# Patient Record
Sex: Female | Born: 1975
Health system: Southern US, Community
[De-identification: ages and names within clinical notes are randomized; demographics above are authoritative.]

## PROBLEM LIST (undated history)

## (undated) DIAGNOSIS — G43909 Migraine, unspecified, not intractable, without status migrainosus: Secondary | ICD-10-CM

## (undated) DIAGNOSIS — D219 Benign neoplasm of connective and other soft tissue, unspecified: Secondary | ICD-10-CM

## (undated) DIAGNOSIS — D649 Anemia, unspecified: Secondary | ICD-10-CM

## (undated) DIAGNOSIS — I10 Essential (primary) hypertension: Secondary | ICD-10-CM

## (undated) DIAGNOSIS — K219 Gastro-esophageal reflux disease without esophagitis: Secondary | ICD-10-CM

## (undated) HISTORY — DX: Gastro-esophageal reflux disease without esophagitis: K21.9

## (undated) HISTORY — PX: KNEE SURGERY: SHX244

## (undated) HISTORY — DX: Essential (primary) hypertension: I10

## (undated) HISTORY — DX: Anemia, unspecified: D64.9

## (undated) HISTORY — DX: Benign neoplasm of connective and other soft tissue, unspecified: D21.9

---

## 1997-12-02 ENCOUNTER — Encounter: Admission: RE | Admit: 1997-12-02 | Discharge: 1997-12-02 | Payer: Self-pay | Admitting: Family Medicine

## 1997-12-17 ENCOUNTER — Encounter: Admission: RE | Admit: 1997-12-17 | Discharge: 1997-12-17 | Payer: Self-pay | Admitting: Family Medicine

## 1998-01-02 ENCOUNTER — Encounter: Admission: RE | Admit: 1998-01-02 | Discharge: 1998-01-02 | Payer: Self-pay | Admitting: Family Medicine

## 1998-02-04 ENCOUNTER — Encounter: Admission: RE | Admit: 1998-02-04 | Discharge: 1998-02-04 | Payer: Self-pay | Admitting: Family Medicine

## 1998-02-06 ENCOUNTER — Encounter: Admission: RE | Admit: 1998-02-06 | Discharge: 1998-02-06 | Payer: Self-pay | Admitting: Family Medicine

## 1998-06-29 ENCOUNTER — Other Ambulatory Visit: Admission: RE | Admit: 1998-06-29 | Discharge: 1998-06-29 | Payer: Self-pay

## 1998-06-29 ENCOUNTER — Encounter: Admission: RE | Admit: 1998-06-29 | Discharge: 1998-06-29 | Payer: Self-pay | Admitting: Family Medicine

## 1998-07-09 ENCOUNTER — Encounter: Admission: RE | Admit: 1998-07-09 | Discharge: 1998-07-09 | Payer: Self-pay | Admitting: Sports Medicine

## 1998-10-30 ENCOUNTER — Emergency Department (HOSPITAL_COMMUNITY): Admission: EM | Admit: 1998-10-30 | Discharge: 1998-10-30 | Payer: Self-pay

## 1998-12-03 ENCOUNTER — Encounter: Admission: RE | Admit: 1998-12-03 | Discharge: 1998-12-03 | Payer: Self-pay | Admitting: Family Medicine

## 1998-12-24 ENCOUNTER — Encounter: Admission: RE | Admit: 1998-12-24 | Discharge: 1998-12-24 | Payer: Self-pay | Admitting: Family Medicine

## 1999-02-02 ENCOUNTER — Encounter: Admission: RE | Admit: 1999-02-02 | Discharge: 1999-02-02 | Payer: Self-pay | Admitting: Sports Medicine

## 1999-03-08 ENCOUNTER — Encounter: Admission: RE | Admit: 1999-03-08 | Discharge: 1999-03-08 | Payer: Self-pay | Admitting: Family Medicine

## 1999-03-10 ENCOUNTER — Encounter: Admission: RE | Admit: 1999-03-10 | Discharge: 1999-03-10 | Payer: Self-pay | Admitting: Family Medicine

## 1999-04-07 ENCOUNTER — Encounter: Admission: RE | Admit: 1999-04-07 | Discharge: 1999-04-07 | Payer: Self-pay | Admitting: Family Medicine

## 1999-04-14 ENCOUNTER — Encounter: Admission: RE | Admit: 1999-04-14 | Discharge: 1999-04-14 | Payer: Self-pay | Admitting: Family Medicine

## 1999-04-15 ENCOUNTER — Emergency Department (HOSPITAL_COMMUNITY): Admission: EM | Admit: 1999-04-15 | Discharge: 1999-04-15 | Payer: Self-pay | Admitting: Emergency Medicine

## 1999-04-28 ENCOUNTER — Encounter: Admission: RE | Admit: 1999-04-28 | Discharge: 1999-04-28 | Payer: Self-pay | Admitting: Family Medicine

## 2000-07-25 ENCOUNTER — Emergency Department (HOSPITAL_COMMUNITY): Admission: EM | Admit: 2000-07-25 | Discharge: 2000-07-25 | Payer: Self-pay | Admitting: Emergency Medicine

## 2000-11-24 ENCOUNTER — Emergency Department (HOSPITAL_COMMUNITY): Admission: EM | Admit: 2000-11-24 | Discharge: 2000-11-25 | Payer: Self-pay | Admitting: Emergency Medicine

## 2000-11-25 ENCOUNTER — Encounter: Payer: Self-pay | Admitting: Emergency Medicine

## 2000-12-13 ENCOUNTER — Emergency Department (HOSPITAL_COMMUNITY): Admission: EM | Admit: 2000-12-13 | Discharge: 2000-12-13 | Payer: Self-pay | Admitting: Emergency Medicine

## 2001-02-14 ENCOUNTER — Emergency Department (HOSPITAL_COMMUNITY): Admission: EM | Admit: 2001-02-14 | Discharge: 2001-02-14 | Payer: Self-pay | Admitting: Emergency Medicine

## 2001-02-23 ENCOUNTER — Encounter: Admission: RE | Admit: 2001-02-23 | Discharge: 2001-02-23 | Payer: Self-pay | Admitting: Family Medicine

## 2001-04-10 ENCOUNTER — Encounter: Admission: RE | Admit: 2001-04-10 | Discharge: 2001-04-10 | Payer: Self-pay | Admitting: Family Medicine

## 2001-04-26 ENCOUNTER — Encounter: Admission: RE | Admit: 2001-04-26 | Discharge: 2001-04-26 | Payer: Self-pay | Admitting: Family Medicine

## 2001-12-06 ENCOUNTER — Encounter: Admission: RE | Admit: 2001-12-06 | Discharge: 2001-12-06 | Payer: Self-pay | Admitting: Family Medicine

## 2003-01-06 ENCOUNTER — Emergency Department (HOSPITAL_COMMUNITY): Admission: EM | Admit: 2003-01-06 | Discharge: 2003-01-06 | Payer: Self-pay | Admitting: Emergency Medicine

## 2003-05-29 ENCOUNTER — Emergency Department (HOSPITAL_COMMUNITY): Admission: EM | Admit: 2003-05-29 | Discharge: 2003-05-29 | Payer: Self-pay | Admitting: Emergency Medicine

## 2003-10-19 ENCOUNTER — Emergency Department (HOSPITAL_COMMUNITY): Admission: EM | Admit: 2003-10-19 | Discharge: 2003-10-19 | Payer: Self-pay | Admitting: Emergency Medicine

## 2003-10-20 ENCOUNTER — Emergency Department (HOSPITAL_COMMUNITY): Admission: EM | Admit: 2003-10-20 | Discharge: 2003-10-20 | Payer: Self-pay | Admitting: Emergency Medicine

## 2004-01-10 ENCOUNTER — Emergency Department (HOSPITAL_COMMUNITY): Admission: EM | Admit: 2004-01-10 | Discharge: 2004-01-10 | Payer: Self-pay | Admitting: Emergency Medicine

## 2004-01-18 ENCOUNTER — Emergency Department (HOSPITAL_COMMUNITY): Admission: EM | Admit: 2004-01-18 | Discharge: 2004-01-18 | Payer: Self-pay | Admitting: Family Medicine

## 2004-01-21 ENCOUNTER — Encounter: Admission: RE | Admit: 2004-01-21 | Discharge: 2004-01-21 | Payer: Self-pay | Admitting: Family Medicine

## 2004-01-23 ENCOUNTER — Encounter: Admission: RE | Admit: 2004-01-23 | Discharge: 2004-01-23 | Payer: Self-pay | Admitting: Family Medicine

## 2004-02-13 ENCOUNTER — Emergency Department (HOSPITAL_COMMUNITY): Admission: EM | Admit: 2004-02-13 | Discharge: 2004-02-13 | Payer: Self-pay | Admitting: Family Medicine

## 2004-05-26 ENCOUNTER — Emergency Department (HOSPITAL_COMMUNITY): Admission: EM | Admit: 2004-05-26 | Discharge: 2004-05-26 | Payer: Self-pay | Admitting: Family Medicine

## 2004-06-07 ENCOUNTER — Emergency Department (HOSPITAL_COMMUNITY): Admission: EM | Admit: 2004-06-07 | Discharge: 2004-06-07 | Payer: Self-pay | Admitting: Family Medicine

## 2004-09-14 ENCOUNTER — Ambulatory Visit: Payer: Self-pay | Admitting: Family Medicine

## 2004-10-05 ENCOUNTER — Ambulatory Visit: Payer: Self-pay | Admitting: Sports Medicine

## 2004-10-09 ENCOUNTER — Emergency Department (HOSPITAL_COMMUNITY): Admission: EM | Admit: 2004-10-09 | Discharge: 2004-10-09 | Payer: Self-pay | Admitting: Family Medicine

## 2004-10-19 ENCOUNTER — Emergency Department (HOSPITAL_COMMUNITY): Admission: EM | Admit: 2004-10-19 | Discharge: 2004-10-19 | Payer: Self-pay | Admitting: Family Medicine

## 2004-11-25 ENCOUNTER — Ambulatory Visit: Payer: Self-pay | Admitting: Family Medicine

## 2004-12-29 ENCOUNTER — Ambulatory Visit: Payer: Self-pay | Admitting: Family Medicine

## 2005-01-20 ENCOUNTER — Ambulatory Visit: Payer: Self-pay | Admitting: Family Medicine

## 2005-02-12 ENCOUNTER — Emergency Department (HOSPITAL_COMMUNITY): Admission: EM | Admit: 2005-02-12 | Discharge: 2005-02-13 | Payer: Self-pay | Admitting: Emergency Medicine

## 2005-04-06 ENCOUNTER — Emergency Department (HOSPITAL_COMMUNITY): Admission: EM | Admit: 2005-04-06 | Discharge: 2005-04-06 | Payer: Self-pay | Admitting: Family Medicine

## 2005-04-12 ENCOUNTER — Ambulatory Visit: Payer: Self-pay | Admitting: Sports Medicine

## 2005-05-04 ENCOUNTER — Ambulatory Visit: Payer: Self-pay | Admitting: Family Medicine

## 2005-07-18 ENCOUNTER — Encounter (INDEPENDENT_AMBULATORY_CARE_PROVIDER_SITE_OTHER): Payer: Self-pay | Admitting: *Deleted

## 2005-07-26 ENCOUNTER — Emergency Department (HOSPITAL_COMMUNITY): Admission: EM | Admit: 2005-07-26 | Discharge: 2005-07-26 | Payer: Self-pay | Admitting: Family Medicine

## 2005-07-27 ENCOUNTER — Ambulatory Visit: Payer: Self-pay | Admitting: Family Medicine

## 2005-08-01 ENCOUNTER — Emergency Department (HOSPITAL_COMMUNITY): Admission: EM | Admit: 2005-08-01 | Discharge: 2005-08-01 | Payer: Self-pay | Admitting: Emergency Medicine

## 2005-08-09 ENCOUNTER — Ambulatory Visit: Payer: Self-pay | Admitting: Sports Medicine

## 2005-10-14 ENCOUNTER — Ambulatory Visit: Payer: Self-pay | Admitting: Sports Medicine

## 2005-10-18 ENCOUNTER — Ambulatory Visit: Payer: Self-pay | Admitting: Family Medicine

## 2005-10-18 ENCOUNTER — Ambulatory Visit (HOSPITAL_COMMUNITY): Admission: RE | Admit: 2005-10-18 | Discharge: 2005-10-18 | Payer: Self-pay | Admitting: Family Medicine

## 2005-11-18 ENCOUNTER — Emergency Department (HOSPITAL_COMMUNITY): Admission: EM | Admit: 2005-11-18 | Discharge: 2005-11-18 | Payer: Self-pay | Admitting: Family Medicine

## 2005-11-30 ENCOUNTER — Ambulatory Visit: Payer: Self-pay | Admitting: Family Medicine

## 2005-12-09 ENCOUNTER — Emergency Department (HOSPITAL_COMMUNITY): Admission: EM | Admit: 2005-12-09 | Discharge: 2005-12-09 | Payer: Self-pay | Admitting: Emergency Medicine

## 2006-05-11 ENCOUNTER — Ambulatory Visit: Payer: Self-pay | Admitting: Family Medicine

## 2006-05-25 ENCOUNTER — Ambulatory Visit: Payer: Self-pay | Admitting: Sports Medicine

## 2006-09-07 ENCOUNTER — Emergency Department (HOSPITAL_COMMUNITY): Admission: EM | Admit: 2006-09-07 | Discharge: 2006-09-07 | Payer: Self-pay | Admitting: Family Medicine

## 2006-09-08 ENCOUNTER — Emergency Department (HOSPITAL_COMMUNITY): Admission: EM | Admit: 2006-09-08 | Discharge: 2006-09-08 | Payer: Self-pay | Admitting: Emergency Medicine

## 2006-09-11 ENCOUNTER — Ambulatory Visit: Payer: Self-pay | Admitting: Family Medicine

## 2006-09-15 ENCOUNTER — Encounter (INDEPENDENT_AMBULATORY_CARE_PROVIDER_SITE_OTHER): Payer: Self-pay | Admitting: *Deleted

## 2006-10-09 ENCOUNTER — Encounter (INDEPENDENT_AMBULATORY_CARE_PROVIDER_SITE_OTHER): Payer: Self-pay | Admitting: Family Medicine

## 2006-10-09 ENCOUNTER — Telehealth: Payer: Self-pay | Admitting: *Deleted

## 2006-10-09 ENCOUNTER — Ambulatory Visit: Payer: Self-pay | Admitting: Family Medicine

## 2006-10-12 ENCOUNTER — Telehealth: Payer: Self-pay | Admitting: *Deleted

## 2006-10-16 ENCOUNTER — Telehealth: Payer: Self-pay | Admitting: *Deleted

## 2006-10-30 ENCOUNTER — Ambulatory Visit: Payer: Self-pay | Admitting: Family Medicine

## 2006-10-30 ENCOUNTER — Telehealth (INDEPENDENT_AMBULATORY_CARE_PROVIDER_SITE_OTHER): Payer: Self-pay | Admitting: *Deleted

## 2006-10-31 ENCOUNTER — Encounter (INDEPENDENT_AMBULATORY_CARE_PROVIDER_SITE_OTHER): Payer: Self-pay | Admitting: Family Medicine

## 2006-12-04 ENCOUNTER — Ambulatory Visit: Payer: Self-pay | Admitting: Family Medicine

## 2006-12-04 ENCOUNTER — Telehealth: Payer: Self-pay | Admitting: *Deleted

## 2006-12-04 ENCOUNTER — Encounter (INDEPENDENT_AMBULATORY_CARE_PROVIDER_SITE_OTHER): Payer: Self-pay | Admitting: Family Medicine

## 2007-01-25 ENCOUNTER — Emergency Department (HOSPITAL_COMMUNITY): Admission: EM | Admit: 2007-01-25 | Discharge: 2007-01-25 | Payer: Self-pay | Admitting: Emergency Medicine

## 2007-02-07 ENCOUNTER — Ambulatory Visit: Payer: Self-pay | Admitting: Family Medicine

## 2007-02-07 DIAGNOSIS — I10 Essential (primary) hypertension: Secondary | ICD-10-CM | POA: Insufficient documentation

## 2007-02-07 DIAGNOSIS — J4520 Mild intermittent asthma, uncomplicated: Secondary | ICD-10-CM | POA: Insufficient documentation

## 2007-03-29 ENCOUNTER — Telehealth (INDEPENDENT_AMBULATORY_CARE_PROVIDER_SITE_OTHER): Payer: Self-pay | Admitting: Family Medicine

## 2007-03-30 ENCOUNTER — Ambulatory Visit: Payer: Self-pay | Admitting: Family Medicine

## 2007-03-30 ENCOUNTER — Telehealth (INDEPENDENT_AMBULATORY_CARE_PROVIDER_SITE_OTHER): Payer: Self-pay | Admitting: *Deleted

## 2007-05-21 ENCOUNTER — Ambulatory Visit: Payer: Self-pay | Admitting: Family Medicine

## 2007-05-21 ENCOUNTER — Encounter (INDEPENDENT_AMBULATORY_CARE_PROVIDER_SITE_OTHER): Payer: Self-pay | Admitting: Family Medicine

## 2007-05-21 LAB — CONVERTED CEMR LAB
HCT: 41.4 % (ref 36.0–46.0)
MCV: 93 fL (ref 78.0–100.0)
RBC: 4.45 M/uL (ref 3.87–5.11)
RDW: 15.1 % — ABNORMAL HIGH (ref 11.5–14.0)

## 2007-05-22 ENCOUNTER — Encounter (INDEPENDENT_AMBULATORY_CARE_PROVIDER_SITE_OTHER): Payer: Self-pay | Admitting: Family Medicine

## 2007-05-31 ENCOUNTER — Emergency Department (HOSPITAL_COMMUNITY): Admission: EM | Admit: 2007-05-31 | Discharge: 2007-05-31 | Payer: Self-pay | Admitting: Emergency Medicine

## 2007-05-31 ENCOUNTER — Encounter: Payer: Self-pay | Admitting: Family Medicine

## 2007-05-31 ENCOUNTER — Ambulatory Visit: Payer: Self-pay | Admitting: Family Medicine

## 2007-06-05 ENCOUNTER — Telehealth: Payer: Self-pay | Admitting: *Deleted

## 2007-06-06 ENCOUNTER — Telehealth (INDEPENDENT_AMBULATORY_CARE_PROVIDER_SITE_OTHER): Payer: Self-pay | Admitting: *Deleted

## 2007-08-03 ENCOUNTER — Telehealth: Payer: Self-pay | Admitting: *Deleted

## 2007-08-10 ENCOUNTER — Telehealth: Payer: Self-pay | Admitting: *Deleted

## 2007-09-04 ENCOUNTER — Telehealth: Payer: Self-pay | Admitting: *Deleted

## 2007-09-05 ENCOUNTER — Telehealth (INDEPENDENT_AMBULATORY_CARE_PROVIDER_SITE_OTHER): Payer: Self-pay | Admitting: Family Medicine

## 2007-10-16 ENCOUNTER — Ambulatory Visit: Payer: Self-pay | Admitting: Family Medicine

## 2007-10-16 ENCOUNTER — Encounter (INDEPENDENT_AMBULATORY_CARE_PROVIDER_SITE_OTHER): Payer: Self-pay | Admitting: Family Medicine

## 2007-10-16 DIAGNOSIS — K219 Gastro-esophageal reflux disease without esophagitis: Secondary | ICD-10-CM | POA: Insufficient documentation

## 2007-10-16 DIAGNOSIS — G4700 Insomnia, unspecified: Secondary | ICD-10-CM | POA: Insufficient documentation

## 2007-10-16 LAB — CONVERTED CEMR LAB
Beta hcg, urine, semiquantitative: NEGATIVE
Chlamydia, DNA Probe: NEGATIVE
GC Probe Amp, Genital: NEGATIVE

## 2007-10-19 ENCOUNTER — Encounter (INDEPENDENT_AMBULATORY_CARE_PROVIDER_SITE_OTHER): Payer: Self-pay | Admitting: Family Medicine

## 2007-10-19 ENCOUNTER — Emergency Department (HOSPITAL_COMMUNITY): Admission: EM | Admit: 2007-10-19 | Discharge: 2007-10-19 | Payer: Self-pay | Admitting: Emergency Medicine

## 2007-10-23 ENCOUNTER — Telehealth: Payer: Self-pay | Admitting: *Deleted

## 2007-10-25 ENCOUNTER — Telehealth: Payer: Self-pay | Admitting: *Deleted

## 2007-11-06 ENCOUNTER — Encounter (INDEPENDENT_AMBULATORY_CARE_PROVIDER_SITE_OTHER): Payer: Self-pay | Admitting: Family Medicine

## 2007-11-06 ENCOUNTER — Telehealth (INDEPENDENT_AMBULATORY_CARE_PROVIDER_SITE_OTHER): Payer: Self-pay | Admitting: Family Medicine

## 2007-11-08 ENCOUNTER — Encounter: Payer: Self-pay | Admitting: *Deleted

## 2007-11-10 ENCOUNTER — Emergency Department (HOSPITAL_COMMUNITY): Admission: EM | Admit: 2007-11-10 | Discharge: 2007-11-11 | Payer: Self-pay | Admitting: Emergency Medicine

## 2007-12-04 ENCOUNTER — Ambulatory Visit: Payer: Self-pay | Admitting: Family Medicine

## 2007-12-06 ENCOUNTER — Encounter: Admission: RE | Admit: 2007-12-06 | Discharge: 2007-12-06 | Payer: Self-pay | Admitting: Sports Medicine

## 2007-12-06 ENCOUNTER — Ambulatory Visit: Payer: Self-pay | Admitting: Sports Medicine

## 2007-12-06 DIAGNOSIS — R87810 Cervical high risk human papillomavirus (HPV) DNA test positive: Secondary | ICD-10-CM | POA: Insufficient documentation

## 2007-12-07 ENCOUNTER — Ambulatory Visit: Payer: Self-pay | Admitting: Family Medicine

## 2007-12-11 ENCOUNTER — Encounter (INDEPENDENT_AMBULATORY_CARE_PROVIDER_SITE_OTHER): Payer: Self-pay | Admitting: *Deleted

## 2007-12-11 ENCOUNTER — Telehealth (INDEPENDENT_AMBULATORY_CARE_PROVIDER_SITE_OTHER): Payer: Self-pay | Admitting: *Deleted

## 2007-12-12 ENCOUNTER — Encounter (INDEPENDENT_AMBULATORY_CARE_PROVIDER_SITE_OTHER): Payer: Self-pay | Admitting: Family Medicine

## 2007-12-19 ENCOUNTER — Encounter (INDEPENDENT_AMBULATORY_CARE_PROVIDER_SITE_OTHER): Payer: Self-pay | Admitting: *Deleted

## 2007-12-19 ENCOUNTER — Telehealth (INDEPENDENT_AMBULATORY_CARE_PROVIDER_SITE_OTHER): Payer: Self-pay | Admitting: Family Medicine

## 2008-01-01 ENCOUNTER — Telehealth (INDEPENDENT_AMBULATORY_CARE_PROVIDER_SITE_OTHER): Payer: Self-pay | Admitting: Family Medicine

## 2008-03-13 ENCOUNTER — Ambulatory Visit: Payer: Self-pay | Admitting: Family Medicine

## 2008-04-02 ENCOUNTER — Telehealth (INDEPENDENT_AMBULATORY_CARE_PROVIDER_SITE_OTHER): Payer: Self-pay | Admitting: Family Medicine

## 2008-04-21 ENCOUNTER — Telehealth (INDEPENDENT_AMBULATORY_CARE_PROVIDER_SITE_OTHER): Payer: Self-pay | Admitting: *Deleted

## 2008-04-28 ENCOUNTER — Telehealth (INDEPENDENT_AMBULATORY_CARE_PROVIDER_SITE_OTHER): Payer: Self-pay | Admitting: Family Medicine

## 2008-05-05 ENCOUNTER — Telehealth: Payer: Self-pay | Admitting: *Deleted

## 2008-05-09 ENCOUNTER — Telehealth: Payer: Self-pay | Admitting: *Deleted

## 2008-05-20 ENCOUNTER — Telehealth: Payer: Self-pay | Admitting: *Deleted

## 2008-05-22 ENCOUNTER — Encounter: Payer: Self-pay | Admitting: Family Medicine

## 2008-05-22 ENCOUNTER — Ambulatory Visit: Payer: Self-pay | Admitting: Family Medicine

## 2008-05-22 ENCOUNTER — Telehealth: Payer: Self-pay | Admitting: *Deleted

## 2008-05-23 ENCOUNTER — Telehealth: Payer: Self-pay | Admitting: *Deleted

## 2008-07-14 ENCOUNTER — Telehealth: Payer: Self-pay | Admitting: *Deleted

## 2008-07-15 ENCOUNTER — Telehealth: Payer: Self-pay | Admitting: *Deleted

## 2008-08-14 ENCOUNTER — Encounter: Payer: Self-pay | Admitting: Family Medicine

## 2008-08-14 ENCOUNTER — Ambulatory Visit: Payer: Self-pay | Admitting: Family Medicine

## 2008-08-14 ENCOUNTER — Telehealth (INDEPENDENT_AMBULATORY_CARE_PROVIDER_SITE_OTHER): Payer: Self-pay | Admitting: Family Medicine

## 2008-08-14 LAB — CONVERTED CEMR LAB
Beta hcg, urine, semiquantitative: NEGATIVE
Chlamydia, DNA Probe: NEGATIVE
GC Probe Amp, Genital: NEGATIVE
HCT: 40.9 % (ref 36.0–46.0)
Hemoglobin: 13.9 g/dL (ref 12.0–15.0)
KOH Prep: NEGATIVE
MCHC: 34 g/dL (ref 30.0–36.0)
MCV: 91.5 fL (ref 78.0–100.0)
Platelets: 397 10*3/uL (ref 150–400)
RBC: 4.47 M/uL (ref 3.87–5.11)
RDW: 14 % (ref 11.5–15.5)
WBC: 8.2 10*3/uL (ref 4.0–10.5)

## 2008-10-02 ENCOUNTER — Ambulatory Visit: Payer: Self-pay | Admitting: Family Medicine

## 2008-11-04 ENCOUNTER — Telehealth: Payer: Self-pay | Admitting: Family Medicine

## 2008-11-04 ENCOUNTER — Ambulatory Visit: Payer: Self-pay | Admitting: Family Medicine

## 2008-11-24 ENCOUNTER — Ambulatory Visit: Payer: Self-pay | Admitting: Family Medicine

## 2008-11-24 LAB — CONVERTED CEMR LAB: Beta hcg, urine, semiquantitative: NEGATIVE

## 2009-01-27 ENCOUNTER — Ambulatory Visit: Payer: Self-pay | Admitting: Family Medicine

## 2009-01-27 ENCOUNTER — Encounter: Payer: Self-pay | Admitting: Family Medicine

## 2009-01-27 LAB — CONVERTED CEMR LAB
ALT: 14 units/L (ref 0–35)
AST: 15 units/L (ref 0–37)
Albumin: 4.5 g/dL (ref 3.5–5.2)
Alkaline Phosphatase: 38 units/L — ABNORMAL LOW (ref 39–117)
BUN: 12 mg/dL (ref 6–23)
Beta hcg, urine, semiquantitative: NEGATIVE
CO2: 21 meq/L (ref 19–32)
Calcium: 9.4 mg/dL (ref 8.4–10.5)
Chloride: 109 meq/L (ref 96–112)
Creatinine, Ser: 0.82 mg/dL (ref 0.40–1.20)
Glucose, Bld: 97 mg/dL (ref 70–99)
Hgb A1c MFr Bld: 5.1 %
Potassium: 4.2 meq/L (ref 3.5–5.3)
Sodium: 140 meq/L (ref 135–145)
Total Bilirubin: 0.3 mg/dL (ref 0.3–1.2)
Total Protein: 6.9 g/dL (ref 6.0–8.3)

## 2009-01-30 ENCOUNTER — Ambulatory Visit: Payer: Self-pay | Admitting: Family Medicine

## 2009-02-03 ENCOUNTER — Telehealth: Payer: Self-pay | Admitting: Family Medicine

## 2009-02-25 ENCOUNTER — Ambulatory Visit: Payer: Self-pay | Admitting: Family Medicine

## 2009-02-26 ENCOUNTER — Encounter: Payer: Self-pay | Admitting: Family Medicine

## 2009-02-27 ENCOUNTER — Ambulatory Visit (HOSPITAL_COMMUNITY): Admission: RE | Admit: 2009-02-27 | Discharge: 2009-02-27 | Payer: Self-pay | Admitting: Family Medicine

## 2009-02-27 ENCOUNTER — Ambulatory Visit: Payer: Self-pay | Admitting: Family Medicine

## 2009-02-27 ENCOUNTER — Telehealth (INDEPENDENT_AMBULATORY_CARE_PROVIDER_SITE_OTHER): Payer: Self-pay | Admitting: Family Medicine

## 2009-03-02 ENCOUNTER — Telehealth: Payer: Self-pay | Admitting: *Deleted

## 2009-03-13 ENCOUNTER — Ambulatory Visit: Payer: Self-pay | Admitting: Family Medicine

## 2009-04-25 ENCOUNTER — Emergency Department (HOSPITAL_COMMUNITY): Admission: EM | Admit: 2009-04-25 | Discharge: 2009-04-25 | Payer: Self-pay | Admitting: Emergency Medicine

## 2009-05-01 ENCOUNTER — Telehealth: Payer: Self-pay | Admitting: Family Medicine

## 2009-05-01 ENCOUNTER — Ambulatory Visit: Payer: Self-pay | Admitting: Family Medicine

## 2009-06-01 ENCOUNTER — Telehealth: Payer: Self-pay | Admitting: *Deleted

## 2009-06-03 ENCOUNTER — Telehealth: Payer: Self-pay | Admitting: Family Medicine

## 2009-08-05 ENCOUNTER — Ambulatory Visit: Payer: Self-pay | Admitting: Family Medicine

## 2009-08-05 LAB — CONVERTED CEMR LAB
Albumin: 4.8 g/dL (ref 3.5–5.2)
Alkaline Phosphatase: 50 units/L (ref 39–117)
BUN: 7 mg/dL (ref 6–23)
Creatinine, Ser: 0.8 mg/dL (ref 0.40–1.20)
Glucose, Bld: 86 mg/dL (ref 70–99)
LDL Cholesterol: 117 mg/dL — ABNORMAL HIGH (ref 0–99)
Magnesium: 1.9 mg/dL (ref 1.5–2.5)
Potassium: 4.3 meq/L (ref 3.5–5.3)
Triglycerides: 67 mg/dL (ref <150)
VLDL: 13 mg/dL (ref 0–40)

## 2009-08-06 ENCOUNTER — Encounter: Payer: Self-pay | Admitting: Family Medicine

## 2009-08-06 LAB — CONVERTED CEMR LAB: Total CK: 104 units/L (ref 7–177)

## 2009-09-09 ENCOUNTER — Telehealth: Payer: Self-pay | Admitting: Family Medicine

## 2009-10-28 ENCOUNTER — Telehealth: Payer: Self-pay | Admitting: Family Medicine

## 2009-10-28 ENCOUNTER — Ambulatory Visit: Payer: Self-pay | Admitting: Family Medicine

## 2010-02-03 ENCOUNTER — Ambulatory Visit: Payer: Self-pay | Admitting: Family Medicine

## 2010-02-03 ENCOUNTER — Encounter (INDEPENDENT_AMBULATORY_CARE_PROVIDER_SITE_OTHER): Payer: Self-pay | Admitting: Family Medicine

## 2010-02-03 ENCOUNTER — Telehealth: Payer: Self-pay | Admitting: Family Medicine

## 2010-02-04 ENCOUNTER — Encounter: Payer: Self-pay | Admitting: Family Medicine

## 2010-02-05 ENCOUNTER — Telehealth: Payer: Self-pay | Admitting: Family Medicine

## 2010-03-09 ENCOUNTER — Ambulatory Visit: Payer: Self-pay | Admitting: Family Medicine

## 2010-03-09 DIAGNOSIS — F17200 Nicotine dependence, unspecified, uncomplicated: Secondary | ICD-10-CM | POA: Insufficient documentation

## 2010-03-17 ENCOUNTER — Ambulatory Visit: Payer: Self-pay | Admitting: Family Medicine

## 2010-05-02 ENCOUNTER — Encounter: Payer: Self-pay | Admitting: Family Medicine

## 2010-05-20 ENCOUNTER — Ambulatory Visit: Payer: Self-pay | Admitting: Family Medicine

## 2010-05-21 ENCOUNTER — Telehealth: Payer: Self-pay | Admitting: Family Medicine

## 2010-06-18 ENCOUNTER — Telehealth: Payer: Self-pay | Admitting: Family Medicine

## 2010-06-29 ENCOUNTER — Telehealth: Payer: Self-pay | Admitting: Family Medicine

## 2010-07-19 ENCOUNTER — Emergency Department (HOSPITAL_COMMUNITY)
Admission: EM | Admit: 2010-07-19 | Discharge: 2010-07-19 | Payer: Self-pay | Source: Home / Self Care | Admitting: Emergency Medicine

## 2010-07-22 ENCOUNTER — Ambulatory Visit: Admit: 2010-07-22 | Payer: Self-pay

## 2010-07-22 ENCOUNTER — Emergency Department (HOSPITAL_COMMUNITY)
Admission: EM | Admit: 2010-07-22 | Discharge: 2010-07-22 | Payer: Self-pay | Source: Home / Self Care | Admitting: Emergency Medicine

## 2010-08-17 NOTE — Assessment & Plan Note (Signed)
Summary: recent SOB and tired/dlh   Vital Signs:  Patient profile:   35 year old female Height:      62 inches Weight:      157 pounds BMI:     28.82 O2 Sat:      99 % Temp:     98.5 degrees F oral Pulse rate:   84 / minute Pulse rhythm:   regular BP sitting:   138 / 80  (left arm)  Vitals Entered By: Modesta Messing LPN (January 27, 2009 2:06 PM) CC: c/o SOB and dizziness for one week and loss of appetite for one month.  c/o numbness in her legs intermittently. Is Patient Diabetic? No Pain Assessment Patient in pain? no        Primary Care Provider:  Helane Rima DO  CC:  c/o SOB and dizziness for one week and loss of appetite for one month.  c/o numbness in her legs intermittently.Marland Kitchen  History of Present Illness: Jessica Hale is a 35 year old female presenting with fatigue, SOB, dizziness x 1 week and intermittent numbness and tingling in her legs (chronic and previously worked up per the patient with no findings).  1. Fatigue: worse in the past few weeks, has been getting over a cold, states that she feels better now in terms of her cold, but still feels weak. She denies fever/chills, vomiting, abdominal pain, cough, wheeze, HA, CP, lower extremity swelling, dysuria. She does endorse some intermittent NB diarrhea and nausea which has kept her from eating much, she feels that she may be dehydrated. Her wife has been giving her Ensure and fluids and she has tolerated these well. The patient  admits that she has recently cut down on her marijuana use from 15 blunts daily to about 3 daily. She denies ETOH, tobacco, and other drugs.  2. Numbness/Tingling in Legs: chronic issue for patient, has been told that she has low potassium in the past and has tried eating bananas daily when symptoms flare, only works sometimes, no history of diabetes, no back pain, no jerking of legs, not worse when she is going to sleep.  3. Family planning: she is still trying to get pregnant. LMP ended 3 days  ago. She and her wife are interested in a fertility workup.   Habits & Providers  Exercise-Depression-Behavior     Drug Use: marijuanna     Drug Use Counseling: to stop  Current Medications (verified): 1)  Albuterol 90 Mcg/act Aers (Albuterol) .... Inhale 2 Puff Using Inhaler Every Four Hours 2)  Singulair 10 Mg Tabs (Montelukast Sodium) .... Take 1 Tablet By Mouth Once A Day 3)  Ambien 5 Mg Tabs (Zolpidem Tartrate) .... Take One By Mouth Each Night For Sleep. 4)  Metoprolol Tartrate 50 Mg Tabs (Metoprolol Tartrate) .... Take 1/2 Tab By Mouth Daily 5)  Hydromet 5-1.5 Mg/71ml Syrp (Hydrocodone-Homatropine) .... 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 Admits to MJ use daily.Drug Use:  marijuanna  Review of Systems       per HPI, otherwise negative  Physical Exam  General:  alert, well-developed, well-nourished, and well-hydrated.  vital signs reviewed. smells strongly of marijuana. Neck:  No deformities, masses, or tenderness noted. Lungs:  CTAB no wheeze. Heart:  RRR  Abdomen:  soft, non-tender, and normal bowel sounds.   Msk:  MAE x4 Extremities:  No pedal edema Skin:  Intact without suspicious lesions or rashes. Psych:  Oriented X3, memory intact for recent and remote, normally interactive, good eye contact, and not anxious appearing.     Impression & Recommendations:  Problem # 1:  FATIGUE (ICD-780.79) Assessment New Likely secondary to resolving cold in conjunction with MJ withdrawal. No RED FLAGS today. Will check UPreg. Previous TSH 2.361 in 2008, will recheck today. Some s/s c/w diabetes so will check for this as well. Orders: U Preg-[facility] (81025) Comp Met-[facility] ([id number]) [facility]- Est Level  3 (99213) [facility]- Est  Level 4 (14782)  Problem # 2:  NAUSEA (ICD-787.02) Assessment:  New See above. Orders: U Preg-[facility] (81025) A1C-[facility] (95621) [facility]- Est Level  3 (99213) [facility]- Est  Level 4 (30865)  Problem # 3:  MARIJUANA ABUSE (ICD-305.20) Assessment: New Advised patient to continue with tapering and stopping MJ use. Her fatigue, nausea, and diarrhea are likely withdrawl. Educated patient about this. Orders: [facility]- Est  Level 4 (99214)  Problem # 4:  ASTHMA, PERSISTENT, MODERATE (ICD-493.90) Assessment: Unchanged No wheezing today. Stop MJ use. Continue albuterol. Her updated medication list for this problem includes:    Albuterol 90 Mcg/act Aers (Albuterol) ..... Inhale 2 puff using inhaler every four hours    Singulair 10 Mg Tabs (Montelukast sodium) .Marland Kitchen... Take 1 tablet by mouth once a day  Orders: [facility]- Est  Level 4 (78469)  Problem # 5:  FAMILY PLANNING (ICD-V25.09) Assessment: Unchanged Patient has been pregnanct before. Advised patient to keep log book of menses and to use ovulation kits for the next few months to help with timing for conception. Follow up for work-up in 3-6 months if no success. Orders: [facility]- Est  Level 4 (99214)  Problem # 6:  DISTURBANCE OF SKIN SENSATION (ICD-782.0) Assessment: Unchanged Will check potassium level and evaluate patient for diabetes.  Complete Medication List: 1)  Albuterol 90 Mcg/act Aers (Albuterol) .... Inhale 2 puff using inhaler every four hours 2)  Singulair 10 Mg Tabs (Montelukast sodium) .... Take 1 tablet by mouth once a day 3)  Ambien 5 Mg Tabs (Zolpidem tartrate) .... Take one by mouth each night for sleep. 4)  Metoprolol Tartrate 50 Mg Tabs (Metoprolol tartrate) .... Take 1/2 tab by mouth daily 5)  Hydromet 5-1.5 Mg/87ml Syrp (Hydrocodone-homatropine) .... One teaspoonful three times a day as needed, 150 cc  Other Orders: Pulse Oximetry- [facility] (62952)  Patient Instructions: 1)  I am going to check some labs today. 2)  Stop smoking.  Laboratory Results   Urine Tests  Date/Time Received: January 27, 2009  2:41 PM  Date/Time Reported: January 27, 2009 3:09 PM     Urine HCG: negative Comments: ...........test performed by...........Marland KitchenTerese Door, CMA   Blood Tests   Date/Time Received: January 27, 2009 2:41 PM  Date/Time Reported: January 27, 2009 3:09 PM   HGBA1C: 5.1%   (Normal Range: Non-Diabetic - 3-6%   Control Diabetic - 6-8%)  Comments: ...........test performed by...........Marland KitchenTerese Door, CMA

## 2010-08-17 NOTE — Miscellaneous (Signed)
Summary: Work Form  Patient dropped off forms to be filled out for her work.  Please call her when they are ready to be picked up. Bradly Bienenstock  February 26, 2009 4:38 PM  form to pcp for completion. pt should be back in tomorrow to get TB test read.Golden Circle RN  February 26, 2009 4:43 PM  Form completed, but I would like to follow up on patient's marijuana use before I give it to her. Left message for her to call the clinic today so that I may speak with her about this.  Appended Document: Work Form Called patient re: marijuana use. Patient states that she quit.

## 2010-08-17 NOTE — Assessment & Plan Note (Signed)
Summary: fever/cold,df   Vital Signs:  Patient profile:   35 year old female Weight:      153 pounds Temp:     98.9 degrees F oral Pulse rate:   75 / minute BP sitting:   122 / 77  (left arm)  Vitals Entered By: Alphia Kava (March 13, 2009 10:46 AM) CC: sore throat,congestion Is Patient Diabetic? No   Primary Care Provider:  Niel Hummer  CC:  sore throat and congestion.  History of Present Illness: 35 y/o female with  1. Cold: x 1 week, started with runny nose which led to sore throat and mild cough, + malaise, + subjective fever, + nassal congestion, no ear pain, no HA, no dizziness, no N/V/D/abdominal pain, no documented fever, no chest congestion, dyspnea, or wheeze, cough is productive and clear. 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   Habits & Providers  Alcohol-Tobacco-Diet     Other Tobacco cigar  Current Medications (verified): 1)  Albuterol 90 Mcg/act Aers (Albuterol) .... Inhale 2 Puff Using Inhaler Every Four Hours 2)  Singulair 10 Mg Tabs (Montelukast Sodium) .... Take 1 Tablet By Mouth Once A Day 3)  Ambien 5 Mg Tabs (Zolpidem Tartrate) .... Take One By Mouth Each Night For Sleep. 4)  Metoprolol Tartrate 50 Mg Tabs (Metoprolol Tartrate) .... Take 1/2 Tab By Mouth Daily  Allergies (verified): No Known Drug Allergies  Review of Systems       per HPI, otherwise normal  Physical Exam  General:  Well-developed, well-nourished, in no acute distress; alert, appropriate and cooperative throughout examination. Vital signs reviewed. Head:  normocephalic and atraumatic.   Eyes:  vision grossly intact, pupils equal, pupils round, and pupils reactive to light.   Ears:  R ear normal and L ear normal.   Nose:  clear nasal discharge, frequent sniffing, no ttp along sinuses. Mouth:  PND, mucus tracks along OP with slight erythema. no exudates or petichiae. Lungs:  CTAB no wheeze. Heart:  RRR  Abdomen:  soft, non-tender, and normal bowel sounds.      Impression & Recommendations:  Problem # 1:  URI (ICD-465.9) Assessment New  Reassured that she has a normal URI. Sore throat and slight cough secondary to rhinitis. Rx symptomatic treatment. Encourage hand washing while working. Gave red flags for f/u.  Orders: [facility]- Est Level  3 (60454)  Complete Medication List: 1)  Albuterol 90 Mcg/act Aers (Albuterol) .... Inhale 2 puff using inhaler every four hours 2)  Singulair 10 Mg Tabs (Montelukast sodium) .... Take 1 tablet by mouth once a day 3)  Ambien 5 Mg Tabs (Zolpidem tartrate) .... Take one by mouth each night for sleep. 4)  Metoprolol Tartrate 50 Mg Tabs (Metoprolol tartrate) .... Take 1/2 tab by mouth daily  Patient Instructions: 1)  There is no specific treatment for the viruses that cause the common cold. Most treatments are aimed at relieving some of the symptoms of the cold, but do not shorten or cure the cold. Antibiotics are not useful for treating the common cold; antibiotics are only used to treat illnesses caused by bacteria, not viruses. 2)  The symptoms of a cold will resolve over time, even without any treatment. However, the symptoms can last for several weeks; a person with a cold can have morning coughing and nasal congestion or runny nose for up to two weeks after symptoms first develop. 3)  Sore throat and headache are best treated with a mild pain reliever such as acetaminophen (  Tylenol) or a non-steroidal anti-inflammatory agent such as ibuprofen or naproxen (Motrin or Aleve). You may also soothe your sore throat with hot tea. 4)  Hand washing is an essential and highly effective way to prevent the spread of infection.

## 2010-08-17 NOTE — Assessment & Plan Note (Signed)
Summary: dizziness/nausea/vomitting/bmc   Vital Signs:  Patient profile:   35 year old female Height:      62 inches Weight:      149 pounds BMI:     27.35 Temp:     98.4 degrees F oral Pulse rate:   76 / minute BP sitting:   141 / 96  (right arm) Cuff size:   regular  Vitals Entered By: Tessie Fass CMA (March 17, 2010 9:47 AM) CC: dizziness, nausea, vomiting, and diarrhea Is Patient Diabetic? No Pain Assessment Patient in pain? yes     Location: head Intensity: 6   Primary Care Provider:  Niel Hummer  CC:  dizziness, nausea, vomiting, and and diarrhea.  History of Present Illness: 35 yo F:  1. Dizziness: x 2 weeks, feels "off balance," was evaluated for this last week and Dx with OM, Tx with Amox. She endorses HA (intermittent, at temples and eyes, with some photo/phonophobia), nausea (with vomiting x 3 after eating), diarrhea (since taking the Abx), and "ears popping." She denies fever, chills, loss of hearing, weakness, numbness, confusion, abdominal pain.  She endorses smoking cigars "ocassionally," with the last time being yesterday. She is a lesbian and has not been sexually active with men. She has tried Motrin and Tylenol, which has helped her HA. Lying down still makes dizziness better.  Current Medications (verified): 1)  Albuterol 90 Mcg/act Aers (Albuterol) .... Inhale 2 Puff Using Inhaler Every Four Hours 2)  Ambien 5 Mg Tabs (Zolpidem Tartrate) .... Take One By Mouth Each Night For Sleep. 3)  Metoprolol Tartrate 50 Mg Tabs (Metoprolol Tartrate) .... Take 1 Tab By Mouth Two Times A Day  Allergies (verified): No Known Drug Allergies PMH-FH-SH reviewed for relevance  Review of Systems      See HPI  Physical Exam  General:  Well-developed, well-nourished, in no acute distress; alert, appropriate and cooperative throughout examination. Vitals reviewed. Head:  Normocephalic and atraumatic without obvious abnormalities.  Eyes:  PERRLA, EOMI. Ears:  R  ear normal and L ear normal.   Nose:  Nasal discharge, mucosal pallor.   Mouth:  PND. Neck:  No deformities, masses, or tenderness noted. Lungs:  Normal respiratory effort, chest expands symmetrically. Lungs are clear to auscultation, no crackles or wheezes. Heart:  Normal rate and regular rhythm. S1 and S2 normal without gallop, murmur, click, rub or other extra sounds. Neurologic:  Alert & oriented X3, cranial nerves II-XII intact, strength normal in all extremities, sensation intact to light touch, and gait normal.   Psych:  Oriented X3, memory intact for recent and remote, normally interactive, good eye contact, and not anxious appearing.     Impression & Recommendations:  Problem # 1:  DIZZINESS (ICD-780.4) Assessment Unchanged  c/w vertigo. NO RED FLAGS. Suspect that this is mulitfactorial. Will address vertigo with Antivert, will address headache with Ibuprofen, and address allergic rhinitis with Zrytec. Patient to follow up next week for re-eval. Her updated medication list for this problem includes:    Meclizine Hcl 25 Mg Tabs (Meclizine hcl) .Marland Kitchen... 1-2 tabs by mouth q 8 hours as needed for vertigo    Zyrtec Allergy 10 Mg Tabs (Cetirizine hcl) .Marland Kitchen... 1 once daily prn  Orders: [facility]- Est  Level 4 (16109)  Problem # 2:  ALLERGIC RHINITIS (ICD-477.9) Assessment: Unchanged  Her updated medication list for this problem includes:    Zyrtec Allergy 10 Mg Tabs (Cetirizine hcl) .Marland Kitchen... 1 once daily prn  Orders: [facility]- Est  Level 4 (60454)  Problem # 3:  HEADACHE (ICD-784.0) Assessment: New  Her updated medication list for this problem includes:    Metoprolol Tartrate 50 Mg Tabs (Metoprolol tartrate) .Marland Kitchen... Take 1 tab by mouth two times a day    Ibuprofen 800 Mg Tabs (Ibuprofen) ..... One by mouth up to three times a day as needed for headaches  Orders: [facility]- Est  Level 4 (16109)  Problem # 4:  TOBACCO ABUSE (ICD-305.1) Assessment: Unchanged  Encouraged to quit.  Orders: [facility]- Est   Level 4 (60454)  Problem # 5:  HYPERTENSION, BENIGN (ICD-401.1) Assessment: Unchanged  Will recheck BP next week after starting the Motrin. Her updated medication list for this problem includes:    Metoprolol Tartrate 50 Mg Tabs (Metoprolol tartrate) .Marland Kitchen... Take 1 tab by mouth two times a day  Orders: [facility]- Est  Level 4 (09811)  Complete Medication List: 1)  Albuterol 90 Mcg/act Aers (Albuterol) .... Inhale 2 puff using inhaler every four hours 2)  Ambien 5 Mg Tabs (Zolpidem tartrate) .... Take one by mouth each night for sleep. 3)  Metoprolol Tartrate 50 Mg Tabs (Metoprolol tartrate) .... Take 1 tab by mouth two times a day 4)  Ibuprofen 800 Mg Tabs (Ibuprofen) .... One by mouth up to three times a day as needed for headaches 5)  Meclizine Hcl 25 Mg Tabs (Meclizine hcl) .Marland Kitchen.. 1-2 tabs by mouth q 8 hours as needed for vertigo 6)  Zyrtec Allergy 10 Mg Tabs (Cetirizine hcl) .Marland Kitchen.. 1 once daily prn  Patient Instructions: 1)  It was nice to see you today. 2)  Your vertigo may take several weeks to go away. I have sent in a prescription called Antivert to help with the symptoms. 3)  We will treat your allergies with Zyrtec. 4)  Follow up in 1 week for recheck of symptoms and blood pressure. Prescriptions: ZYRTEC ALLERGY 10 MG  TABS (CETIRIZINE HCL) 1 once daily prn  #30 x 1   Entered and Authorized by:   Helane Rima DO   Signed by:   Helane Rima DO on 03/17/2010   Method used:   Electronically to        [facility] [phone]* (retail)       [address]       [place], [place]  [address]       Ph: [phone]       Fax: [phone]   RxID:   [id number] MECLIZINE HCL 25 MG TABS (MECLIZINE HCL) 1-2 tabs by mouth q 8 hours as needed for vertigo  #30 x 0   Entered and Authorized by:   Helane Rima DO   Signed by:   Helane Rima DO on 03/17/2010   Method used:   Electronically to        [facility] [phone]* (retail)       [address]       [place], [place]   [address]       Ph: [phone]       Fax: [phone]   RxID:   [id number] IBUPROFEN 800 MG TABS (IBUPROFEN) one by mouth up to three times a day as needed for headaches  #90 x 0   Entered and Authorized by:   Helane Rima DO   Signed by:   Helane Rima DO on 03/17/2010   Method used:   Electronically to        [facility] [phone]* (retail)       [phone]  [address]       [place], [place]  [address]       Ph: [phone]       Fax: [phone]   RxID:   [id number]   Prevention & Chronic Care Immunizations   Influenza vaccine: Fluvax Non-MCR  (08/05/2009)    Tetanus booster: 08/05/2009: Tdap   Tetanus booster due: 03/19/2007    Pneumococcal vaccine: Done.  (04/17/2001)   Pneumococcal vaccine due: None  Other Screening   Pap smear: Done.  (07/18/2005)   Pap smear due: 07/18/2006   Smoking status: current  (03/09/2010)   Smoking cessation counseling: yes  (08/14/2008)  Hypertension   Last Blood Pressure: 141 / 96  (03/17/2010)   Serum creatinine: 0.80  (08/05/2009)   Serum potassium 4.3  (08/05/2009)    Hypertension flowsheet reviewed?: Yes   Progress toward BP goal: Unchanged  Self-Management Support :   Personal Goals (by the next clinic visit) :      Personal blood pressure goal: 140/90  (03/17/2010)   Patient will work on the following items until the next clinic visit to reach self-care goals:     Medications and monitoring: take my medicines every day, bring all of my medications to every visit  (03/17/2010)     Eating: drink diet soda or water instead of juice or soda, eat more vegetables, use fresh or frozen vegetables, eat foods that are low in salt, eat baked foods instead of fried foods, eat fruit for snacks and desserts, limit or avoid alcohol  (03/17/2010)     Activity: take a 30 minute walk every day, take the stairs instead of the elevator, park at the far end of the parking lot  (03/17/2010)    Hypertension self-management support: Written  self-care plan  (03/17/2010)   Hypertension self-care plan printed.

## 2010-08-17 NOTE — Assessment & Plan Note (Signed)
Summary: heavy, painful period   Vital Signs:  Patient Profile:   35 Years Old Female Height:     62.5 inches Weight:      153 pounds Pulse rate:   80 / minute BP sitting:   120 / 70  (left arm)  Pt. in pain?   yes    Location:   pelvic    Intensity:   5  Vitals Entered By: Arlyss Repress CMA, (August 14, 2008 1:51 PM)              Is Patient Diabetic? No      PCP:  JESSICA TRICHE MD  Chief Complaint:  heavy period with cramping x 7 days. uses 6 pads daily.  History of Present Illness: Jessica Hale is 35 year old african [place] female presenting with vaginal bleeding x 7 days.  1. Vaginal Bleeding: Started 08/07/08, which was expected start day for menses, heavy, bright red and sometimes brown blood with many clots, using 6 pads daily. (Usual menses are at end of each month, last menses was during the third week in December, lasts 4-5 days, light). It is associated with pelvic, low back, and bilateral thigh pain described as moderate to severe cramping not relieved with OTC NSAIDS or heating pads. She denies fever/ chills, nausea, vomiting, diarrhea, constipation, headaches. Endorses mild dizziness. She is a lesbian, has a partner, wants a child, using a female friend to try to become pregnant. She has had a micarriage in the past (age 24) and feels that this feels similar.   Medications: Lisinopril 20/ HCTZ 12.5 mg daily, Ambien 5 mg at hs, Singulair 10 mg daily. Denies current usage of other medications.     Current Allergies: No known allergies   Past Medical History:    Chronic serous otitis    INSOMNIA, PERSISTENT (ICD-307.42)    GERD (ICD-530.81)    DEPRESSIVE DISORDER NOT ELSEWHERE CLASSIFIED (ICD-311)    MIGRAINE NOS W/O INTRACTABLE MIGRAINE (ICD-346.90)    HYPERTENSION, BENIGN (ICD-401.1)    ASTHMA, PERSISTENT, MODERATE (ICD-493.90)    Latent TB  referred to health department 11/2007    Spontaneous Abortion (Age 3)        Social History:    No tob, etoh,  or drugs, Works for [employer] and boxes; G1P0010; Lesbian with partner.   Risk Factors:     Counseled to quit/cut down tobacco use:  yes   Review of Systems  The patient denies fever, weight loss, chest pain, syncope, dyspnea on exertion, melena, hematochezia, severe indigestion/heartburn, hematuria, genital sores, and suspicious skin lesions.     Physical Exam  General:     alert, well-developed, well-nourished, and well-hydrated.   Eyes:     pupils equal, pupils round, and pupils reactive to light.   Mouth:     MMM. Neck:     No deformities, masses, or tenderness noted. Lungs:     Normal respiratory effort, chest expands symmetrically. Lungs are clear to auscultation, no crackles or wheezes. Heart:     Normal rate and regular rhythm. S1 and S2 normal without gallop, murmur, click, rub or other extra sounds. Abdomen:     soft, non-tender, normal bowel sounds, no distention, no masses, no guarding, and no rebound tenderness.    Pelvic Exam  Vulva:      normal appearance.   Vagina:      normal, no lesions, no masses.   Cervix:      normal, no lesions.  dark red blood  from cervix with clots. Uterus:      smooth, non-tender.   Adnexa:      normal.       Impression & Recommendations:  Problem # 1:  DYSMENORRHEA (ICD-625.3) History and PE consistent with possible spontaneous abortion. U preg negative (<20). Will check CBC for excessive blood loss. Patient is hemodynamically stable at this time with no fever or severe abdominal pain. Red flags given. No medical or surgical management at this time. Advised Ibuprofen 800 mg by mouth three times a day with food for pain. Will have patient follow up in one week to re-evaluate.   Orders: U Preg-[facility] (81025) CBC-[facility] (03474) Wet Prep- [facility] (25956) [facility]- Est  Level 4 (38756)   Problem # 2:  MENORRHAGIA (ICD-626.2) See #1.  Orders: Wet Prep- [facility] [phone]) [facility]- Est  Level 4 (51884)   Problem # 3:   SEXUALLY TRANSMITTED DISEASE, EXPOSURE TO (ICD-V01.6)  Orders: GC/Chlamydia-[facility] (87591/87491) HIV-[facility] ([id number]) RPR-[facility] ([id number]) [facility]- Est  Level 4 (22025)   Problem # 4:  FAMILY PLANNING (ICD-V25.09) Patient is actively trying become pregnant. Reviewed current medications, advised prenatal vitamins, stop smoking, no ETOH. Last pap 07/2005. Advised patient to wait at least 6 weeks after resolution of vaginal bleeding before trying to conceive. Orders: [facility]- Est  Level 4 (42706)   Problem # 5:  HYPERTENSION, BENIGN (ICD-401.1) ACE contraindicated in pregnancy. Will change to beta blocker as patient still wishing to become pregnant. Will monitor BP.  The following medications were removed from the medication list:    Lisinopril-hydrochlorothiazide 20-12.5 Mg Tabs (Lisinopril-hydrochlorothiazide) ..... One tab po daily  Her updated medication list for this problem includes:    Metoprolol Tartrate 50 Mg Tabs (Metoprolol tartrate) .Marland Kitchen... Take 1 tab by mouth daily  Orders: [facility]- Est  Level 4 (99214)   Complete Medication List: 1)  Advair Diskus 500-50 Mcg/dose Misc (Fluticasone-salmeterol) .... Inhale 1 puff as directed twice a day 2)  Albuterol 90 Mcg/act Aers (Albuterol) .... Inhale 2 puff using inhaler every four hours 3)  Singulair 10 Mg Tabs (Montelukast sodium) .... Take 1 tablet by mouth once a day 4)  Ambien 10 Mg Tabs (Zolpidem tartrate) .... Take 1/2 tablet by mouth daily.  may substitute 5)  Metoprolol Tartrate 50 Mg Tabs (Metoprolol tartrate) .... Take 1 tab by mouth daily   Patient Instructions: 1)  Please take Ibuprofen 800 mg three times daily WITH FOOD for pain. Continue to use heating pad. If pain or bleeding worsens or if you develop a fever, dizziness, fatigue please call the clinic or go to Emergency Department.  2)  Stop the Lisinopril if you are trying to get pregnant. Will will prescribe Metoprolol for your blood pressure. 3)  Start taking Prenatal  Vitamins. 4)  Please wait at least 6 weeks before trying to get pregnant again. 5)  Please follow up in 1 week.   Prescriptions: METOPROLOL TARTRATE 50 MG TABS (METOPROLOL TARTRATE) Take 1 tab by mouth daily  #30 x 1   Entered and Authorized by:   Helane Rima MD   Signed by:   Helane Rima MD on 08/14/2008   Method used:   Print then Give to Patient   RxID:   [id number]   Laboratory Results   Urine Tests  Date/Time Received: August 14, 2008 2:08 PM  Date/Time Reported: August 14, 2008 2:12 PM     Urine HCG: negative Comments: ..........test performed by.....Marland KitchenMarland KitchenDeseree Blount (SMA)  confirmed by...Marland KitchenMarland KitchenMarland Kitchen Bonnie A. [place], [place] (ASCP)  Date/Time Received: August 14, 2008 3:14 PM  Date/Time Reported: August 14, 2008 3:14 PM   [employer] Source: vag WBC/hpf: 1-5 Bacteria/hpf: 1+  Rods Clue cells/hpf: none Yeast/hpf: none Wet [place]: Negative Trichomonas/hpf: none Comments: many red blood cells.  ..........test performed by.....Marland KitchenMarland KitchenDeseree Blount (SMA)                 confirmed by...Marland KitchenMarland KitchenMarland Kitchen Bonnie A. [place], [place] (ASCP)

## 2010-08-17 NOTE — Progress Notes (Signed)
Summary: phn msg  Phone Note Call from Patient   Caller: Patient Summary of Call: Pt cancelled today for CPE due to being on menestration. Initial call taken by: Clydell Hakim,  September 09, 2009 9:17 AM

## 2010-08-17 NOTE — Letter (Signed)
Summary: Out of Work  [facility]  [address]   [place], [place] [address]   Phone: [phone]  Fax: [phone]    February 03, 2010   Employee:  Jessica Hale    To Whom It May Concern:  [name] was seen in our office on 7/20 and will have an appointment with another physician on 7/21.  Please excuse her from work for those dates.  If you need additional information, please feel free to contact our office.         Sincerely,    Jettie Pagan MD

## 2010-08-17 NOTE — Letter (Signed)
Summary: Out of Work  [facility]  [address]   [place], [place] [address]   Phone: [phone]  Fax: [phone]    August 05, 2009   Employee:  Jessica Hale    To Whom It May Concern:   For Medical reasons, please excuse the above named employee from work for the following dates:  Start:   August 05, 2009    End:   August 06, 2009  If you need additional information, please feel free to contact our office.         Sincerely,    Loralee Pacas CMA (AAMA)

## 2010-08-17 NOTE — Progress Notes (Signed)
Summary: triage  Phone Note Call from Patient Call back at Home Phone [phone]   Caller: Patient Summary of Call: Pt wondering if she can get an antibotic for her eye.  Seen this week for it, but still having a lot of trouble with it. Initial call taken by: Clydell Hakim,  February 05, 2010 10:39 AM  Follow-up for Phone Call        pt stated that the dr was going to give her a Rx but she did not for her eye, looked at document and notified Rx was sent to Pharm , notified to check with [facility] on [address], voiced understanding.  Follow-up by: Gladstone Pih,  February 05, 2010 10:40 AM

## 2010-08-17 NOTE — Miscellaneous (Signed)
Summary: Asthma Update - Intermittent     New Problems: ASTHMA, INTERMITTENT (ICD-493.90)   New Problems: ASTHMA, INTERMITTENT (ICD-493.90) 

## 2010-08-17 NOTE — Assessment & Plan Note (Signed)
Summary: eye swollen shut/[place]/wallace   Vital Signs:  Patient profile:   35 year old female Height:      62 inches Weight:      146 pounds BMI:     26.80 BSA:     1.67 Temp:     98.9 degrees F Pulse rate:   71 / minute BP sitting:   144 / 101  Vitals Entered By: Jone Baseman CMA (February 03, 2010 3:41 PM) CC: left eye swollen x 1 day Is Patient Diabetic? No Pain Assessment Patient in pain? yes     Location: left eye Intensity: 7   Primary Care Provider:  Niel Hummer  CC:  left eye swollen x 1 day.  History of Present Illness: Patient here for c/o L eyelid swelling, associated with pain and new onset blurry vision.  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  Throughout the day the swelling got worse, when she woke up this am, she states her eye was swollen shut with crust.  She also describes a sharp pain in her medial canthus and eyelid is very tender to touch.  She also states that today she started having blurry vision in the L eye.  No h/o trauma. No fevers, no h/o allergic rhinitis or sinusitis. She does have a h/o swimmer's ear, but ear exam today is normal. She also c/o mild L sided headache.  Vision screen: L      20/25 R      20/16 Both 20/16  BP elevated today 144/101.  Patient's Metoprolol was increased to two times a day in 07/2009.  She will need to see her PCP about adding additional medication.  Habits & Providers  Alcohol-Tobacco-Diet     Tobacco Status: current     Other Tobacco cigar     Other per week 14  Allergies: No Known Drug Allergies  Physical Exam  General:  Well-developed,well-nourished,in no acute distress; alert,appropriate and cooperative throughout examination Head:  Normocephalic and atraumatic without obvious abnormalities. No apparent alopecia or balding. Eyes:  EOMI. Perrla. L eyelid swollen and tender to touch, mildly erythematous, L conjunctivitis, no well defined mass.  No active  drainage Ears:  External ear exam shows no significant lesions or deformities.  Otoscopic examination reveals clear canals, tympanic membranes are intact bilaterally without bulging, retraction, inflammation or discharge. Hearing is grossly normal bilaterally. Nose:  External nasal examination shows no deformity or inflammation. Nasal mucosa are pink and moist without lesions or exudates. L nare with mildly edematous turbinates. Mouth:  Oral mucosa and oropharynx without lesions or exudates.  Teeth in good repair. Neck:  No deformities, masses, or tenderness noted.   Impression & Recommendations:  Problem # 1:  OTHER AND UNSPECIFIED CONJUNCTIVITIS (ICD-372.39)  Her updated medication list for this problem includes:    Polymyxin B-trimethoprim 10000-0.1 Unit/ml-% Soln (Polymyxin b-trimethoprim) ..... One gtt in each eye q3hrs  Orders: Ophthalmology Referral (Ophthalmology) [facility]- Est Level  3 (47829)  Problem # 2:  VISUAL IMPAIRMENT (ICD-368.10)  Orders: Ophthalmology Referral (Ophthalmology) [facility]- Est Level  3 (56213)  Complete Medication List: 1)  Albuterol 90 Mcg/act Aers (Albuterol) .... Inhale 2 puff using inhaler every four hours 2)  Ambien 5 Mg Tabs (Zolpidem tartrate) .... Take one by mouth each night for sleep. 3)  Metoprolol Tartrate 50 Mg Tabs (Metoprolol tartrate) .... Take 1/2 tab by mouth two times a day 4)  Promethazine Hcl 25 Mg Tabs (Promethazine hcl) .Marland KitchenMarland KitchenMarland Kitchen 1  tab by mouth q6h as needed for n/v 5)  Polymyxin B-trimethoprim 10000-0.1 Unit/ml-% Soln (Polymyxin b-trimethoprim) .... One gtt in each eye q3hrs Prescriptions: POLYMYXIN B-TRIMETHOPRIM 10000-0.1 UNIT/ML-% SOLN (POLYMYXIN B-TRIMETHOPRIM) one gtt in each eye q3hrs  #1 bottle x 0   Entered and Authorized by:   Jettie Pagan MD   Signed by:   Jettie Pagan MD on 02/03/2010   Method used:   Electronically to        [facility] [address] [phone]* (retail)       [address]       [place], [place]  [address]        Ph: [phone]       Fax: [phone]   RxID:   [id number]   Appended Document: eye swollen shut//wallace Patient seen at [facility]. Dx preseptal cellulitis on 02/04/10. Rx Augmentin 500 mg by mouth three times a day.

## 2010-08-17 NOTE — Progress Notes (Signed)
Summary: wi request  Phone Note Call from Patient Call back at Home Phone [phone]   Reason for Call: Talk to Nurse Summary of Call: wi request, eye swollen shut Initial call taken by: Knox Royalty,  February 03, 2010 10:12 AM  Follow-up for Phone Call        started feeling "like a little knot in eye" yesterday. now it is swollen shut. 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 cannot come until this pm. will see Dr. Claudius Sis at 3:15 Follow-up by: Golden Circle RN,  February 03, 2010 10:15 AM

## 2010-08-17 NOTE — Assessment & Plan Note (Signed)
Summary: bp/  dpg   Vital Signs:  Patient profile:   35 year old female Weight:      163.6 pounds Pulse (ortho):   86 / minute BP sitting:   137 / 90  (right arm)  Vitals Entered By: Arlyss Repress CMA, (October 02, 2008 3:08 PM) CC: diarrhea x 3 days. f/up HTN. verbal consent for video precepting given Is Patient Diabetic? No Pain Assessment Patient in pain? no        History of Present Illness: Jessica Hale is a pleasant 35 year old AA female presenting for follow up of BP s/p recent medication change, medication refills, and diarrhea x 3 days.  1. BP: Patient with PMHx HTN, was on Lisinopril 20/HCTZ 12.5, but is now trying to concieve. At this last visit on 08/14/08, switched to Metoprolol  50 mg daily. Today, BP 137/90. Patient admits that she stopped taking the Metoprolol weeks ago because she did not like the way that it made her feel (made her dizzy). Her "wife" has been taking Jessica Hale's BP daily, says that the systolic is normally in the 130s, unkown diastolic. Jessica Hale has also been eating healthfully, with lots of vegetables, decreased salt. She has been exercising regularly (walking), states low stress lifestyle right now.   2. Refills: Needs refill of Ambien, Singulair, and BP medication.  3. Diarrhea: Patient has had URI s/s x 1 week with chest congestion, nasal congestion, productive cough, and sore throat. She denies fever, wheeze, ear pain, headache, vision changes, dizziness. About 3 days ago, she developed N/V/D. Vomiting x 3. NBNB. Drinking Gatorade to replace. Diarrhea x 3 days, watery, green, no blood or mucus. + sick contacts with similar symptoms.  LMP 09/11/08.  Habits & Providers     Alcohol drinks/day: 0     Alcohol Counseling: not indicated; patient does not drink     Tobacco Status: quit < 6 months     Tobacco Counseling: to remain off tobacco products     Does Patient Exercise: yes     Exercise Counseling: not indicated; exercise is adequate     Type of  exercise: walking     Exercise (avg: min/session): 30-60     Times/week: 5     Have you felt down or hopeless? no     Have you felt little pleasure in things? no     Depression Counseling: not indicated; screening negative for depression  Allergies: No Known Drug Allergies  Social History:    Smoking Status:  quit < 6 months    Does Patient Exercise:  yes  Review of Systems General:  Denies chills, fever, and weight loss. ENT:  Complains of nasal congestion, postnasal drainage, and sore throat; denies difficulty swallowing, earache, and sinus pressure. Resp:  Complains of cough and sputum productive; denies chest pain with inspiration, coughing up blood, shortness of breath, and wheezing. GI:  Complains of diarrhea, nausea, and vomiting; denies bloody stools, dark tarry stools, loss of appetite, and vomiting blood.  Physical Exam  General:  alert, well-developed, well-nourished, and well-hydrated.   Head:  normocephalic and atraumatic.   Ears:  R ear normal and L ear normal.   Nose:  no external deformity, no nasal discharge, and no sinus percussion tenderness.   Mouth:  pharynx pink and moist.   Lungs:  Normal respiratory effort, chest expands symmetrically. Lungs are clear to auscultation, no crackles or wheezes. Heart:  Normal rate and regular rhythm. S1 and S2 normal without gallop, murmur, click, rub  or other extra sounds. Abdomen:  Bowel sounds positive,abdomen soft and non-tender without masses, organomegaly or hernias noted. Pulses:  R dorsalis pedis normal and L dorsalis pedis normal.   Skin:  Intact without suspicious lesions or rashes.   Impression & Recommendations:  Problem # 1:  HYPERTENSION, BENIGN (ICD-401.1) Assessment Deteriorated  Discussed importance of good BP control and limitation of medications that are safe during pregnancy. Advised 1/2 Metoprolol daily. Patient's partner to take patient's BP and pulse daily. If pulse < 60, patient to stop medication  and call office. If she cannot tolerate medication, patient instructed to call.  Her updated medication list for this problem includes:    Metoprolol Tartrate 50 Mg Tabs (Metoprolol tartrate) .Marland Kitchen... Take 1/2 tab by mouth daily  Orders: [facility]- Est  Level 4 (60454)  Problem # 2:  VIRAL GASTROENTERITIS (ICD-008.8) Assessment: New  Discussed viral nature. Symptomatic treatment. Given RED flags for return.  Orders: [facility]- Est  Level 4 (09811)  Problem # 3:  FAMILY PLANNING (ICD-V25.09) Assessment: Unchanged  Patient is in lesbian relationship, attempting to concieve with "female friend." Patient taking PNV. Denies ETOH, tobacco, drug use. Will follow.  Orders: [facility]- Est  Level 4 (99214)  Complete Medication List: 1)  Advair Diskus 500-50 Mcg/dose Misc (Fluticasone-salmeterol) .... Inhale 1 puff as directed twice a day 2)  Albuterol 90 Mcg/act Aers (Albuterol) .... Inhale 2 puff using inhaler every four hours 3)  Singulair 10 Mg Tabs (Montelukast sodium) .... Take 1 tablet by mouth once a day 4)  Ambien 5 Mg Tabs (Zolpidem tartrate) .... Take one by mouth each night for sleep. 5)  Metoprolol Tartrate 50 Mg Tabs (Metoprolol tartrate) .... Take 1/2 tab by mouth daily  Patient Instructions: 1)  Please schedule a follow-up appointment in 2 months.  2)  Please take 1/2 dose of Metoprolol. Have your wife scheck your blood pressure daily. Also, have her check your pulse. If it is less than 60, please call the clinic to let me know. 3)  The main problem with gastroentereritis is dehydration. Drink plenty of fluids and take solids as you feel better. If you are unable to keep anything down and/or you show signs of dehydration (dry cracked lips, lack of tears, not urinating, very sleepy) , call our office.  4)  The medication list was reviewed and reconciled.  All changed / newly prescribed medications were explained.  A complete medication list was provided to the patient / caregiver. 5)  The medication  list was reviewed and reconciled.  All changed / newly prescribed medications were explained.  A complete medication list was provided to the patient / caregiver. Prescriptions: AMBIEN 5 MG TABS (ZOLPIDEM TARTRATE) Take one by mouth each night for sleep.  #30 x 3   Entered and Authorized by:   Helane Rima MD   Signed by:   Helane Rima MD on 10/05/2008   Method used:   Printed then faxed to ...       [facility]  [address] [phone]* (retail)       [address]       [place], [place]  [address]       Ph: [phone]       Fax: [phone]   RxID:   [id number] METOPROLOL TARTRATE 50 MG TABS (METOPROLOL TARTRATE) Take 1/2 tab by mouth daily  #30 x 3   Entered and Authorized by:   Helane Rima MD   Signed by:   Helane Rima MD on 10/05/2008  Method used:   Electronically to        [facility] [address] [phone]* (retail)       [address]       [place], [place]  [address]       Ph: [phone]       Fax: [phone]   RxID:   [id number] SINGULAIR 10 MG TABS (MONTELUKAST SODIUM) Take 1 tablet by mouth once a day  #30 x 3   Entered and Authorized by:   Helane Rima MD   Signed by:   Helane Rima MD on 10/05/2008   Method used:   Electronically to        [facility] [address] [phone]* (retail)       [address]       [place], [place]  [address]       Ph: [phone]       Fax: [phone]   RxID:   [id number] ALBUTEROL 90 MCG/ACT AERS (ALBUTEROL) Inhale 2 puff using inhaler every four hours  #1 x 2   Entered and Authorized by:   Helane Rima MD   Signed by:   Helane Rima MD on 10/05/2008   Method used:   Electronically to        [facility] [address] [phone]* (retail)       [address]       [place], [place]  [address]       Ph: [phone]       Fax: [phone]   RxID:   [id number] ADVAIR DISKUS 500-50 MCG/DOSE MISC (FLUTICASONE-SALMETEROL) Inhale 1 puff as directed twice a day  #1 x 2   Entered and Authorized by:   Helane Rima MD    Signed by:   Helane Rima MD on 10/05/2008   Method used:   Electronically to        [facility] [address] [phone]* (retail)       [address]       [place], [place]  [address]       Ph: [phone]       Fax: [phone]   RxID:   [id number] SINGULAIR 10 MG TABS (MONTELUKAST SODIUM) Take 1 tablet by mouth once a day  #30 x 3   Entered and Authorized by:   Helane Rima MD   Signed by:   Helane Rima MD on 10/02/2008   Method used:   Print then Give to Patient   RxID:   [id number] ALBUTEROL 90 MCG/ACT AERS (ALBUTEROL) Inhale 2 puff using inhaler every four hours  #1 x 2   Entered and Authorized by:   Helane Rima MD   Signed by:   Helane Rima MD on 10/02/2008   Method used:   Print then Give to Patient   RxID:   [id number] ADVAIR DISKUS 500-50 MCG/DOSE MISC (FLUTICASONE-SALMETEROL) Inhale 1 puff as directed twice a day  #1 x 2   Entered and Authorized by:   Helane Rima MD   Signed by:   Helane Rima MD on 10/02/2008   Method used:   Print then Give to Patient   RxID:   [id number] METOPROLOL TARTRATE 50 MG TABS (METOPROLOL TARTRATE) Take 1/2 tab by mouth daily  #30 x 3   Entered and Authorized by:   Helane Rima MD   Signed by:   Helane Rima MD on 10/02/2008   Method used:   Print then Give to Patient   RxID:   [id number] AMBIEN 5 MG TABS (  ZOLPIDEM TARTRATE) Take one by mouth each night for sleep.  #30 x 3   Entered and Authorized by:   Helane Rima MD   Signed by:   Helane Rima MD on 10/02/2008   Method used:   Print then Give to Patient   RxID:   [id number]

## 2010-08-17 NOTE — Progress Notes (Signed)
Summary: test results  Phone Note Call from Patient Call back at [phone]   Caller: Patient Summary of Call: pt would like results of tests from last week Initial call taken by: De Nurse,  February 03, 2009 3:05 PM  Follow-up for Phone Call        will forward message to MD. Follow-up by: Theresia Lo RN,  February 03, 2009 3:39 PM  Additional Follow-up for Phone Call Additional follow up Details #1::        please let the patient know that her labs were normal including potassium and diabetes test. Additional Follow-up by: Helane Rima MD,  February 03, 2009 7:59 PM     Appended Document: test results pt informed

## 2010-08-17 NOTE — Assessment & Plan Note (Signed)
Summary: viral gastroenteritis   Vital Signs:  Patient profile:   35 year old female Weight:      142.4 pounds BMI:     26.14 Temp:     98.1 degrees F Pulse rate:   61 / minute BP sitting:   139 / 96  (right arm)  Vitals Entered By: Starleen Blue RN (October 28, 2009 11:05 AM) CC: diarrhea Is Patient Diabetic? No Pain Assessment Patient in pain? yes     Location: stomach   Primary Care Provider:  Niel Hummer  CC:  diarrhea.  History of Present Illness: 35yo F w/ GI symptoms  GI symptoms: x 3 days.  Course is improving.  Initially started with N/V (nonbloody, nonbilious) w/ subsequent diarrhea (nonbloody) and headache.  Number of episodes are improving.  States that she was started on Amoxicillin 1 week ago for tooth pain and works in a daycare.  States that she was able to eat for the first time last night and has been able to keep down fluids.  Has mild diffuse abd pain and denies any fevers.  Habits & Providers  Alcohol-Tobacco-Diet     Tobacco Status: current     Cigarette Packs/Day: <0.25  Current Medications (verified): 1)  Albuterol 90 Mcg/act Aers (Albuterol) .... Inhale 2 Puff Using Inhaler Every Four Hours 2)  Ambien 5 Mg Tabs (Zolpidem Tartrate) .... Take One By Mouth Each Night For Sleep. 3)  Metoprolol Tartrate 50 Mg Tabs (Metoprolol Tartrate) .... Take 1/2 Tab By Mouth Two Times A Day 4)  Zofran 4 Mg Tabs (Ondansetron Hcl) .Marland Kitchen.. 1 Tab By Mouth Every 6 Hours As Needed For Nausea and Vomiting  Allergies (verified): No Known Drug Allergies  Social History: Smoking Status:  current Packs/Day:  <0.25  Review of Systems      See HPI  Physical Exam  General:  VS Reviewed. Well appearing, NAD.  Mouth:  mildy dry mucus membranes Neck:  supple, full ROM, no goiter or mass  Lungs:  Normal respiratory effort, chest expands symmetrically. Lungs are clear to auscultation, no crackles or wheezes. Heart:  Normal rate and regular rhythm. S1 and S2 normal  without gallop, murmur, click, rub or other extra sounds. Abdomen:  Soft, mildy ttp diffusely, ND, no HSM, active BS no rebound or guarding  Neurologic:  A&O x3 Skin:  nl color and turgor   Impression & Recommendations:  Problem # 1:  VIRAL GASTROENTERITIS (ICD-008.8) Assessment Improved  Hx and exam c/w viral gastroenteritis without dehydration not meeting criteria for hospital admission. Supportive care with f/u as needed if symptoms worsen. Low suspicion for adverse rxn to amoxicillin.  Orders: [facility]- Est Level  3 ([address])  Complete Medication List: 1)  Albuterol 90 Mcg/act Aers (Albuterol) .... Inhale 2 puff using inhaler every four hours 2)  Ambien 5 Mg Tabs (Zolpidem tartrate) .... Take one by mouth each night for sleep. 3)  Metoprolol Tartrate 50 Mg Tabs (Metoprolol tartrate) .... Take 1/2 tab by mouth two times a day 4)  Zofran 4 Mg Tabs (Ondansetron hcl) .Marland Kitchen.. 1 tab by mouth every 6 hours as needed for nausea and vomiting  Patient Instructions: 1)  Please schedule a follow-up appointment as needed if symptoms worsen. 2)  I'm giving you a medication called Zofran for nausea and vomiting if you need it. 3)  Double the amount of fluids you are drinking. 4)  Don't go back to work until your symptoms are completely resolved. Prescriptions: ZOFRAN 4 MG TABS (ONDANSETRON HCL) 1  tab by mouth every 6 hours as needed for nausea and vomiting  #16 x 0   Entered and Authorized by:   Marisue Ivan  MD   Signed by:   Marisue Ivan  MD on 10/28/2009   Method used:   Print then Give to Patient   RxID:   [id number]

## 2010-08-17 NOTE — Progress Notes (Signed)
Summary: Refills  Phone Note Refill Request Call back at [phone]   Refills Requested: Medication #1:  METOPROLOL TARTRATE 50 MG TABS Take 1 tab by mouth two times a day  Medication #2:  AMBIEN 5 MG TABS Take one by mouth each night for sleep.  Medication #3:  LISINOPRIL 20 MG TABS 1 tablet by mouth daily.  Medication #4:  ALBUTEROL 90 MCG/ACT AERS Inhale 2 puff using inhaler every four hours [BMN] pt wants to switch pharmacies so she can get them cheaper. pt would like them sent to [facility]   Initial call taken by: Knox Royalty,  June 18, 2010 11:24 AM    Prescriptions: AMBIEN 5 MG TABS (ZOLPIDEM TARTRATE) Take one by mouth each night for sleep.  #30 x 3   Entered and Authorized by:   Helane Rima DO   Signed by:   Helane Rima DO on 06/18/2010   Method used:   Printed then faxed to ...       [facility]       [address]       [place], [place]  [address]       Ph: [phone]       Fax: [phone]   RxID:   [id number] ALBUTEROL 90 MCG/ACT AERS (ALBUTEROL) Inhale 2 puff using inhaler every four hours Brand medically necessary #1 x 6   Entered and Authorized by:   Helane Rima DO   Signed by:   Helane Rima DO on 06/18/2010   Method used:   Electronically to        [facility] (retail)       [address]       [place], [place]  [address]       Ph: [phone]       Fax: [phone]   RxID:   [id number] LISINOPRIL 20 MG TABS (LISINOPRIL) 1 tablet by mouth daily  #90 x 3   Entered and Authorized by:   Helane Rima DO   Signed by:   Helane Rima DO on 06/18/2010   Method used:   Electronically to        [facility] (retail)       [address]       [place], [place]  [address]       Ph: [phone]       Fax: [phone]   RxID:   [id number] METOPROLOL TARTRATE 50 MG TABS (METOPROLOL TARTRATE) Take 1 tab by mouth two times a  day  #60 x 1   Entered and Authorized by:   Helane Rima DO   Signed by:   Helane Rima DO on 06/18/2010   Method used:   Electronically to        [facility] (retail)       [address]       [place], [place]  [address]       Ph: [phone]       Fax: [phone]   RxID:   [id number]

## 2010-08-17 NOTE — Assessment & Plan Note (Signed)
Summary: pain in legs,tcb   Vital Signs:  Patient profile:   35 year old female Weight:      146.9 pounds Temp:     98.9 degrees F oral Pulse rate:   96 / minute Pulse rhythm:   regular BP sitting:   155 / 94  (left arm) Cuff size:   regular  Vitals Entered By: Loralee Pacas CMA (August 05, 2009 3:14 PM)  Primary Care Provider:  Niel Hummer   History of Present Illness: Leg pain  Chronic intermitant.  Seems to be in muscles, not joints.  Bilateral calves.  Not so much in thighs. Hx of being related to low Potassium.  Not on meds.  Not worse with exercise.  No back pain  Also has strange eating hx.  She feels full and won't eat for 1-2 days, then binges.  No purging.  Slow wt loss over the past two years is noted.  Current Medications (verified): 1)  Albuterol 90 Mcg/act Aers (Albuterol) .... Inhale 2 Puff Using Inhaler Every Four Hours 2)  Ambien 5 Mg Tabs (Zolpidem Tartrate) .... Take One By Mouth Each Night For Sleep. 3)  Metoprolol Tartrate 50 Mg Tabs (Metoprolol Tartrate) .... Take 1/2 Tab By Mouth Two Times A Day  Allergies (verified): No Known Drug Allergies  Past History:  Past medical, surgical, family and social histories (including risk factors) reviewed, and no changes noted (except as noted below).  Past Medical History: Reviewed history from 08/14/2008 and no changes required. Chronic serous otitis INSOMNIA, PERSISTENT (ICD-307.42) GERD (ICD-530.81) DEPRESSIVE DISORDER NOT ELSEWHERE CLASSIFIED (ICD-311) MIGRAINE NOS W/O INTRACTABLE MIGRAINE (ICD-346.90) HYPERTENSION, BENIGN (ICD-401.1) ASTHMA, PERSISTENT, MODERATE (ICD-493.90) Latent TB  referred to health department 11/2007 Spontaneous Abortion (Age 37)    Past Surgical History: Reviewed history from 12/04/2006 and no changes required. knee surgery at age 15  Family History: Reviewed history from 05/31/2007 and no changes required. DM - GMA, Uncle, Mom and Aunt, Gma with MI and CABG and stroke,  No knowledge of dad side of family  Mom with CAD prior to age 58  Social History: Reviewed history from 01/27/2009 and no changes required. No tob, ETOH, Works for [employer] and boxes;  Lesbian with partner - trying to get pregnant. Admits to MJ use daily.  Physical Exam  General:  Well-developed,well-nourished,in no acute distress; alert,appropriate and cooperative throughout examination Abdomen:  Bowel sounds positive,abdomen soft and non-tender without masses, organomegaly or hernias noted. Msk:  No deformity or scoliosis noted of thoracic or lumbar spine.   Pulses:  R and L dorsalis pedis and posterior tibial pulses are full and equal bilaterally Extremities:  No clubbing, cyanosis, edema, or deformity noted with normal full range of motion of all joints.     Impression & Recommendations:  Problem # 1:  LEG PAIN, BILATERAL (ICD-729.5) Unclear cause.  Given hx, will check K+ Orders: TSH-[facility] ([id number]) Magnesium-[facility] ([address]) [facility]- Est  Level 4 ([address])  Problem # 2:  HYPERTENSION, BENIGN (ICD-401.1) Assessment: Deteriorated She was only taking metoprolol once a day, increased to two times a day  Her updated medication list for this problem includes:    Metoprolol Tartrate 50 Mg Tabs (Metoprolol tartrate) .Marland Kitchen... Take 1/2 tab by mouth two times a day  Orders: Comp Met-[facility] ([id number]) Lipid-[facility] ([address]-57[address]) [facility]- Est  Level 4 ([address])  Problem # 3:  ABDOMINAL PAIN (ICD-789.00)  Not so much pain as intermitant fullness.  Will check labs.  Orders: [facility]- Est  Level 4 ([address])  Problem #  4:  Preventive Health Care (ICD-V70.0) update immunizations and check lipid panel  Complete Medication List: 1)  Albuterol 90 Mcg/act Aers (Albuterol) .... Inhale 2 puff using inhaler every four hours 2)  Ambien 5 Mg Tabs (Zolpidem tartrate) .... Take one by mouth each night for sleep. 3)  Metoprolol Tartrate 50 Mg Tabs (Metoprolol tartrate) .... Take 1/2 tab by  mouth two times a day  Other Orders: Influenza Vaccine NON MCR ([address]) Tdap => 67yrs IM ([address]) Admin 1st Vaccine ([address]) Prescriptions: METOPROLOL TARTRATE 50 MG TABS (METOPROLOL TARTRATE) Take 1/2 tab by mouth two times a day  #30 x 12   Entered and Authorized by:   Doralee Albino MD   Signed by:   Doralee Albino MD on 08/05/2009   Method used:   Electronically to        [facility] [phone]* (retail)       [address]       [place], [place]  [address]       Ph: [phone]       Fax: [phone]   RxID:   [id number]    Immunizations Administered:  Influenza Vaccine # 1:    Vaccine Type: Fluvax Non-MCR    Site: right deltoid    Mfr: GlaxoSmithKline    Dose: 0.5 ml    Route: IM    Given by: Loralee Pacas CMA    Exp. Date: 01/14/2010    Lot #: [id number]    VIS given: 02/24/2009  Tetanus Vaccine:    Vaccine Type: Tdap    Site: right deltoid    Mfr: GlaxoSmithKline    Dose: 0.5 ml    Route: IM    Given by: Loralee Pacas CMA    Exp. Date: 09/12/2011    Lot #: [id number]    VIS given: 06/05/07 version given August 05, 2009.  Flu Vaccine Consent Questions:    Do you have a history of severe allergic reactions to this vaccine? no    Any prior history of allergic reactions to egg and/or gelatin? no    Do you have a sensitivity to the preservative Thimersol? no    Do you have a past history of Guillan-Barre Syndrome? no    Do you currently have an acute febrile illness? no    Have you ever had a severe reaction to latex? no    Vaccine information given and explained to patient? yes    Are you currently pregnant? no   Prevention & Chronic Care Immunizations   Influenza vaccine: Fluvax Non-MCR  (08/05/2009)    Tetanus booster: 08/05/2009: Tdap   Tetanus booster due: 03/19/2007    Pneumococcal vaccine: Done.  (04/17/2001)   Pneumococcal vaccine due: None  Other Screening   Pap smear: Done.  (07/18/2005)   Pap smear due: 07/18/2006   Smoking  status: quit < 6 months  (10/02/2008)  Hypertension   Last Blood Pressure: 155 / 94  (08/05/2009)   Serum creatinine: 0.82  (01/27/2009)   Serum potassium 4.2  (01/27/2009) CMP ordered   Self-Management Support :    Hypertension self-management support: Not documented   Nursing Instructions: Give tetanus booster today    Appended Document: Orders Update - CK    Clinical Lists Changes  Orders: Added new Test order of CK (Creatine Kinase)-[facility] [phone]) - Signed

## 2010-08-17 NOTE — Progress Notes (Signed)
Summary: Phenergan 25mg  q6 prn #30 x0  Phone Note Call from Patient Call back at [facility] Phone [phone]   Caller: Patient Summary of Call: The rx that was sent in for her today is to expensive.  Can something else be called in for her.  [facility] [address] Initial call taken by: Clydell Hakim,  October 28, 2009 3:45 PM  Follow-up for Phone Call        will forward message to MD. Follow-up by: Theresia Lo RN,  October 28, 2009 3:46 PM    New/Updated Medications: PROMETHAZINE HCL 25 MG TABS (PROMETHAZINE HCL) 1 tab by mouth q6h as needed for N/V Prescriptions: PROMETHAZINE HCL 25 MG TABS (PROMETHAZINE HCL) 1 tab by mouth q6h as needed for N/V  #30 x 0   Entered and Authorized by:   Marisue Ivan  MD   Signed by:   Marisue Ivan  MD on 10/28/2009   Method used:   Telephoned to ...       [facility]  [address] [phone]* (retail)       [address]       [place], [place]  [address]       Ph: [phone]       Fax: [phone]   RxID:   [id number]

## 2010-08-17 NOTE — Assessment & Plan Note (Signed)
Summary: dizzy spells,df   Vital Signs:  Patient profile:   35 year old female Weight:      151.2 pounds Temp:     98.7 degrees F oral Pulse rate:   72 / minute Pulse (ortho):   72 / minute BP sitting:   148 / 97  (left arm) BP standing:   142 / 90  Vitals Entered By: Arlyss Repress CMA, (March 09, 2010 10:24 AM)  Serial Vital Signs/Assessments:  Time      Position  BP       Pulse  Resp  Temp     By 10:27 AM  Lying LA  140/90   72                    Thekla Slade CMA, 10:27 AM  Sitting   140/90                         Arlyss Repress CMA, 10:27 AM  Standing  142/90   72                    Thekla Slade CMA,  CC: feels off balance x 5 days. bilateral ear pain x 5 days. left ear worse today Is Patient Diabetic? No Pain Assessment Patient in pain? yes     Location: left ear Intensity: 4 Onset of pain  feels like a heart beat, when pain starts   Primary Care Antonette Hendricks:  Niel Hummer  CC:  feels off balance x 5 days. bilateral ear pain x 5 days. left ear worse today.  History of Present Illness: 35 yo here for 5 day history of bilateral ear pain L>R and "feel off balance"  Has been putting sweet oil with cotton balls in her ears.  No fever, chills, rhionorrhea, n/v.  No loss of hearing, weakness, numbness, confusion.    Habits & Providers  Alcohol-Tobacco-Diet     Tobacco Status: current     Tobacco Counseling: to quit use of tobacco products  Comments: smokes cigars/ 7 per 1 1/2 weeks.  Current Medications (verified): 1)  Albuterol 90 Mcg/act Aers (Albuterol) .... Inhale 2 Puff Using Inhaler Every Four Hours 2)  Ambien 5 Mg Tabs (Zolpidem Tartrate) .... Take One By Mouth Each Night For Sleep. 3)  Metoprolol Tartrate 50 Mg Tabs (Metoprolol Tartrate) .... Take 1 Tab By Mouth Two Times A Day 4)  Amoxicillin 500 Mg Caps (Amoxicillin) .... Take One Tablet Twice A Day For 5 Days  Allergies: No Known Drug Allergies PMH-FH-SH reviewed-no changes except otherwise  noted  Review of Systems      See HPI  Physical Exam  General:  Well-developed,well-nourished,in no acute distress; alert,appropriate and cooperative throughout examination Head:  Normocephalic and atraumatic without obvious abnormalities. No apparent alopecia or balding. Eyes:  PERRLA, EOMI Ears:  bilateral ears with opaque TM's and mild erythema.  No bulge, retraction, perforation.  canal normal.   Lungs:  Normal respiratory effort, chest expands symmetrically. Lungs are clear to auscultation, no crackles or wheezes. Heart:  Normal rate and regular rhythm. S1 and S2 normal without gallop, murmur, click, rub or other extra sounds. Neurologic:  alert & oriented X3, cranial nerves II-XII intact, strength normal in all extremities, sensation intact to light touch, and gait normal.     Impression & Recommendations:  Problem # 1:  DIZZINESS (ICD-780.4)  patient has had this before with unclear etiology- see melissa love's note.  Most  likely acute otitis media today.  WIll treat and if no resolution  of dizziness, would also consider migraine as other possible etiology.  No nystagmus.  Does not seem to be BPPV or Meneires but will need to re-evaluate once AOM resolved  Orders: [facility]- Est  Level 4 ([address])  Problem # 2:  OTITIS MEDIA, ACUTE (ICD-382.9)  Will treat with 5 day course of amoxicillin  Her updated medication list for this problem includes:    Amoxicillin 500 Mg Caps (Amoxicillin) .Marland Kitchen... Take one tablet twice a day for 5 days  Orders: [facility]- Est  Level 4 ([address])  Problem # 3:  HYPERTENSION, BENIGN (ICD-401.1)  Above goal.  Has only been taking 1 tab once a day but says was above goal even when taking 1/2 tab twice a day.  Will increase to 50 mg by mouth two times a day.  Follow-up with PCP in 2-3 weeks.  Her updated medication list for this problem includes:    Metoprolol Tartrate 50 Mg Tabs (Metoprolol tartrate) .Marland Kitchen... Take 1 tab by mouth two times a day  Orders: [facility]- Est   Level 4 ([address])  Problem # 4:  TOBACCO ABUSE (ICD-305.1)  Discusse connection between smoking and ear infections/colds.  Given info on quitline and classes offered here.  Orders: [facility]- Est  Level 4 ([address])  Complete Medication List: 1)  Albuterol 90 Mcg/act Aers (Albuterol) .... Inhale 2 puff using inhaler every four hours 2)  Ambien 5 Mg Tabs (Zolpidem tartrate) .... Take one by mouth each night for sleep. 3)  Metoprolol Tartrate 50 Mg Tabs (Metoprolol tartrate) .... Take 1 tab by mouth two times a day 4)  Amoxicillin 500 Mg Caps (Amoxicillin) .... Take one tablet twice a day for 5 days  Patient Instructions: 1)  Finish all antibiotics even if you feel better 2)  I would avoid putting anything in your ears- they can cause infection 3)  take metoprolol- 1 tablet twice a day.  I have sent in a refill to your pharmacy. 4)  Make follow-up with Dr. Earlene Plater in 2-3 weeks. 5)  See info on smoking resoruces- we would love to talk to you more about quitting when you're ready Prescriptions: METOPROLOL TARTRATE 50 MG TABS (METOPROLOL TARTRATE) Take 1 tab by mouth two times a day  #60 x 1   Entered and Authorized by:   Delbert Harness MD   Signed by:   Delbert Harness MD on 03/09/2010   Method used:   Electronically to        [facility] [phone]* (retail)       [address]       [place], [place]  [address]       Ph: [phone]       Fax: [phone]   RxID:   [id number] AMOXICILLIN 500 MG CAPS (AMOXICILLIN) take one tablet twice a day for 5 days  #10 x 0   Entered and Authorized by:   Delbert Harness MD   Signed by:   Delbert Harness MD on 03/09/2010   Method used:   Electronically to        [facility] [phone]* (retail)       [address]       [place], [place]  [address]       Ph: [phone]       Fax: [phone]   RxID:   [id number]

## 2010-08-17 NOTE — Progress Notes (Signed)
Summary: triage  Phone Note Call from Patient Call back at Home Phone [phone]   Caller: Patient Summary of Call: cold and neck pain -went to ER 4 days ago and is still not better. Initial call taken by: De Nurse,  May 01, 2009 9:38 AM  Follow-up for Phone Call        went to ED for neck pain. told it was inflamed. taking vicodin & valium with little results. limits her self to night use since she has to work. uses motrin during the day. also has a cold which they told her might be making the neck pain worse. work in at 11 to address pain. she is c/o severe pain Follow-up by: Golden Circle RN,  May 01, 2009 9:47 AM

## 2010-08-17 NOTE — Progress Notes (Signed)
Summary: refill  Phone Note Refill Request Call back at Home Phone [phone] Message from:  Patient  Refills Requested: Medication #1:  ALBUTEROL 90 MCG/ACT AERS Inhale 2 puff using inhaler every four hours [BMN]  Medication #2:  ZYRTEC ALLERGY 10 MG  TABS 1 once daily prn pt needs these called in  Initial call taken by: De Nurse,  May 21, 2010 11:46 AM    Prescriptions: ZYRTEC ALLERGY 10 MG  TABS (CETIRIZINE HCL) 1 once daily prn  #30 x 6   Entered and Authorized by:   Helane Rima DO   Signed by:   Helane Rima DO on 05/21/2010   Method used:   Electronically to        [facility] [phone]* (retail)       [address]       [place], [place]  [address]       Ph: [phone]       Fax: [phone]   RxID:   [id number] ALBUTEROL 90 MCG/ACT AERS (ALBUTEROL) Inhale 2 puff using inhaler every four hours Brand medically necessary #1 x 6   Entered and Authorized by:   Helane Rima DO   Signed by:   Helane Rima DO on 05/21/2010   Method used:   Electronically to        [facility] [phone]* (retail)       [address]       [place], [place]  [address]       Ph: [phone]       Fax: [phone]   RxID:   [id number]

## 2010-08-17 NOTE — Progress Notes (Signed)
Summary: pls call  Phone Note Call from Patient Call back at Home Phone [phone]   Caller: Patient Summary of Call: needs to talk to [facility] about the Dental clinic Initial call taken by: De Nurse,  June 03, 2009 11:36 AM  Follow-up for Phone Call        she wants to be seen today. told her dental clinic I applied to considers emergencies as being on antibiotics and painkillers. suggested clinic on [place] [address] that pulls teeth for $80. states she has already called them. she is going to go thru the phone book & call until she finds one that will see her today & will let her make payments Follow-up by: Golden Circle RN,  June 03, 2009 11:39 AM

## 2010-08-17 NOTE — Assessment & Plan Note (Signed)
Summary: diarrhea and n/v/chills/sweats x 4 days/bmc   Vital Signs:  Patient profile:   35 year old female Height:      62 inches Weight:      145 pounds BMI:     26.62 BSA:     1.67 Temp:     98.3 degrees F Pulse rate:   76 / minute BP sitting:   144 / 95  Vitals Entered By: Jone Baseman CMA (May 20, 2010 4:05 PM) CC: n/v/d, chills, stomach pain, fatigue x 2 days Is Patient Diabetic? No Pain Assessment Patient in pain? yes     Location: under left shoulder blade Intensity: 6   Primary Care Provider:  Niel Hummer  CC:  n/v/d, chills, stomach pain, and fatigue x 2 days.  History of Present Illness: 35 yo F:  1. Viral Illness: N/V x 2 - NBNB, D - no blood, watery, no fever, + chills, URI s/s - cough, runny nose, congestion, abdominal cramps relieved with defecation, fatigue. No HA, dizziness, CP, SOB, LE edema, rash. Works at [employer]. Hx asthma - noticed more wheezing and tightness - using albuterol QID with some improvement - mints or halls drops work better to open airway. Has not taken anything to treat. No appetitie but drinking lots of fluids.  2. HTN: Rx Metoprolol.   Habits & Providers  Alcohol-Tobacco-Diet     Alcohol drinks/day: 0     Alcohol Counseling: not indicated; patient does not drink     Tobacco Status: current     Tobacco Counseling: to quit use of tobacco products     Cigarette Packs/Day: <0.25  Current Medications (verified): 1)  Albuterol 90 Mcg/act Aers (Albuterol) .... Inhale 2 Puff Using Inhaler Every Four Hours 2)  Ambien 5 Mg Tabs (Zolpidem Tartrate) .... Take One By Mouth Each Night For Sleep. 3)  Metoprolol Tartrate 50 Mg Tabs (Metoprolol Tartrate) .... Take 1 Tab By Mouth Two Times A Day 4)  Ibuprofen 800 Mg Tabs (Ibuprofen) .... One By Mouth Up To Three Times A Day As Needed For Headaches 5)  Meclizine Hcl 25 Mg Tabs (Meclizine Hcl) .Marland Kitchen.. 1-2 Tabs By Mouth Q 8 Hours As Needed For Vertigo 6)  Zyrtec Allergy 10 Mg  Tabs  (Cetirizine Hcl) .Marland Kitchen.. 1 Once Daily Prn 7)  Lisinopril 20 Mg Tabs (Lisinopril) .Marland Kitchen.. 1 Tablet By Mouth Daily  Allergies (verified): No Known Drug Allergies PMH-FH-SH reviewed for relevance  Review of Systems      See HPI  Physical Exam  General:  Well-developed, well-nourished, in no acute distress; alert, appropriate and cooperative throughout examination. Vitals reviewed. Eyes:  No injection. Ears:  R ear normal and L ear normal.   Nose:  Mucosal erythema and mucosal edema.   Mouth:  Oral mucosa and oropharynx without lesions or exudates.   Neck:  No deformities, masses, or tenderness noted. Lungs:  Normal respiratory effort, chest expands symmetrically. Lungs are clear to auscultation, no crackles or wheezes. Heart:  Normal rate and regular rhythm. S1 and S2 normal without gallop, murmur, click, rub or other extra sounds. Abdomen:  Bowel sounds positive,abdomen soft and non-tender without masses, organomegaly or hernias noted. Pulses:  R and L dorsalis pedis and posterior tibial pulses are full and equal bilaterally. Extremities:  No clubbing, cyanosis, edema, or deformity noted with normal full range of motion of all joints.     Impression & Recommendations:  Problem # 1:  VIRAL INFECTION (ICD-079.99) Assessment New  No Red Flags. Symptomatic treatment.  Note for work. Her updated medication list for this problem includes:    Ibuprofen 800 Mg Tabs (Ibuprofen) ..... One by mouth up to three times a day as needed for headaches  Orders: [facility]- Est  Level 4 (99214)  Problem # 2:  ASTHMA, INTERMITTENT (ICD-493.90) Assessment: Unchanged  Her updated medication list for this problem includes:    Albuterol 90 Mcg/act Aers (Albuterol) ..... Inhale 2 puff using inhaler every four hours  Orders: [facility]- Est  Level 4 ([address]109)  Problem # 3:  HYPERTENSION, BENIGN (ICD-401.1) Assessment: Unchanged  Adding Lisinopril. Check BMP in 1-2 weeks. Her updated medication list for this problem  includes:    Metoprolol Tartrate 50 Mg Tabs (Metoprolol tartrate) .Marland Kitchen... Take 1 tab by mouth two times a day    Lisinopril 20 Mg Tabs (Lisinopril) .Marland Kitchen... 1 tablet by mouth daily  Orders: Basic Met-[facility] (60454-09811) [facility]- Est  Level 4 (91478)  Problem # 4:  TOBACCO ABUSE (ICD-305.1) Assessment: Unchanged  Advised cessation.  Orders: [facility]- Est  Level 4 (99214)  Complete Medication List: 1)  Albuterol 90 Mcg/act Aers (Albuterol) .... Inhale 2 puff using inhaler every four hours 2)  Ambien 5 Mg Tabs (Zolpidem tartrate) .... Take one by mouth each night for sleep. 3)  Metoprolol Tartrate 50 Mg Tabs (Metoprolol tartrate) .... Take 1 tab by mouth two times a day 4)  Ibuprofen 800 Mg Tabs (Ibuprofen) .... One by mouth up to three times a day as needed for headaches 5)  Meclizine Hcl 25 Mg Tabs (Meclizine hcl) .Marland Kitchen.. 1-2 tabs by mouth q 8 hours as needed for vertigo 6)  Zyrtec Allergy 10 Mg Tabs (Cetirizine hcl) .Marland Kitchen.. 1 once daily prn 7)  Lisinopril 20 Mg Tabs (Lisinopril) .Marland Kitchen.. 1 tablet by mouth daily  Patient Instructions: 1)  It was nice to see you today! 2)  Drink lots of fluids. 3)  Restart the Zyrtec. 4)  Call or come back if you get worse. 5)  Take Imodium for diarrhea. 6)  I am adding Lisinopril - come back in 2 weeks for a lab draw. Prescriptions: LISINOPRIL 20 MG TABS (LISINOPRIL) 1 tablet by mouth daily  #90 x 3   Entered and Authorized by:   Helane Rima DO   Signed by:   Helane Rima DO on 05/20/2010   Method used:   Electronically to        [facility] [phone]* (retail)       [address]       [place], [place]  [address]       Ph: [phone]       Fax: [phone]   RxID:   [id number]    Orders Added: 1)  Basic Met-[facility] [59563-87564] 2)  [facility]- Est  Level 4 [33295]    Prevention & Chronic Care Immunizations   Influenza vaccine: Fluvax Non-MCR  (08/05/2009)   Influenza vaccine deferral: Refused  (05/20/2010)    Tetanus booster: 08/05/2009: Tdap    Tetanus booster due: 03/19/2007    Pneumococcal vaccine: Done.  (04/17/2001)   Pneumococcal vaccine due: None  Other Screening   Pap smear: Done.  (07/18/2005)   Pap smear due: 07/18/2006   Smoking status: current  (05/20/2010)   Smoking cessation counseling: yes  (08/14/2008)  Hypertension   Last Blood Pressure: 144 / 95  (05/20/2010)   Serum creatinine: 0.80  (08/05/2009)   Serum potassium 4.3  (08/05/2009)    Hypertension flowsheet reviewed?: Yes   Progress toward BP goal: Unchanged  Self-Management Support :   Personal Goals (by the next clinic visit) :      Personal blood pressure goal: 140/90  (03/17/2010)   Patient will work on the following items until the next clinic visit to reach self-care goals:     Medications and monitoring: take my medicines every day, check my blood pressure, bring all of my medications to every visit  (05/20/2010)     Eating: drink diet soda or water instead of juice or soda, eat more vegetables, use fresh or frozen vegetables, eat foods that are low in salt, eat baked foods instead of fried foods, eat fruit for snacks and desserts, limit or avoid alcohol  (05/20/2010)     Activity: take a 30 minute walk every day  (05/20/2010)    Hypertension self-management support: Written self-care plan  (05/20/2010)   Hypertension self-care plan printed.

## 2010-08-19 NOTE — Progress Notes (Signed)
Summary: UPDATE PROBLEM LIST   

## 2010-08-25 NOTE — Consult Note (Signed)
Summary: [facility]   Imported By: De Nurse 08/17/2010 14:45:27  _____________________________________________________________________  External Attachment:    Type:   Image     Comment:   External Document

## 2010-09-20 ENCOUNTER — Encounter: Payer: Self-pay | Admitting: Family Medicine

## 2010-10-06 ENCOUNTER — Ambulatory Visit (INDEPENDENT_AMBULATORY_CARE_PROVIDER_SITE_OTHER): Payer: Self-pay | Admitting: Family Medicine

## 2010-10-06 ENCOUNTER — Other Ambulatory Visit (HOSPITAL_COMMUNITY)
Admission: RE | Admit: 2010-10-06 | Discharge: 2010-10-06 | Disposition: A | Discharge status: Home / Self Care | Payer: Self-pay | Source: Ambulatory Visit | Attending: Family Medicine | Admitting: Family Medicine

## 2010-10-06 ENCOUNTER — Encounter: Payer: Self-pay | Admitting: Family Medicine

## 2010-10-06 DIAGNOSIS — Z124 Encounter for screening for malignant neoplasm of cervix: Secondary | ICD-10-CM

## 2010-10-06 DIAGNOSIS — Z01419 Encounter for gynecological examination (general) (routine) without abnormal findings: Secondary | ICD-10-CM | POA: Insufficient documentation

## 2010-10-06 DIAGNOSIS — N760 Acute vaginitis: Secondary | ICD-10-CM

## 2010-10-06 DIAGNOSIS — R42 Dizziness and giddiness: Secondary | ICD-10-CM

## 2010-10-06 DIAGNOSIS — F17200 Nicotine dependence, unspecified, uncomplicated: Secondary | ICD-10-CM

## 2010-10-06 DIAGNOSIS — Z13220 Encounter for screening for lipoid disorders: Secondary | ICD-10-CM

## 2010-10-06 DIAGNOSIS — Z20828 Contact with and (suspected) exposure to other viral communicable diseases: Secondary | ICD-10-CM

## 2010-10-06 DIAGNOSIS — D649 Anemia, unspecified: Secondary | ICD-10-CM

## 2010-10-06 DIAGNOSIS — I10 Essential (primary) hypertension: Secondary | ICD-10-CM

## 2010-10-06 LAB — CBC
HCT: 33.8 % — ABNORMAL LOW (ref 36.0–46.0)
MCH: 29.4 pg (ref 26.0–34.0)
Platelets: 286 10*3/uL (ref 150–400)
RBC: 3.81 MIL/uL — ABNORMAL LOW (ref 3.87–5.11)

## 2010-10-06 LAB — LIPID PANEL
Cholesterol: 168 mg/dL (ref 0–200)
HDL: 49 mg/dL (ref >39)
VLDL: 12 mg/dL (ref 0–40)

## 2010-10-06 LAB — COMPREHENSIVE METABOLIC PANEL
ALT: 12 U/L (ref 0–35)
AST: 14 U/L (ref 0–37)
BUN: 7 mg/dL (ref 6–23)
Calcium: 9.7 mg/dL (ref 8.4–10.5)
Chloride: 107 mEq/L (ref 96–112)
Total Bilirubin: 0.3 mg/dL (ref 0.3–1.2)

## 2010-10-06 LAB — POCT WET PREP (WET MOUNT): Trichomonas Wet Prep HPF POC: NEGATIVE

## 2010-10-06 MED ORDER — MECLIZINE HCL 25 MG PO TABS: 25.0000 mg | ORAL_TABLET | Freq: Three times a day (TID) | ORAL | Status: DC | PRN

## 2010-10-06 NOTE — Progress Notes (Signed)
  Subjective:     Jessica Hale is a 35 y.o. female and is here for a comprehensive physical exam. The patient reports problems - dizziness, similar to previous episode of vertigo. Happens almost daily. Positional. Feeling of room spinning. No falls or LOC.Marland Kitchen  History   Social History  . Marital Status: Single    Spouse Name: N/A    Number of Children: N/A  . Years of Education: N/A   Occupational History  . Not on file.   Social History Main Topics  . Smoking status: Current Everyday Smoker    Types: Cigars  . Smokeless tobacco: Not on file  . Alcohol Use: Not on file  . Drug Use: Not on file  . Sexually Active: Not on file   Other Topics Concern  . Not on file   Social History Narrative  . No narrative on file   Health Maintenance  Topic Date Due  . Pap Smear  05/03/1996  . Tetanus/tdap  08/06/2019    The following portions of the patient's history were reviewed and updated as appropriate: allergies, current medications, past family history, past medical history, past social history, past surgical history and problem list.  Review of Systems Patient reports no weight loss, fever problems walking, chest pain, frequent cough, hemoptysis, dyspnea, breast lumps, gastrointestinal bleeding, abdominal masses, excessive heart burn, visual problems, hearing problems, adenopathy, prolonged hoarseness, syncope, focal weakness, memory loss, incontinence, dysuria, hematuria, changing moles, skin ulcers, edema, depression, anxiety, vaginal sores or discharge.  Objective:    BP 132/88  Pulse 80  Temp(Src) 98.6 F (37 C) (Oral)  Wt 139 lb 8 oz (63.277 kg)  LMP 09/09/2010 General appearance: alert, cooperative and no distress Eyes: conjunctivae/corneas clear. PERRL, EOM's intact. Fundi benign. Ears: normal TM's and external ear canals both ears Nose: Nares normal. Septum midline. Mucosa normal. No drainage or sinus tenderness. Throat: lips, mucosa, and tongue normal; teeth and  gums normal Neck: no adenopathy, supple, symmetrical, trachea midline and thyroid not enlarged, symmetric, no tenderness/mass/nodules Lungs: clear to auscultation bilaterally Breasts: normal appearance, no masses or tenderness Heart: regular rate and rhythm, S1, S2 normal, no murmur, click, rub or gallop Abdomen: soft, non-tender; bowel sounds normal; no masses,  no organomegaly Pelvic: cervix normal in appearance, external genitalia normal, no adnexal masses or tenderness, no cervical motion tenderness, rectovaginal septum normal, uterus normal size, shape, and consistency and vagina normal without discharge Extremities: extremities normal, atraumatic, no cyanosis or edema Pulses: 2+ and symmetric Skin: Skin color, texture, turgor normal. No rashes or lesions Neurologic: Grossly normal    Assessment:     1. Healthy female exam.  2. Vertigo.  3. Tobacco/THC Abuse.  Plan:     See After Visit Summary for Counseling Recommendations

## 2010-10-06 NOTE — Assessment & Plan Note (Signed)
Continue current treatment. 

## 2010-10-06 NOTE — Patient Instructions (Signed)
It was nice to see you today!  We are checking labs.  STOP SMOKING.

## 2010-10-06 NOTE — Assessment & Plan Note (Signed)
Advised to quit. Also, informed patient that twice weekly THC use may be contributing to vertigo symptoms.

## 2010-10-07 ENCOUNTER — Encounter: Payer: Self-pay | Admitting: Family Medicine

## 2010-10-07 LAB — GC/CHLAMYDIA PROBE AMP, GENITAL: GC Probe Amp, Genital: NEGATIVE

## 2010-10-07 MED ORDER — FLUCONAZOLE 150 MG PO TABS: 150.0000 mg | ORAL_TABLET | Freq: Once | ORAL | Status: AC

## 2010-10-07 MED ORDER — METRONIDAZOLE 500 MG PO TABS: 500.0000 mg | ORAL_TABLET | Freq: Three times a day (TID) | ORAL | Status: AC

## 2010-10-07 MED ORDER — FERROUS SULFATE 325 (65 FE) MG PO TABS: 325.0000 mg | ORAL_TABLET | Freq: Every day | ORAL | Status: DC

## 2010-10-07 NOTE — Progress Notes (Signed)
Addended by: Helane Rima on: 10/07/2010 01:02 PM   Modules accepted: Orders

## 2010-10-21 LAB — RAPID STREP SCREEN (MED CTR MEBANE ONLY): Streptococcus, Group A Screen (Direct): NEGATIVE

## 2010-11-01 ENCOUNTER — Ambulatory Visit
Admission: RE | Admit: 2010-11-01 | Discharge: 2010-11-01 | Disposition: A | Discharge status: Home / Self Care | Payer: Self-pay | Source: Ambulatory Visit | Attending: Sports Medicine | Admitting: Sports Medicine

## 2010-11-01 ENCOUNTER — Encounter: Payer: Self-pay | Admitting: Sports Medicine

## 2010-11-01 ENCOUNTER — Ambulatory Visit (INDEPENDENT_AMBULATORY_CARE_PROVIDER_SITE_OTHER): Payer: Self-pay | Admitting: Sports Medicine

## 2010-11-01 VITALS — BP 142/88 | Temp 98.1°F | Wt 136.0 lb

## 2010-11-01 DIAGNOSIS — R1032 Left lower quadrant pain: Secondary | ICD-10-CM

## 2010-11-01 DIAGNOSIS — R5381 Other malaise: Secondary | ICD-10-CM

## 2010-11-01 DIAGNOSIS — R5383 Other fatigue: Secondary | ICD-10-CM | POA: Insufficient documentation

## 2010-11-01 MED ORDER — ACETAMINOPHEN ER 650 MG PO TBCR: 1300.0000 mg | EXTENDED_RELEASE_TABLET | Freq: Three times a day (TID) | ORAL | Status: AC | PRN

## 2010-11-01 NOTE — Patient Instructions (Signed)
Great to see you, Checking bloodwork. XR of your pelvis. Acetaminophen as below. Come back to see me in 1 week to go over results.  Ihor Austin. Benjamin Stain, M.D.

## 2010-11-01 NOTE — Assessment & Plan Note (Signed)
With constipation, cold intolerance, lateral eyebrow thinning, hair loss. Suspect thyroid dysfunction however recent TSH neg. Checking ANA, f/u with pcp re: this. Anemia can also cause this, which she does have and is currently on iron.

## 2010-11-01 NOTE — Progress Notes (Signed)
  Subjective:    Patient ID: Jessica Hale, female    DOB: 1976/06/23, 35 y.o.   MRN: [id number]  HPI 35 yo female with L groin pain for several days now.  No injuries, no change in exercise intensity, pain is just at groin, radiates to upper anterior thigh, worse with most movements but particularly hip flexion, worse with sitting.  Tylenol helps a little, No pain with coughing or straining, no hx of lifting something heavy recently,   Fatigue, hair loss, constipation, cold intolerance, loss of hair on lateral eyebrows:  TSH, CMET normal recently, Hb in the 11s, currently on iron.  Review of Systems    See HPI Objective:   Physical Exam  Constitutional: She appears well-developed and well-nourished.  HENT:       Hair loss on lateral 1/2 of eye brows  Abdominal: Soft. She exhibits no distension and no mass. There is no tenderness. There is no rebound and no guarding.       No palpable hernias with cough/valsalva in abdomen, inguinal canal, or femoral triangle.  Musculoskeletal:       L Hip: ROM IR: 45 Deg, ER: 45 Deg, Flexion: 120 Deg, Extension: 100 Deg, Abduction: 45 Deg, Adduction: 45 Deg Strength IR: 5/5, ER: 5/5, Flexion: 5/5, Extension: 5/5, Abduction: 5/5, Adduction: 5/5 Pelvic alignment unremarkable to inspection and palpation. Greater trochanter without tenderness to palpation. No tenderness over piriformis and greater trochanter. Very painful with FADIR No SI joint tenderness and normal minimal SI movement. Also painful with resisted hip flexion and internal SLR.   Skin: Skin is warm and dry.          Assessment & Plan:

## 2010-11-01 NOTE — Assessment & Plan Note (Addendum)
DDx includes femoroacetabular impingement, hernia, adductor strain. Checking Pelvis AP incl frog leg views to assess ?cam/pincer lesion. Tylenol 650 two tabs TID prn. RTC 1 week to discuss results. If XR neg, will consider CT to eval hernia.

## 2010-11-02 ENCOUNTER — Telehealth: Payer: Self-pay | Admitting: Family Medicine

## 2010-11-02 LAB — ANA: Anti Nuclear Antibody(ANA): NEGATIVE

## 2010-11-02 NOTE — Telephone Encounter (Signed)
Pt called again

## 2010-11-02 NOTE — Telephone Encounter (Signed)
XR and labs negative, how is the acetaminophen 650 working? I am seeing her in a week to re-evaluate.

## 2010-11-02 NOTE — Telephone Encounter (Signed)
Forward to Wallace 

## 2010-11-02 NOTE — Telephone Encounter (Signed)
Forwarding to Dr T

## 2010-11-02 NOTE — Telephone Encounter (Signed)
Had labs and xray done and wants to know results

## 2011-03-17 ENCOUNTER — Ambulatory Visit (INDEPENDENT_AMBULATORY_CARE_PROVIDER_SITE_OTHER): Payer: Self-pay | Admitting: Family Medicine

## 2011-03-17 ENCOUNTER — Encounter: Payer: Self-pay | Admitting: Family Medicine

## 2011-03-17 DIAGNOSIS — I10 Essential (primary) hypertension: Secondary | ICD-10-CM

## 2011-03-17 DIAGNOSIS — N898 Other specified noninflammatory disorders of vagina: Secondary | ICD-10-CM | POA: Insufficient documentation

## 2011-03-17 DIAGNOSIS — R42 Dizziness and giddiness: Secondary | ICD-10-CM

## 2011-03-17 LAB — POCT WET PREP (WET MOUNT): Trichomonas Wet Prep HPF POC: NEGATIVE

## 2011-03-17 MED ORDER — FLUCONAZOLE 150 MG PO TABS: 150.0000 mg | ORAL_TABLET | Freq: Once | ORAL | Status: AC

## 2011-03-17 MED ORDER — LORATADINE-PSEUDOEPHEDRINE ER 5-120 MG PO TB12: 1.0000 | ORAL_TABLET | Freq: Two times a day (BID) | ORAL | Status: DC

## 2011-03-17 MED ORDER — METRONIDAZOLE 500 MG PO TABS: 500.0000 mg | ORAL_TABLET | Freq: Two times a day (BID) | ORAL | Status: AC

## 2011-03-17 NOTE — Patient Instructions (Signed)
Thank you for coming in today. You have bacterial vaginosis this is a change in the normal bacteria in your vagina.  I have prescribed a medicine called metronidazole which you takes twice a day for one week.  Don't drink with taking this medicine as it will make a sick. You also has a yeast infection. I have prescribed a medicine called fluconazole. Take one pill x1 and repeat in one week if you still having symptoms. I have also  referred and ENT doctor. My clinic staff will contact he was more information about this.

## 2011-03-17 NOTE — Assessment & Plan Note (Signed)
Essentially a chronic problem for this patient.  I suspect that she may have Mnire's disease based on her vertigo and tinnitus.  However her ear exam does show retraction bilaterally which could indicate chronic eustachian tube irritation.   Plan:  As this problem is chronic and hasn't resolved with her typical routine treatment I will refer to ENT.  If she does not have insurance I will use [facility] practice.  This will take some time.  In the meantime I will attempt to treat with Claritin-D.  Have patient followup with PCP as needed.

## 2011-03-17 NOTE — Assessment & Plan Note (Signed)
Both a yeast infection and bacterial vaginosis.  plan to treat with both metronidazole and fluconazole  Red flags given patient will followup

## 2011-03-17 NOTE — Progress Notes (Signed)
Jessica Hale is here for vaginal discharge and itching: Starting 3 days ago with discharge described as white and thick, and itching started today. No dysuria or abdominal complaints. She was last tested for sexually transmitted infections a few months ago and was negative.  She does not take any birth control medications but only has sex with women.    Additionally notes left ear fullness, itching associated with vertigo and tinnitus.  This has been going off and on for 2 weeks but chronically this problem has been going on for years.  She has right multiple over-the-counter eardrops Benadryl Mucinex and meclizine for this issue in the past none of which have helped very much.  She has not been to see a ear nose and throat for doctor for this problem.  She denies any ear pain currently.  No discharge.  PMH reviewed.  ROS as above otherwise neg  Exam:  BP 147/96  Pulse 60  Temp(Src) 98.5 F (36.9 C) (Oral)  Ht 5\' 2"  (1.575 m)  Wt 144 lb (65.318 kg)  BMI 26.34 kg/m2 Gen: Well NAD HEENT: EOMI,  MMM,  bilateral ears are retracted with hazy tympanic membranes but no erythema and no visible fluid behind the membranes.  Canals are normal and free of wax. Abd: NABS, NT, ND GYN: Deferred, patient did a self wet prep.  Exts: Non edematous BL  LE, warm and well perfused.

## 2011-03-17 NOTE — Assessment & Plan Note (Signed)
Blood pressure elevated today I recommended the patient followup with her primary care provider in one month

## 2011-03-23 ENCOUNTER — Telehealth: Payer: Self-pay | Admitting: *Deleted

## 2011-03-23 ENCOUNTER — Emergency Department (HOSPITAL_COMMUNITY)
Admission: EM | Admit: 2011-03-23 | Discharge: 2011-03-24 | Disposition: A | Discharge status: Home / Self Care | Payer: Self-pay | Attending: Emergency Medicine | Admitting: Emergency Medicine

## 2011-03-23 DIAGNOSIS — I10 Essential (primary) hypertension: Secondary | ICD-10-CM | POA: Insufficient documentation

## 2011-03-23 DIAGNOSIS — H8309 Labyrinthitis, unspecified ear: Secondary | ICD-10-CM | POA: Insufficient documentation

## 2011-03-23 NOTE — Telephone Encounter (Signed)
Waiting for Pat to call me back with appt for pt. Jessica Hale, Jessica Hale

## 2011-03-24 ENCOUNTER — Telehealth: Payer: Self-pay | Admitting: *Deleted

## 2011-03-24 NOTE — Telephone Encounter (Signed)
Waiting for pt to call back. Please tell pt: appt at [facility] 04-18-11 at 1:30pm. They will send out info packet to pt with appt information/location. Ph# [phone] .Arlyss Repress

## 2011-03-24 NOTE — Telephone Encounter (Signed)
Pt returned call and info was given.

## 2011-04-12 LAB — RAPID URINE DRUG SCREEN, HOSP PERFORMED
Amphetamines: NOT DETECTED
Benzodiazepines: POSITIVE — AB

## 2011-04-12 LAB — DIFFERENTIAL
Lymphocytes Relative: 38
Lymphs Abs: 3.3
Neutro Abs: 4.4
Neutrophils Relative %: 51

## 2011-04-12 LAB — CBC
Platelets: 324
WBC: 8.6

## 2011-04-12 LAB — ACETAMINOPHEN LEVEL: Acetaminophen (Tylenol), Serum: <10 — ABNORMAL LOW

## 2011-04-12 LAB — BASIC METABOLIC PANEL
BUN: 8
Creatinine, Ser: 0.9
GFR calc non Af Amer: >60
Potassium: 3.7

## 2011-04-12 LAB — ETHANOL: Alcohol, Ethyl (B): <5

## 2011-04-12 LAB — SALICYLATE LEVEL: Salicylate Lvl: <4

## 2011-04-26 LAB — DIFFERENTIAL
Basophils Relative: 0
Lymphs Abs: 2.1
Monocytes Absolute: 0.4
Monocytes Relative: 6
Neutro Abs: 3.4

## 2011-04-26 LAB — CBC
Hemoglobin: 13.3
MCHC: 34.5
RBC: 4.25

## 2011-04-26 LAB — POCT CARDIAC MARKERS
CKMB, poc: <1 — ABNORMAL LOW
CKMB, poc: <1 — ABNORMAL LOW
Myoglobin, poc: 47.5
Troponin i, poc: <0.05

## 2011-04-26 LAB — I-STAT 8, (EC8 V) (CONVERTED LAB)
Acid-Base Excess: 1
Chloride: 105
HCT: 44
Operator id: 198171
Potassium: 3 — ABNORMAL LOW

## 2011-04-26 LAB — POCT I-STAT CREATININE: Operator id: 198171

## 2011-04-28 ENCOUNTER — Ambulatory Visit (INDEPENDENT_AMBULATORY_CARE_PROVIDER_SITE_OTHER): Payer: Self-pay | Admitting: Family Medicine

## 2011-04-28 ENCOUNTER — Encounter: Payer: Self-pay | Admitting: Family Medicine

## 2011-04-28 VITALS — BP 153/97 | HR 76 | Temp 98.4°F | Ht 62.0 in | Wt 149.0 lb

## 2011-04-28 DIAGNOSIS — G43009 Migraine without aura, not intractable, without status migrainosus: Secondary | ICD-10-CM

## 2011-04-28 DIAGNOSIS — G43809 Other migraine, not intractable, without status migrainosus: Secondary | ICD-10-CM

## 2011-04-28 DIAGNOSIS — G4700 Insomnia, unspecified: Secondary | ICD-10-CM

## 2011-04-28 DIAGNOSIS — Z23 Encounter for immunization: Secondary | ICD-10-CM

## 2011-04-28 DIAGNOSIS — I10 Essential (primary) hypertension: Secondary | ICD-10-CM

## 2011-04-28 MED ORDER — METOPROLOL TARTRATE 100 MG PO TABS: 100.0000 mg | ORAL_TABLET | Freq: Two times a day (BID) | ORAL | Status: DC

## 2011-04-28 NOTE — Progress Notes (Signed)
  Subjective:    Patient ID: Jessica Hale, female    DOB: Oct 18, 1975, 35 y.o.   MRN: [id number]  HPI 1.  Dizziness:  Here to f/u for dizziness after being seen by ENT at [facility].  Told by ENT that this was atypical migraines and started on valium. Episodes have improved.  Has been referred to neurology by ENT and first appt is in December.  Asking about options to help control migraines until she can get in with neurology.  Denies nausea, headache. 2. Insomnia:  Long standing difficulty with insomnia.  Has been on ambien and this has been working well for her.  No signs of abuse.  No feelings of daytime drowsiness. 3. HTN:  BP elevated today.  Currently on lisinopril and metoprolol, taking daily.  Denies chest pain, shortness of breath, vweakness, palpitations.   Review of Systems     Objective:   Physical Exam  Constitutional: She appears well-developed and well-nourished. No distress.  HENT:  Head: Normocephalic and atraumatic.  Eyes: EOM are normal. Pupils are equal, round, and reactive to light.  Neck: Normal range of motion. Neck supple. No thyromegaly present.  Cardiovascular: Normal rate, regular rhythm and normal heart sounds.   Pulmonary/Chest: Effort normal and breath sounds normal.  Musculoskeletal: She exhibits no edema.  Neurological: She is alert. She has normal strength. No cranial nerve deficit or sensory deficit.  Skin: Skin is warm and dry.          Assessment & Plan:

## 2011-04-28 NOTE — Patient Instructions (Signed)
It was nice meeting you today. I would like for you to increase your metoprolol to 100 mg twice a day. I think this will help with your blood pressures he prevention of migraines. If you feel like this is not helping with the migraines at all please give me a call back and we will talk about other options. If you have questions feel free to give me a call here at the office [phone]

## 2011-04-29 ENCOUNTER — Telehealth: Payer: Self-pay | Admitting: Family Medicine

## 2011-04-29 NOTE — Telephone Encounter (Signed)
Fwd. To PCP .Marieta Markov  

## 2011-04-29 NOTE — Telephone Encounter (Signed)
Need refill for [employer]

## 2011-05-02 DIAGNOSIS — G43009 Migraine without aura, not intractable, without status migrainosus: Secondary | ICD-10-CM | POA: Insufficient documentation

## 2011-05-02 NOTE — Assessment & Plan Note (Signed)
Diagnosed by ENT recently and started on valium, seems to helping some.  Discussed options for migraine ppx including increasing dose of BB or starting additional medication.  She elects to increase BB for now until she meets with neurology.

## 2011-05-02 NOTE — Assessment & Plan Note (Signed)
Will increase BB for migraine ppx and to help with BP control.  No red flags today.

## 2011-05-02 NOTE — Assessment & Plan Note (Signed)
Refill ambien

## 2011-05-03 LAB — POCT RAPID STREP A: Streptococcus, Group A Screen (Direct): NEGATIVE

## 2011-05-03 MED ORDER — ZOLPIDEM TARTRATE 5 MG PO TABS: 5.0000 mg | ORAL_TABLET | Freq: Every evening | ORAL | Status: DC | PRN

## 2011-05-03 NOTE — Telephone Encounter (Signed)
Called into [facility] on [address]

## 2011-07-01 ENCOUNTER — Other Ambulatory Visit: Payer: Self-pay | Admitting: Family Medicine

## 2011-07-01 NOTE — Telephone Encounter (Signed)
Refill request

## 2011-07-27 ENCOUNTER — Ambulatory Visit (INDEPENDENT_AMBULATORY_CARE_PROVIDER_SITE_OTHER): Payer: Self-pay | Admitting: Family Medicine

## 2011-07-27 VITALS — BP 156/100 | Temp 98.3°F | Ht 62.0 in | Wt 158.0 lb

## 2011-07-27 DIAGNOSIS — J029 Acute pharyngitis, unspecified: Secondary | ICD-10-CM

## 2011-07-27 DIAGNOSIS — H6990 Unspecified Eustachian tube disorder, unspecified ear: Secondary | ICD-10-CM

## 2011-07-27 MED ORDER — FLUTICASONE PROPIONATE 50 MCG/ACT NA SUSP: 2.0000 | Freq: Every day | NASAL | Status: DC

## 2011-07-27 MED ORDER — LORATADINE-PSEUDOEPHEDRINE ER 5-120 MG PO TB12: 1.0000 | ORAL_TABLET | Freq: Two times a day (BID) | ORAL | Status: AC

## 2011-07-27 MED ORDER — PROMETHAZINE HCL 12.5 MG PO TABS: 12.5000 mg | ORAL_TABLET | Freq: Three times a day (TID) | ORAL | Status: AC | PRN

## 2011-07-27 NOTE — Progress Notes (Signed)
Jessica Hale is a 36 year old female who presents to clinic with headache vertigo sore throat.  This is been present for 2-3 days. Although the symptoms are typical for her. She is currently seeing a headache specialist for atypical migraines. Additionally she has been referred to ENT for vertigo evaluation. She feels well otherwise denies any tinnitus.  PMH reviewed.  ROS as above otherwise neg Medications reviewed. Current Outpatient Prescriptions  Medication Sig Dispense Refill  . albuterol (PROVENTIL,VENTOLIN) 90 MCG/ACT inhaler Inhale 2 puffs into the lungs every 4 (four) hours.       Marland Kitchen lisinopril (PRINIVIL,ZESTRIL) 20 MG tablet TAKE ONE TABLET BY MOUTH EVERY DAY  90 tablet  1  . metoprolol (LOPRESSOR) 100 MG tablet Take 1 tablet (100 mg total) by mouth 2 (two) times daily.  60 tablet  6  . nortriptyline (PAMELOR) 10 MG capsule Take 10 mg by mouth at bedtime.      Marland Kitchen zolpidem (AMBIEN) 5 MG tablet Take 1 tablet (5 mg total) by mouth at bedtime as needed.  30 tablet  3  . fluticasone (FLONASE) 50 MCG/ACT nasal spray Place 2 sprays into the nose daily.  16 g  6  . loratadine-pseudoephedrine (CLARITIN-D 12 HOUR) 5-120 MG per tablet Take 1 tablet by mouth 2 (two) times daily.  60 tablet  1  . promethazine (PHENERGAN) 12.5 MG tablet Take 1 tablet (12.5 mg total) by mouth every 8 (eight) hours as needed for nausea (or vertigo).  30 tablet  1    Exam:  BP 156/100  Temp(Src) 98.3 F (36.8 C) (Oral)  Ht 5\' 2"  (1.575 m)  Wt 158 lb (71.668 kg)  BMI 28.90 kg/m2 Gen: Well NAD HEENT: EOMI,  MMM, bilateral middle ear effusions without erythema Lungs: CTABL Nl WOB Heart: RRR no MRG Abd: NABS, NT, ND Exts: Non edematous BL  LE, warm and well perfused.  Neuro: Alert and oriented x3. Gait normal strength normal coordination is intact able to get onto and off of the exam table by herself.

## 2011-07-27 NOTE — Patient Instructions (Signed)
Thank you for coming in today. This could be due to your middle ear or even migraines.  We will try the middle ear approach with claritin D, Flonase. I will also provide some phenergan for nausea and vertigo.  Follow up with your headache doctor.  Consider restarting nortriptyline but take colace with it to prevent constipation.

## 2011-07-27 NOTE — Assessment & Plan Note (Signed)
Middle-ear effusions in the setting of sore throat likely due to auditory tube disorder.  He prescribe Claritin-D Flonase and Phenergan as needed. We'll followup as needed with primary care doctor. I recommended she continue seeing her specialist for atypical migraines as this additionally could be due to that issue.  She expresses understanding

## 2011-07-31 ENCOUNTER — Encounter (HOSPITAL_COMMUNITY): Payer: Self-pay

## 2011-07-31 ENCOUNTER — Emergency Department (INDEPENDENT_AMBULATORY_CARE_PROVIDER_SITE_OTHER)
Admission: EM | Admit: 2011-07-31 | Discharge: 2011-07-31 | Disposition: A | Discharge status: Home / Self Care | Payer: Self-pay | Source: Home / Self Care | Attending: Emergency Medicine | Admitting: Emergency Medicine

## 2011-07-31 DIAGNOSIS — R6889 Other general symptoms and signs: Secondary | ICD-10-CM

## 2011-07-31 DIAGNOSIS — J111 Influenza due to unidentified influenza virus with other respiratory manifestations: Secondary | ICD-10-CM

## 2011-07-31 MED ORDER — GUAIFENESIN-CODEINE 100-10 MG/5ML PO SYRP: 10.0000 mL | ORAL_SOLUTION | Freq: Four times a day (QID) | ORAL | Status: AC | PRN

## 2011-07-31 MED ORDER — TRAMADOL HCL 50 MG PO TABS: 100.0000 mg | ORAL_TABLET | Freq: Three times a day (TID) | ORAL | Status: AC | PRN

## 2011-07-31 NOTE — ED Notes (Signed)
Pt states symptoms started 2 days ago, cough,sore throat, congestion, headache and body aches, denies fever

## 2011-07-31 NOTE — ED Provider Notes (Signed)
Chief Complaint  Patient presents with  . Influenza    bodyaches,cough, sore throat, congestion, ha    History of Present Illness:  Jessica Hale has had a two-day history of aching all over, chills, and sweats but no fever. She also has had a sore throat, nasal congestion, sneezing, watery eyes, dizziness, headache, pain in her neck and back, aching in her chest, dry cough, wheezing, and nausea. She has a history of asthma and does have an albuterol inhaler at home. She's not wheezing or have any difficulty breathing right now.  Review of Systems:  Other than noted above, the patient denies any of the following symptoms. Systemic:  No fever, chills, sweats, fatigue, myalgias, headache, or anorexia. Eye:  No redness, pain or drainage. ENT:  No earache, nasal congestion, rhinorrhea, sinus pressure, or sore throat. Lungs:  No cough, sputum production, wheezing, shortness of breath. Or chest pain. GI:  No nausea, vomiting, abdominal pain or diarrhea. Skin:  No rash or itching.  PMFSH:  Past medical history, family history, social history, meds, and allergies were reviewed.  Physical Exam:   Vital signs:  BP 137/85  Pulse 77  Temp(Src) 98.8 F (37.1 C) (Oral)  Resp 20  SpO2 100%  LMP 07/29/2011 General:  Alert, in no distress. Eye:  No conjunctival injection or drainage. ENT:  TMs and canals were normal, without erythema or inflammation.  Nasal mucosa was clear and uncongested, without drainage.  Mucous membranes were moist.  Pharynx was clear, without exudate or drainage.  There were no oral ulcerations or lesions. Neck:  Supple, no adenopathy, tenderness or mass. Lungs:  No respiratory distress.  Lungs were clear to auscultation, without wheezes, rales or rhonchi.  Breath sounds were clear and equal bilaterally. Heart:  Regular rhythm, without gallops, murmers or rubs. Skin:  Clear, warm, and dry, without rash or lesions.  Labs:   Results for orders placed in visit on 07/27/11  POCT RAPID  STREP A (OFFICE)      Component Value Range   Rapid Strep A Screen Negative  Negative      Radiology:  No results found.  Medications given in [facility]:  None  Assessment:   Diagnoses that have been ruled out:  None  Diagnoses that are still under consideration:  None  Final diagnoses:  Influenza-like illness      Plan:   1.  The following meds were prescribed:   New Prescriptions   GUAIFENESIN-CODEINE (GUIATUSS AC) 100-10 MG/5ML SYRUP    Take 10 mLs by mouth 4 (four) times daily as needed for cough.   TRAMADOL (ULTRAM) 50 MG TABLET    Take 2 tablets (100 mg total) by mouth every 8 (eight) hours as needed for pain.   2.  The patient was instructed in symptomatic care and handouts were given. 3.  The patient was told to return if becoming worse in any way, if no better in 3 or 4 days, and given some red flag symptoms that would indicate earlier return.   Roque Lias, MD 07/31/11 [id number]

## 2011-07-31 NOTE — Discharge Instructions (Signed)
Most upper respiratory infections are caused by viruses and do not require antibiotics.  We try to save the antibiotics for when we really need them to avoid resistance.  This does not mean that there is nothing that can be done.  Here are a few hints about things that can be done at home to get over an upper respiratory infection quicker:  Get extra sleep and extra fluids.  Get 7 to 8 hours of sleep per night and 6 to 8 glasses of water a day.  Getting extra sleep keeps the immune system from getting run down.  Most people with an upper respiratory infection are a little dehydrated.  The extra fluids also keep the secretions liquified and easier to deal with.  Also, get extra vitamin C.  4000 mg per day is the recommended dose. For the aches, headache, and fever, acetaminophen or ibuprofen are helpful.  These can be alternated every 4 hours.  People with liver disease should avoid large amounts of acetaminophen, and people with ulcer disease, gastroesophageal reflux, gastritis, congestive heart failure, chronic kidney disease, coronary artery disease and the elderly should avoid ibuprofen. For nasal congestion try Mucinex-D, or if you're having lots of sneezing or copious clear nasal drainage Allegra-D-24 hour.  Saline nasal spray can also help as can decongestant sprays such as Afrin, but you should not use the decongestant sprays for more than 3 days since they can be habituating.  Sleeping with your head elevated can also be helpful.  For sinus pain, moist, hot compresses provide some relief.  Many people find that inhaling steam as in a shower or from a pot of steaming water can help. For sore throat, zinc containing lozenges such as Cold-Eze or Zicam are helpful.  Zinc helps to fight infection and has a mild astringent effect that relieves the sore, achey throat.  Hot salt water gargles (8 oz of hot water, 1/2 tsp of table salt, and a pinch of baking soda) can give relief as well as hot beverages such as  hot tea. For the cough, old time remedies such as honey or honey and lemon are tried and true.  Over the counter cough syrups such as Delsym 2 tsp every 12 hours can help as well.  It's important when you have an upper respiratory infection not to pass the infection to others.  This involves being very careful about the following:  Frequent hand washing or use of hand sanitizer, especially after coughing, sneezing, blowing your nose or touching your face, nose or eyes. Do not shake hands or touch anyone and try to avoid touching surfaces that other people use such as doorknobs, shopping carts, telephones and computer keyboards. Use tissues and dispose of them properly in a garbage can or ziplock bag. Cough into your sleeve. Do not let others eat or drink after you.  It's also important to recognize the signs of serious illness and get evaluated if they occur: Any respiratory infection that lasts more than 1 week.  Yellow nasal drainage and sputum are not reliable indicators of a bacterial infection, but if they last for more than 1 week, see your doctor. Fever and sore throat can indicate strep. Fever and cough can indicate influenza or pneumonia. Any kind of severe symptom such as difficulty breathing, intractable vomiting, or severe pain should prompt you to see a doctor as soon as possible.   Your body's immune system is really the thing that will get rid of this infection.  Anything  we do, will just strengthen your immune system or help it clear up the infection quicker.  Here are a few other helpful hints to improve your immune system to help overcome this illness or to prevent future infections:  A few vitamins can improve the health of your immune system.  That's why your diet should include plenty of fruits, vegetables, fish, nuts, and whole grains.  Vitamin A and bet-carotene can increase the cells that fight infections (T cells and B cells).  Vitamin A is abundant in dark greens and  orange vegetables such as spinach, greens, sweet potatoes, and carrots.  Vitamin B6 contributes to the maturation of white blood cells, the cells that fight disease.  Foods with vitamin B6 include cold cereal and bananas.  Vitamin C is credited with preventing colds because it increases white blood cells and also prevents cellular damage.  Citrus fruits, peaches and green and red bell peppers are all hight in vitamin C.  Vitamin E is an anti-oxidant that encourages the production of natural killer cells which reject foreign invaders and B cells that produce antibodies.  Foods high in vitamin E include wheat germ, nuts and seeds.  Foods high in omega-3 fatty acids found in foods like salmon, tuna and mackerel boost your immune system and help cells to engulf and absorb germs.  Probiotics are good bacteria that increase your T cells.  These can be found in yogurt and are available in supplements such as Culturelle or Align.  Moderate exercise increases the strength of your immune system and your ability to recover from illness.  I suggest 3 to 5 moderate intensity 30 minute workouts per week.    Sleep is another component of maintaining a strong immune system.  It enables your body to recuperate from the day's activities, stress and work.  My recommendation is to get between 7 and 8 hours of sleep per night.  If you smoke, try to quit completely or at least cut down.  Drink alcohol only in moderation if at all.  No more than 2 drinks daily for men or 1 for women.  Get a flu vaccine early in the fall or if you have not gotten one yet, once this illness has run its course.  If you are over 65, a smoker, or an asthmatic, get a pneumococcal vaccine.  My final recommendation is to maintain a healthy weight.  Excess weight can impair the immune system by interfering with the way the immune system deals with invading viruses or bacteria.   Influenza Facts Flu (influenza) is a contagious respiratory  illness caused by the influenza viruses. It can cause mild to severe illness. While most healthy people recover from the flu without specific treatment and without complications, older people, young children, and people with certain health conditions are at higher risk for serious complications from the flu, including death. CAUSES   The flu virus is spread from person to person by respiratory droplets from coughing and sneezing.   A person can also become infected by touching an object or surface with a virus on it and then touching their mouth, eye or nose.   Adults may be able to infect others from 1 day before symptoms occur and up to 7 days after getting sick. So it is possible to give someone the flu even before you know you are sick and continue to infect others while you are sick.  SYMPTOMS   Fever (usually high).   Headache.   Tiredness (  can be extreme).   Cough.   Sore throat.   Runny or stuffy nose.   Body aches.   Diarrhea and vomiting may also occur, particularly in children.   These symptoms are referred to as "flu-like symptoms". A lot of different illnesses, including the common cold, can have similar symptoms.  DIAGNOSIS   There are tests that can determine if you have the flu as long you are tested within the first 2 or 3 days of illness.   A doctor's exam and additional tests may be needed to identify if you have a disease that is a complicating the flu.  RISKS AND COMPLICATIONS  Some of the complications caused by the flu include:  Bacterial pneumonia or progressive pneumonia caused by the flu virus.   Loss of body fluids (dehydration).   Worsening of chronic medical conditions, such as heart failure, asthma, or diabetes.   Sinus problems and ear infections.  HOME CARE INSTRUCTIONS   Seek medical care early on.   If you are at high risk from complications of the flu, consult your health-care provider as soon as you develop flu-like symptoms. Those at  high risk for complications include:   People 65 years or older.   People with chronic medical conditions, including diabetes.   Pregnant women.   Young children.   Your caregiver may recommend use of an antiviral medication to help treat the flu.   If you get the flu, get plenty of rest, drink a lot of liquids, and avoid using alcohol and tobacco.   You can take over-the-counter medications to relieve the symptoms of the flu if your caregiver approves. (Never give aspirin to children or teenagers who have flu-like symptoms, particularly fever).  PREVENTION  The single best way to prevent the flu is to get a flu vaccine each fall. Other measures that can help protect against the flu are:  Antiviral Medications   A number of antiviral drugs are approved for use in preventing the flu. These are prescription medications, and a doctor should be consulted before they are used.   Habits for Good Health   Cover your nose and mouth with a tissue when you cough or sneeze, throw the tissue away after you use it.   Wash your hands often with soap and water, especially after you cough or sneeze. If you are not near water, use an alcohol-based hand cleaner.   Avoid people who are sick.   If you get the flu, stay home from work or school. Avoid contact with other people so that you do not make them sick, too.   Try not to touch your eyes, nose, or mouth as germs ore often spread this way.  IN CHILDREN, EMERGENCY WARNING SIGNS THAT NEED URGENT MEDICAL ATTENTION:  Fast breathing or trouble breathing.   Bluish skin color.   Not drinking enough fluids.   Not waking up or not interacting.   Being so irritable that the child does not want to be held.   Flu-like symptoms improve but then return with fever and worse cough.   Fever with a rash.  IN ADULTS, EMERGENCY WARNING SIGNS THAT NEED URGENT MEDICAL ATTENTION:  Difficulty breathing or shortness of breath.   Pain or pressure in the  chest or abdomen.   Sudden dizziness.   Confusion.   Severe or persistent vomiting.  SEEK IMMEDIATE MEDICAL CARE IF:  You or someone you know is experiencing any of the symptoms above. When you arrive at the  emergency center,report that you think you have the flu. You may be asked to wear a mask and/or sit in a secluded area to protect others from getting sick. MAKE SURE YOU:   Understand these instructions.   Monitor your condition.   Seek medical care if you are getting worse, or not improving.  Document Released: 07/07/2003 Document Revised: 03/16/2011 Document Reviewed: 04/02/2009 [facility] Patient Information 2012 [place], [place].

## 2011-08-04 ENCOUNTER — Telehealth: Payer: Self-pay | Admitting: Family Medicine

## 2011-08-04 NOTE — Telephone Encounter (Signed)
Jessica Hale is calling back, she says that she only has certain that she can be called.  I will page Dr. Ashley Royalty and ask him to give him a call.

## 2011-08-04 NOTE — Telephone Encounter (Signed)
Jessica Hale is at her wits end regarding the dizziness and headaches.  Have seen ent and neurologist regarding this without any resolution to problem.  Concerned greatly because of her employment.  Worried that she will be terminated because of her inability to function when she has episodes of these symptoms.  Please call patient with advice

## 2011-08-08 NOTE — Telephone Encounter (Signed)
Called number listed in chart.  No answer, left VM to call back.  Not a whole lot more we can do from our standpoint at this time. if Neuro/ENT unable to help.  Advise her to make OV so we can talk more about this problem

## 2011-08-24 ENCOUNTER — Ambulatory Visit: Payer: Self-pay | Admitting: Family Medicine

## 2011-08-31 ENCOUNTER — Ambulatory Visit (INDEPENDENT_AMBULATORY_CARE_PROVIDER_SITE_OTHER): Payer: Self-pay | Admitting: Family Medicine

## 2011-08-31 ENCOUNTER — Encounter: Payer: Self-pay | Admitting: Family Medicine

## 2011-08-31 VITALS — BP 138/91 | HR 69 | Ht 62.0 in | Wt 152.0 lb

## 2011-08-31 DIAGNOSIS — H6990 Unspecified Eustachian tube disorder, unspecified ear: Secondary | ICD-10-CM

## 2011-08-31 DIAGNOSIS — R42 Dizziness and giddiness: Secondary | ICD-10-CM

## 2011-08-31 NOTE — Patient Instructions (Signed)
Thank you for coming in today, it was good to see you I will send over another referral to ENT.  I would like for you to try and call to see if the previous referral is still good and you can go ahead and get an appt.

## 2011-09-05 ENCOUNTER — Other Ambulatory Visit: Payer: Self-pay | Admitting: Family Medicine

## 2011-09-09 ENCOUNTER — Ambulatory Visit: Payer: Self-pay

## 2011-09-11 NOTE — Progress Notes (Signed)
  Subjective:    Patient ID: Jessica Hale, female    DOB: [birth year], 36 y.o.   MRN: [id number]  HPI 1. Dizziness: Here to f/u on dizziness.  Continues to have dizziness associated with headaches, ringing ears and fullness of ears.  She was seen by ENT and Neurology around 2 months ago and was told this was atypical migraines and started on nortriptyline, no improvement since that time.  Has tried meclizine with no relief.  Has symptoms daily of "room spinning".  ENT considered placement of PE tubes about one year ago according to patient for peristent fluid behind TM.    Review of Systems Denies nausea, hearing loss.     Objective:   Physical Exam  Constitutional: She appears well-nourished. No distress.  HENT:  Head: Normocephalic and atraumatic.  Right Ear: Ear canal normal. Tympanic membrane is not injected and not bulging. A middle ear effusion is present.  Left Ear: Tympanic membrane and ear canal normal.  Mouth/Throat: Oropharynx is clear and moist.  Neurological: She displays a negative Romberg sign. Coordination and gait normal.          Assessment & Plan:

## 2011-09-11 NOTE — Assessment & Plan Note (Addendum)
Still no improvement despite trying multiple things.  I suggested she return back to ENT as she does have persistent middle ear effusion.  May benefit from PE tubes.

## 2011-09-23 ENCOUNTER — Ambulatory Visit (INDEPENDENT_AMBULATORY_CARE_PROVIDER_SITE_OTHER): Payer: Self-pay | Admitting: *Deleted

## 2011-09-23 DIAGNOSIS — Z228 Carrier of other infectious diseases: Secondary | ICD-10-CM

## 2011-09-23 DIAGNOSIS — Z9289 Personal history of other medical treatment: Secondary | ICD-10-CM

## 2011-09-23 NOTE — Progress Notes (Signed)
Patient in for PPD , however she reports  history of positive PPD. It is noted in centricity that  PPD from 08/10 was positive with 15 mm induration.  At that time patient had a chest  xray.  Advised will not give a PPD today. Consulted with Dr. McDiarmid and he advises that patient needs to check with her employer to see if chest x ray is needed to satisfy their records or if just a note from MD is all that may be needed. Appointment scheduled for Monday to see MD to verify  no symptoms of TB and to give her a note for her job.

## 2011-09-26 ENCOUNTER — Ambulatory Visit: Payer: Self-pay | Admitting: Family Medicine

## 2011-11-16 ENCOUNTER — Encounter: Payer: Self-pay | Admitting: Family Medicine

## 2012-01-11 ENCOUNTER — Ambulatory Visit (INDEPENDENT_AMBULATORY_CARE_PROVIDER_SITE_OTHER): Payer: No Typology Code available for payment source | Admitting: Family Medicine

## 2012-01-11 ENCOUNTER — Ambulatory Visit
Admission: RE | Admit: 2012-01-11 | Discharge: 2012-01-11 | Disposition: A | Discharge status: Home / Self Care | Payer: No Typology Code available for payment source | Source: Ambulatory Visit | Attending: Family Medicine | Admitting: Family Medicine

## 2012-01-11 VITALS — BP 152/98 | HR 76 | Temp 98.7°F | Ht 62.0 in | Wt 149.0 lb

## 2012-01-11 DIAGNOSIS — J029 Acute pharyngitis, unspecified: Secondary | ICD-10-CM

## 2012-01-11 DIAGNOSIS — R059 Cough, unspecified: Secondary | ICD-10-CM

## 2012-01-11 DIAGNOSIS — R05 Cough: Secondary | ICD-10-CM

## 2012-01-11 DIAGNOSIS — J069 Acute upper respiratory infection, unspecified: Secondary | ICD-10-CM

## 2012-01-11 LAB — POCT RAPID STREP A (OFFICE): Rapid Strep A Screen: NEGATIVE

## 2012-01-11 MED ORDER — METHYLPREDNISOLONE SODIUM SUCC 125 MG IJ SOLR
125.0000 mg | Freq: Once | INTRAMUSCULAR | Status: AC
  Administered 2012-01-11: 125 mg via INTRAMUSCULAR

## 2012-01-11 NOTE — Patient Instructions (Signed)
Viremia Your exam shows you have a viral illness. Viremia means your symptoms are due to the presence of the virus in your blood. This will often cause a chill or sweat. Other common symptoms of viral infections include fever, muscle aches, headache, fatigue, stomach upsets, sore throat, and dry cough. Antibiotics are not effective in viral illnesses; they are usually only given when there is a secondary bacterial infection. General treatment includes bed rest, increasing oral fluid intake of clear, non-caffeinated drinks like ginger ale, fruit juices, water, or sports drinks. Medicines to relieve specific symptoms such as cough, pain, or diarrhea may also be prescribed. Only take over-the-counter or prescription medicines for pain, discomfort, or fever as directed by your caregiver.  Please call your doctor if you are not better after 2 to 3 days of symptom treatment. Call or return here right away if your illness gets more severe, or you develop any other new symptoms, such as a fever above 103 F (39.4 C), vomiting for more than a day, severe headache or other pain, stiff neck, trouble breathing, visual problems, "blackouts" or fainting. Document Released: 08/11/2004 Document Revised: 06/23/2011 Document Reviewed: 07/04/2005 [facility] Patient Information 2012 [place], [place].

## 2012-01-12 DIAGNOSIS — J069 Acute upper respiratory infection, unspecified: Secondary | ICD-10-CM | POA: Insufficient documentation

## 2012-01-12 NOTE — Progress Notes (Signed)
  Subjective:    Patient ID: Jessica Hale, female    DOB: 1976/04/13, 36 y.o.   MRN: [id number]  HPI URI Symptoms Onset: 3-4 days  Description: + nasal congestion, rhinorrhea, cough, mild wheezing, sore throat  Modifying factors:  Smoker   Symptoms Nasal discharge: yes Fever: mild. Tmax 100.6  Sore throat: yes  Cough: yes Wheezing: yes Ear pain: no GI symptoms: no Sick contacts: yes; children   Red Flags  Stiff neck: no Dyspnea: no Rash: no Swallowing difficulty: no  Sinusitis Risk Factors Headache/face pain: no Double sickening: no tooth pain: no  Allergy Risk Factors Sneezing: no Itchy scratchy throat: no Seasonal symptoms: no  Flu Risk Factors Headache: no muscle aches: no severe fatigue: no     Review of Systems See HPI, otherwise ROS negative     Objective:   Physical Exam Gen: up in chair, NAD HEENT: NCAT, EOMI, TMs clear bilaterally, +nasal erythema, rhinorrhea bilaterally, + post oropharyngeal erythema  CV: RRR, no murmurs auscultated PULM: good overall air movement; faint rales and wheezes in L upper and lower lobes.  ABD: S/NT/+ bowel sounds  EXT: 2+ peripheral pulses   Assessment & Plan:

## 2012-01-12 NOTE — Assessment & Plan Note (Addendum)
Likely viral URI. Rapid strep negative. Given focal wheezes in L lung, will obtain CXR to rule PNA.  Solu-medrol 125 IM x 1.  Supportive care and infectious red flags.  Discussed smoking cessation.  Handout given.

## 2012-01-23 ENCOUNTER — Other Ambulatory Visit: Payer: Self-pay | Admitting: *Deleted

## 2012-01-23 MED ORDER — LISINOPRIL 20 MG PO TABS: 20.0000 mg | ORAL_TABLET | Freq: Every day | ORAL | Status: DC

## 2012-04-10 ENCOUNTER — Ambulatory Visit (INDEPENDENT_AMBULATORY_CARE_PROVIDER_SITE_OTHER): Payer: Self-pay | Admitting: Family Medicine

## 2012-04-10 ENCOUNTER — Encounter: Payer: Self-pay | Admitting: Family Medicine

## 2012-04-10 VITALS — BP 141/97 | HR 67 | Temp 98.6°F | Ht 62.0 in | Wt 145.9 lb

## 2012-04-10 DIAGNOSIS — J309 Allergic rhinitis, unspecified: Secondary | ICD-10-CM

## 2012-04-10 NOTE — Assessment & Plan Note (Signed)
I don't think this is continuation of her URI she had previously.  She has a history of asthma and allergies and given her exam with turbinate edema in her nose I think this is more likely allergic rhinitis.  I have advised her to use flonase daily and can use sudafed or antihistamine-sudafed combo prn.  Return if not improving.

## 2012-04-10 NOTE — Progress Notes (Signed)
  Subjective:    Patient ID: Jessica Hale, female    DOB: January 24, 1976, 36 y.o.   MRN: [id number]  HPI 1. Congestion: Patient states she is here for follow up of congestion.  Was last seen in June and she states that she has had continued cough and congestion since that time. She endorses a history of allergies and has been given flonase in the past but has not been using regularly.  She has tried other OTC cold medications without much relief.  Cough is non productive.  Does have mild sore throat. She denies fever, chills, chest pain, shortness of breath, nausea or vomiting.  Review of Systems Per HPI    Objective:   Physical Exam  Constitutional: She appears well-nourished. No distress.  HENT:  Head: Normocephalic and atraumatic.  Right Ear: External ear normal. A middle ear effusion is present.  Left Ear: External ear normal.  No middle ear effusion.  Nose: Mucosal edema and rhinorrhea present. Right sinus exhibits no maxillary sinus tenderness and no frontal sinus tenderness. Left sinus exhibits no maxillary sinus tenderness and no frontal sinus tenderness.  Mouth/Throat: Oropharynx is clear and moist.  Neck: Neck supple.  Cardiovascular: Normal rate and regular rhythm.   Pulmonary/Chest: Effort normal and breath sounds normal. She has no wheezes.  Lymphadenopathy:    She has no cervical adenopathy.          Assessment & Plan:

## 2012-04-10 NOTE — Patient Instructions (Addendum)
Thank you for coming in today, it was good to see you Use the flonase everyday You can try over the counter sudafed or any of the medications such as allegra-d, claritin-d, zyrtec-d have sudafed with them If you are not improving over the next couple of weeks please return to see me.

## 2012-05-17 ENCOUNTER — Ambulatory Visit
Admission: RE | Admit: 2012-05-17 | Discharge: 2012-05-17 | Disposition: A | Discharge status: Home / Self Care | Payer: No Typology Code available for payment source | Source: Ambulatory Visit | Attending: Family Medicine | Admitting: Family Medicine

## 2012-05-17 ENCOUNTER — Encounter: Payer: Self-pay | Admitting: Family Medicine

## 2012-05-17 ENCOUNTER — Other Ambulatory Visit: Payer: Self-pay | Admitting: Family Medicine

## 2012-05-17 ENCOUNTER — Ambulatory Visit (INDEPENDENT_AMBULATORY_CARE_PROVIDER_SITE_OTHER): Payer: No Typology Code available for payment source | Admitting: Family Medicine

## 2012-05-17 VITALS — BP 155/100 | HR 73 | Temp 98.1°F | Ht 62.0 in | Wt 145.0 lb

## 2012-05-17 DIAGNOSIS — R05 Cough: Secondary | ICD-10-CM

## 2012-05-17 DIAGNOSIS — R059 Cough, unspecified: Secondary | ICD-10-CM

## 2012-05-17 DIAGNOSIS — I10 Essential (primary) hypertension: Secondary | ICD-10-CM

## 2012-05-17 MED ORDER — ALBUTEROL SULFATE HFA 108 (90 BASE) MCG/ACT IN AERS: 2.0000 | INHALATION_SPRAY | Freq: Four times a day (QID) | RESPIRATORY_TRACT | Status: DC | PRN

## 2012-05-17 MED ORDER — AMLODIPINE BESYLATE 5 MG PO TABS: 5.0000 mg | ORAL_TABLET | Freq: Every day | ORAL | Status: DC

## 2012-05-17 NOTE — Patient Instructions (Addendum)
Thank you for coming in today, it was good to see you Stop taking lisinopril and start taking the new medication called amlodipine Go and get your chest x ray Start using your albuterol again Follow up in 2 weeks.

## 2012-05-20 NOTE — Assessment & Plan Note (Signed)
D/c lisinopril (? Contributing to cough) and start amlodipine.  Recheck BP in two weeks.

## 2012-05-20 NOTE — Assessment & Plan Note (Addendum)
Persistent cough, unsure of cause.  Will repeat CXR today .  Will also stop ACE-I to see if that may contributing, although she has been on this for a while.  Low suspicion of PE.  Will follow up in 2 weeks.

## 2012-05-20 NOTE — Progress Notes (Signed)
  Subjective:    Patient ID: Jessica Hale, female    DOB: August 29, 1975, 36 y.o.   MRN: [id number]  HPI 1. Cough:  Persistent cough x3 months.  Has been seen a few different times and tried on mutiple medications including albuterol and flonase with little relief.  Cough is typically dry, sometimes some mucus production.  Has noticed some occasional wheezing related to cough.  Negative CXR previously.  She has cut back on her smoking.  She does endorse some pain in her R shoulder blade.  She denies fever, chills, nausea or vomiting, chest pain, weight loss.    Review of Systems Per HPI    Objective:   Physical Exam  Constitutional: She appears well-nourished. No distress.  HENT:  Head: Normocephalic and atraumatic.  Neck: Neck supple.  Cardiovascular: Normal rate, regular rhythm and normal heart sounds.   Pulmonary/Chest: Effort normal and breath sounds normal. No respiratory distress. She has no wheezes.  Musculoskeletal: She exhibits no edema and no tenderness.          Assessment & Plan:

## 2012-05-25 ENCOUNTER — Telehealth: Payer: Self-pay | Admitting: Family Medicine

## 2012-05-25 MED ORDER — BENZONATATE 100 MG PO CAPS: 100.0000 mg | ORAL_CAPSULE | Freq: Three times a day (TID) | ORAL | Status: DC | PRN

## 2012-05-25 NOTE — Telephone Encounter (Signed)
I have discussed this with Thekla and have sent her an Rx for tessalon pearls.

## 2012-05-25 NOTE — Telephone Encounter (Signed)
Patient is calling for her results. 

## 2012-05-25 NOTE — Telephone Encounter (Signed)
Called pt. Informed of CXR results. Negative for pneumonia, but may indicate bronchitis. Pt reports, that she is still coughing (some productive), denies fever. Request for medications to be send to her pharmacy. [facility] [place]. Will fwd. To Dr.Bradshaw .Arlyss Repress

## 2012-06-18 ENCOUNTER — Encounter: Payer: Self-pay | Admitting: Home Health Services

## 2012-09-09 ENCOUNTER — Other Ambulatory Visit: Payer: Self-pay | Admitting: Family Medicine

## 2012-10-15 ENCOUNTER — Other Ambulatory Visit: Payer: Self-pay | Admitting: *Deleted

## 2012-10-15 MED ORDER — AMLODIPINE BESYLATE 5 MG PO TABS: 5.0000 mg | ORAL_TABLET | Freq: Every day | ORAL | Status: DC

## 2012-11-16 ENCOUNTER — Ambulatory Visit (INDEPENDENT_AMBULATORY_CARE_PROVIDER_SITE_OTHER): Payer: 59 | Admitting: Family Medicine

## 2012-11-16 ENCOUNTER — Encounter: Payer: Self-pay | Admitting: Family Medicine

## 2012-11-16 VITALS — BP 146/89 | HR 63 | Ht 62.0 in | Wt 151.9 lb

## 2012-11-16 DIAGNOSIS — I10 Essential (primary) hypertension: Secondary | ICD-10-CM

## 2012-11-16 DIAGNOSIS — Z0000 Encounter for general adult medical examination without abnormal findings: Secondary | ICD-10-CM

## 2012-11-16 LAB — BASIC METABOLIC PANEL
BUN: 10 mg/dL (ref 6–23)
Chloride: 106 mEq/L (ref 96–112)
Glucose, Bld: 72 mg/dL (ref 70–99)
Potassium: 4.2 mEq/L (ref 3.5–5.3)

## 2012-11-16 LAB — CBC
MCH: 30.1 pg (ref 26.0–34.0)
MCV: 89.2 fL (ref 78.0–100.0)
Platelets: 336 10*3/uL (ref 150–400)
RDW: 15.3 % (ref 11.5–15.5)

## 2012-11-16 MED ORDER — ZOLPIDEM TARTRATE 5 MG PO TABS: 5.0000 mg | ORAL_TABLET | Freq: Every evening | ORAL | Status: DC | PRN

## 2012-11-16 MED ORDER — AMLODIPINE BESYLATE 10 MG PO TABS: 10.0000 mg | ORAL_TABLET | Freq: Every day | ORAL | Status: DC

## 2012-11-16 NOTE — Assessment & Plan Note (Signed)
Increase amlodipine to 10 mg

## 2012-11-16 NOTE — Progress Notes (Signed)
Patient ID: Jessica Hale, female   DOB: March 21, 1976, 37 y.o.   MRN: [id number] SUBJECTIVE:  37 y.o. female for annual routine checkup  Reports frequent cramping in her legs, especially at night.  Able to walk without difficulty or cramping.   Current Outpatient Prescriptions  Medication Sig Dispense Refill  . albuterol (PROVENTIL HFA;VENTOLIN HFA) 108 (90 BASE) MCG/ACT inhaler Inhale 2 puffs into the lungs every 6 (six) hours as needed for wheezing.  1 Inhaler  1  . albuterol (PROVENTIL,VENTOLIN) 90 MCG/ACT inhaler Inhale 2 puffs into the lungs every 4 (four) hours.       Marland Kitchen amLODipine (NORVASC) 10 MG tablet Take 1 tablet (10 mg total) by mouth daily.  90 tablet  0  . benzonatate (TESSALON PERLES) 100 MG capsule Take 1 capsule (100 mg total) by mouth 3 (three) times daily as needed for cough.  20 capsule  0  . fluticasone (FLONASE) 50 MCG/ACT nasal spray Place 2 sprays into the nose daily.  16 g  6  . metoprolol (LOPRESSOR) 100 MG tablet TAKE ONE TABLET BY MOUTH TWICE DAILY  60 tablet  5  . nortriptyline (PAMELOR) 10 MG capsule Take 10 mg by mouth at bedtime.      Marland Kitchen zolpidem (AMBIEN) 5 MG tablet Take 1 tablet (5 mg total) by mouth at bedtime as needed.  30 tablet  3   No current facility-administered medications for this visit.   Allergies: Review of patient's allergies indicates no known allergies.  Patient's last menstrual period was 10/30/2012.  ROS:  Feeling well. No dyspnea or chest pain on exertion.  No abdominal pain, change in bowel habits, black or bloody stools.  No urinary tract symptoms. GYN ROS: normal menses, no abnormal bleeding, pelvic pain or discharge. No neurological complaints.  OBJECTIVE:  The patient appears well, alert, oriented x 3, in no distress. BP 146/89  Pulse 63  Ht 5\' 2"  (1.575 m)  Wt 151 lb 14.4 oz (68.901 kg)  BMI 27.78 kg/m2  LMP 10/30/2012 ENT normal.  Neck supple. No adenopathy or thyromegaly. PERLA. Lungs are clear, good air entry, no wheezes,  rhonchi or rales. S1 and S2 normal, no murmurs, regular rate and rhythm. Abdomen soft without tenderness, guarding, mass or organomegaly. Extremities show no edema, normal peripheral pulses. Neurological is normal, no focal findings.   ASSESSMENT:  well woman  PLAN:  pap smear- next one due in 2015 additional lab tests per orders return annually or prn  HTN: BP elevated, increase amlodipine to 10mg .

## 2012-11-16 NOTE — Patient Instructions (Addendum)
Thank you for coming in today, it was good to see you A b complex vitamin may be helpful for your leg cramps I would like for you to increase your amlodipine to 10mg , I have sent in a new prescription.   Follow up if your leg cramps continue to be bothersome

## 2012-11-27 ENCOUNTER — Telehealth: Payer: Self-pay | Admitting: Family Medicine

## 2012-11-27 NOTE — Telephone Encounter (Signed)
Pt states her pharmacy has not received the prescription of  Ambien issued 11-16-12. She also wants to speak with nurse about blood pressure meds. Pharmacy is [facility] [address]

## 2012-11-27 NOTE — Telephone Encounter (Signed)
Called pt. Jessica Hale was called in 11/16/12 and norvasc was sent electronically. Pt said, that she is at the pharmacy and meds are there. I told the pt to call back, if needed. Pt agreed. Lorenda Hatchet, Renato Battles

## 2012-12-17 ENCOUNTER — Other Ambulatory Visit: Payer: Self-pay | Admitting: Family Medicine

## 2012-12-25 ENCOUNTER — Encounter: Payer: Self-pay | Admitting: Family Medicine

## 2012-12-25 ENCOUNTER — Ambulatory Visit (INDEPENDENT_AMBULATORY_CARE_PROVIDER_SITE_OTHER): Payer: 59 | Admitting: Family Medicine

## 2012-12-25 VITALS — BP 129/83 | HR 71 | Temp 98.7°F | Ht 62.0 in | Wt 160.0 lb

## 2012-12-25 DIAGNOSIS — R197 Diarrhea, unspecified: Secondary | ICD-10-CM | POA: Insufficient documentation

## 2012-12-25 DIAGNOSIS — R1110 Vomiting, unspecified: Secondary | ICD-10-CM

## 2012-12-25 MED ORDER — ONDANSETRON 4 MG PO TBDP: 4.0000 mg | ORAL_TABLET | Freq: Three times a day (TID) | ORAL | Status: DC | PRN

## 2012-12-25 NOTE — Progress Notes (Signed)
  Subjective:    Patient ID: Jessica Hale, female    DOB: 05/31/1976, 37 y.o.   MRN: [id number]  HPI  Jessica Hale comes in with vomiting and diarrhea for 3 days.  She says she started with diarrhea first, then had vomiting the past two days.  She has had headaches, body aches, crampy abdominal pain, and chills.  She says two of her co-workers have also been sick.  Denies any blood or mucous in diarrhea, says it is watery.  Denies any bilious or bloody vomiting.  Is not keeping down much food.   Review of Systems See HPI    Objective:   Physical Exam BP 129/83  Pulse 71  Temp(Src) 98.7 F (37.1 C) (Oral)  Ht 5\' 2"  (1.575 m)  Wt 160 lb (72.576 kg)  BMI 29.26 kg/m2 General appearance: alert, cooperative and no distress Throat: oral mucosa moist, nolesions Lungs: clear to auscultation bilaterally Heart: regular rate and rhythm, S1, S2 normal, no murmur, click, rub or gallop Abdomen: mildly hyperactive BS, abdomen soft, mild TTP diffusely, no rebound, guarding, or organomegaly.  Pulses: 2+ and symmetric      Assessment & Plan:

## 2012-12-25 NOTE — Patient Instructions (Signed)
Viral Gastroenteritis Viral gastroenteritis is also known as stomach flu. This condition affects the stomach and intestinal tract. It can cause sudden diarrhea and vomiting. The illness typically lasts 3 to 8 days. Most people develop an immune response that eventually gets rid of the virus. While this natural response develops, the virus can make you quite ill. CAUSES  Many different viruses can cause gastroenteritis, such as rotavirus or noroviruses. You can catch one of these viruses by consuming contaminated food or water. You may also catch a virus by sharing utensils or other personal items with an infected person or by touching a contaminated surface. SYMPTOMS  The most common symptoms are diarrhea and vomiting. These problems can cause a severe loss of body fluids (dehydration) and a body salt (electrolyte) imbalance. Other symptoms may include:  Fever.  Headache.  Fatigue.  Abdominal pain. DIAGNOSIS  Your caregiver can usually diagnose viral gastroenteritis based on your symptoms and a physical exam. A stool sample may also be taken to test for the presence of viruses or other infections. TREATMENT  This illness typically goes away on its own. Treatments are aimed at rehydration. The most serious cases of viral gastroenteritis involve vomiting so severely that you are not able to keep fluids down. In these cases, fluids must be given through an intravenous line (IV). HOME CARE INSTRUCTIONS   Drink enough fluids to keep your urine clear or pale yellow. Drink small amounts of fluids frequently and increase the amounts as tolerated.  Ask your caregiver for specific rehydration instructions.  Avoid:  Foods high in sugar.  Alcohol.  Carbonated drinks.  Tobacco.  Juice.  Caffeine drinks.  Extremely hot or cold fluids.  Fatty, greasy foods.  Too much intake of anything at one time.  Dairy products until 24 to 48 hours after diarrhea stops.  You may consume probiotics.  Probiotics are active cultures of beneficial bacteria. They may lessen the amount and number of diarrheal stools in adults. Probiotics can be found in yogurt with active cultures and in supplements.  Wash your hands well to avoid spreading the virus.  Only take over-the-counter or prescription medicines for pain, discomfort, or fever as directed by your caregiver. Do not give aspirin to children. Antidiarrheal medicines are not recommended.  Ask your caregiver if you should continue to take your regular prescribed and over-the-counter medicines.  Keep all follow-up appointments as directed by your caregiver. SEEK IMMEDIATE MEDICAL CARE IF:   You are unable to keep fluids down.  You do not urinate at least once every 6 to 8 hours.  You develop shortness of breath.  You notice blood in your stool or vomit. This may look like coffee grounds.  You have abdominal pain that increases or is concentrated in one small area (localized).  You have persistent vomiting or diarrhea.  You have a fever.  The patient is a child younger than 3 months, and he or she has a fever.  The patient is a child older than 3 months, and he or she has a fever and persistent symptoms.  The patient is a child older than 3 months, and he or she has a fever and symptoms suddenly get worse.  The patient is a baby, and he or she has no tears when crying. MAKE SURE YOU:   Understand these instructions.  Will watch your condition.  Will get help right away if you are not doing well or get worse. Document Released: 07/04/2005 Document Revised: 09/26/2011 Document Reviewed: 04/20/2011   [employer] Patient Information 2014 [employer].  

## 2012-12-25 NOTE — Assessment & Plan Note (Signed)
Most likely viral gastroenteritis.  Rx for zofran to control nausea. Discussed oral hydration, symptomatic care.  Wrote note for out of work.  Advised to follow up if not improving in next 3-4 days.

## 2013-01-11 ENCOUNTER — Ambulatory Visit: Payer: 59 | Admitting: Family Medicine

## 2013-01-11 ENCOUNTER — Ambulatory Visit: Payer: 59

## 2013-01-14 ENCOUNTER — Other Ambulatory Visit: Payer: Self-pay | Admitting: Family Medicine

## 2013-01-25 ENCOUNTER — Encounter: Payer: Self-pay | Admitting: Family Medicine

## 2013-01-25 ENCOUNTER — Ambulatory Visit (INDEPENDENT_AMBULATORY_CARE_PROVIDER_SITE_OTHER): Payer: 59 | Admitting: Family Medicine

## 2013-01-25 VITALS — BP 129/88 | HR 63 | Ht 62.0 in | Wt 160.0 lb

## 2013-01-25 DIAGNOSIS — H8109 Meniere's disease, unspecified ear: Secondary | ICD-10-CM

## 2013-01-25 DIAGNOSIS — H8103 Meniere's disease, bilateral: Secondary | ICD-10-CM

## 2013-01-25 DIAGNOSIS — G43009 Migraine without aura, not intractable, without status migrainosus: Secondary | ICD-10-CM

## 2013-01-25 DIAGNOSIS — I10 Essential (primary) hypertension: Secondary | ICD-10-CM

## 2013-01-25 DIAGNOSIS — G43809 Other migraine, not intractable, without status migrainosus: Secondary | ICD-10-CM

## 2013-01-25 MED ORDER — IBUPROFEN 800 MG PO TABS: 800.0000 mg | ORAL_TABLET | Freq: Four times a day (QID) | ORAL | Status: DC | PRN

## 2013-01-25 NOTE — Progress Notes (Signed)
Subjective:     Patient ID: Jessica Hale, female   DOB: [birth year], 37 y.o.   MRN: [id number]  HPI  HEADACHE Complains of worsening headaches for 2 months. Description of pain: sharp, stabbing, throbbing, unilateral in the left occipital area, and behind her eye. Duration of individual headaches: 1-2 minutes for sharp pain, but persistent symptoms for several hours Frequency most days.  Associated symptoms: aura, light sensitivity, nausea and visual disturbance.  Pain relief: acetaminophen, lying in a darkened room and sleep.  Precipitating factors: sun exposure. She denies a history of recent head injury.  Prior neurological history: negative for no neurological problems. Neurologic Review of Systems - no TIA or stroke-like symptoms, no amaurosis, diplopia, abnormal speech, unilateral numbness or weakness.  Hx of atypical migraines in past. Current BP meds also for migraine prophylaxis (Metoprolol, Amlodipine)  HYPERTENSION Disease Monitoring - today BP 129/88 Home BP Monitoring - no Chest pain- no    Dyspnea- no Medications- Metoprolol, Amlodipine Compliance-  yes. Lightheadedness-  no  Edema- no  PMH - reported hx of Meniere's disease per ENT in past. Chronic issue, still experiences problems, previously took Nortryptyline, unsure if provided relief.  Social Hx - current smoker, <1 ppd, No EtOH   Review of Systems  See above HPI.     Objective:   Physical Exam  BP 129/88  Pulse 63  Ht 5\' 2"  (1.575 m)  Wt 160 lb (72.576 kg)  BMI 29.26 kg/m2  General - pleasant, WDWN, NAD HEENT - PERRLA, EOMI, no facial pain on palpation Neck:  No deformities, thyromegaly, masses, or tenderness noted.   Supple with full range of motion without pain. Heart - Regular rate and rhythm.  No murmurs, gallops or rubs.    Lungs:  Normal respiratory effort, chest expands symmetrically. Lungs are clear to auscultation, no crackles or wheezes. Neuro - Alert and oriented to person, place, and  date.  CN II-XII intact.  No focal deficits noted. Peripheral sensation to light touch intact, normal muscle str bilaterally 5/5

## 2013-01-25 NOTE — Assessment & Plan Note (Signed)
Patient reported chronic Hx of inner ear problems with ear pain, fullness, vertigo. Previously diagnosed with Meniere's Disease by ENT. No longer follows ENT or Neuro at this time.  No acute complaints today, but notes this is a chronic issue. Discussed possibility in future of adding HCTZ to treat, and help BP as well. Patient agrees, and will re-visit this further at a later visit.

## 2013-01-25 NOTE — Assessment & Plan Note (Signed)
Reports worsening of Migraine Headache symptoms for past 2 months. Described as unilateral (L > R), sharp, shooting/throbbing pain, can last for hours, assoc with photophobia, seeing stars, nausea. Onset usually outside. Currently on migraine prophylaxis (BP meds) - Metoprolol 100mg  BID, Amlodipine 10mg  daily. Advised to continue at this time.  Started on high dose Ibuprofen 800mg  q 6-8 hrs PRN severe headache only, as abortive therapy. For diagnostic/therapeutic reasons. If helping then most likely migraines, will re-evaluate prophylaxis and consider Topamax or adding back Nortrptyline. If Ibuprofen not successful, discussed possibility of trying Imitrex as abortive therapy.  Will record Headache Log to better identify possible trigger. Encouraged to bring to next visit. Follow-up in 1-2 months, or sooner if worsening.

## 2013-01-25 NOTE — Patient Instructions (Addendum)
Today we discussed your headaches. I believe that they are most likely Migraine Headaches, but we will know more later once you've tried the treatment to see if it is helping or not.  Some important numbers from today's visit: BP - 129/88 (Your BP continues to do well!) Keep taking your BP meds, reduce salt intake, continue regular physical exercise!  We started a new medication today to help your migraine headaches. Ibuprofen, please take one 800mg  tablet WITH FOOD at the time of a bad headache episode. You may take it every 6 to 8 hours, no more than 4 per 24 hour period. This is considered "Abortive Therapy", if it helps relieve the headache, then it is most likely a migraine headache. If this medication does not work, then please call or come in to see me and we can try Imitrex (Sumatriptan) to also "Abort" your migraine headaches.  Please keep a Headache Log of when / where / what you are doing / severity / other symptoms of your headaches for the next few weeks to 1 month. Bring this to your next appointment. It will help [place] identify possible triggers, which can include stress, different foods, caffeine, and even your menstrual cycle.  We also discussed the possibility of adding a Thiazide (HCTZ) to help your blood pressure, but mainly improve your inner ear symptoms caused by too much fluid. This is something we can further discuss and try at a later time.  Please call to schedule a follow-up appointment in 1-2 months.  If you have any other questions or concerns, please feel free to call the clinic to contact me. You may also schedule another appointment if necessary.  However, if your symptoms get significantly worse, please go to the Emergency Department to seek immediate medical attention.  Saralyn Pilar, DO [facility]   Migraine Headache A migraine headache is an intense, throbbing pain on one or both sides of your head. A migraine can last for 30 minutes  to several hours. CAUSES  The exact cause of a migraine headache is not always known. However, a migraine may be caused when nerves in the brain become irritated and release chemicals that cause inflammation. This causes pain. SYMPTOMS  Pain on one or both sides of your head.  Pulsating or throbbing pain.  Severe pain that prevents daily activities.  Pain that is aggravated by any physical activity.  Nausea, vomiting, or both.  Dizziness.  Pain with exposure to bright lights, loud noises, or activity.  General sensitivity to bright lights, loud noises, or smells. Before you get a migraine, you may get warning signs that a migraine is coming (aura). An aura may include:  Seeing flashing lights.  Seeing bright spots, halos, or zig-zag lines.  Having tunnel vision or blurred vision.  Having feelings of numbness or tingling.  Having trouble talking.  Having muscle weakness. MIGRAINE TRIGGERS  Alcohol.  Smoking.  Stress.  Menstruation.  Aged cheeses.  Foods or drinks that contain nitrates, glutamate, aspartame, or tyramine.  Lack of sleep.  Chocolate.  Caffeine.  Hunger.  Physical exertion.  Fatigue.  Medicines used to treat chest pain (nitroglycerine), birth control pills, estrogen, and some blood pressure medicines. DIAGNOSIS  A migraine headache is often diagnosed based on:  Symptoms.  Physical examination.  A CT scan or MRI of your head. TREATMENT Medicines may be given for pain and nausea. Medicines can also be given to help prevent recurrent migraines.  HOME CARE INSTRUCTIONS  Only take  over-the-counter or prescription medicines for pain or discomfort as directed by your caregiver. The use of long-term narcotics is not recommended.  Lie down in a dark, quiet room when you have a migraine.  Keep a journal to find out what may trigger your migraine headaches. For example, write down:  What you eat and drink.  How much sleep you  get.  Any change to your diet or medicines.  Limit alcohol consumption.  Quit smoking if you smoke.  Get 7 to 9 hours of sleep, or as recommended by your caregiver.  Limit stress.  Keep lights dim if bright lights bother you and make your migraines worse. SEEK IMMEDIATE MEDICAL CARE IF:   Your migraine becomes severe.  You have a fever.  You have a stiff neck.  You have vision loss.  You have muscular weakness or loss of muscle control.  You start losing your balance or have trouble walking.  You feel faint or pass out.  You have severe symptoms that are different from your first symptoms. MAKE SURE YOU:   Understand these instructions.  Will watch your condition.  Will get help right away if you are not doing well or get worse. Document Released: 07/04/2005 Document Revised: 09/26/2011 Document Reviewed: 06/24/2011 [facility] Patient Information 2014 [place], [place]. Ibuprofen tablets and capsules What is this medicine? IBUPROFEN (eye BYOO proe fen) is a non-steroidal anti-inflammatory drug (NSAID). It is used for dental pain, fever, headaches or migraines, osteoarthritis, rheumatoid arthritis, or painful monthly periods. It can also relieve minor aches and pains caused by a cold, flu, or sore throat. This medicine may be used for other purposes; ask your health care provider or pharmacist if you have questions. What should I tell my health care provider before I take this medicine? They need to know if you have any of these conditions: -asthma -cigarette smoker -drink more than 3 alcohol containing drinks a day -heart disease or circulation problems such as heart failure or leg edema (fluid retention) -high blood pressure -kidney disease -liver disease -stomach bleeding or ulcers -an unusual or allergic reaction to ibuprofen, aspirin, other NSAIDS, other medicines, foods, dyes, or preservatives -pregnant or trying to get pregnant -breast-feeding How should I  use this medicine? Take this medicine by mouth with a glass of water. Follow the directions on the prescription label. Take this medicine with food if your stomach gets upset. Try to not lie down for at least 10 minutes after you take the medicine. Take your medicine at regular intervals. Do not take your medicine more often than directed. A special MedGuide will be given to you by the pharmacist with each prescription and refill. Be sure to read this information carefully each time. Talk to your pediatrician regarding the use of this medicine in children. Special care may be needed. Overdosage: If you think you have taken too much of this medicine contact a poison control center or emergency room at once. NOTE: This medicine is only for you. Do not share this medicine with others. What if I miss a dose? If you miss a dose, take it as soon as you can. If it is almost time for your next dose, take only that dose. Do not take double or extra doses. What may interact with this medicine? Do not take this medicine with any of the following medications: -cidofovir -ketorolac -methotrexate -pemetrexed This medicine may also interact with the following medications: -alcohol -aspirin -diuretics -lithium -other drugs for inflammation like prednisone -warfarin This list  may not describe all possible interactions. Give your health care provider a list of all the medicines, herbs, non-prescription drugs, or dietary supplements you use. Also tell them if you smoke, drink alcohol, or use illegal drugs. Some items may interact with your medicine. What should I watch for while using this medicine? Tell your doctor or healthcare professional if your symptoms do not start to get better or if they get worse. This medicine does not prevent heart attack or stroke. In fact, this medicine may increase the chance of a heart attack or stroke. The chance may increase with longer use of this medicine and in people who  have heart disease. If you take aspirin to prevent heart attack or stroke, talk with your doctor or health care professional. Do not take other medicines that contain aspirin, ibuprofen, or naproxen with this medicine. Side effects such as stomach upset, nausea, or ulcers may be more likely to occur. Many medicines available without a prescription should not be taken with this medicine. This medicine can cause ulcers and bleeding in the stomach and intestines at any time during treatment. Ulcers and bleeding can happen without warning symptoms and can cause death. To reduce your risk, do not smoke cigarettes or drink alcohol while you are taking this medicine. You may get drowsy or dizzy. Do not drive, use machinery, or do anything that needs mental alertness until you know how this medicine affects you. Do not stand or sit up quickly, especially if you are an older patient. This reduces the risk of dizzy or fainting spells. This medicine can cause you to bleed more easily. Try to avoid damage to your teeth and gums when you brush or floss your teeth. What side effects may I notice from receiving this medicine? Side effects that you should report to your doctor or health care professional as soon as possible: -allergic reactions like skin rash, itching or hives, swelling of the face, lips, or tongue -black or bloody stools, blood in the urine or in vomit -breathing problems -changes in vision -chest pain -general ill feeling or flu-like symptoms -nausea or vomiting -redness, blistering, peeling or loosening of the skin, including inside the mouth -slurred speech or weakness on one side of the body -stomach pain -unexplained weight gain or swelling -unusually weak or tired -yellowing of eyes or skin Side effects that usually do not require medical attention (report to your doctor or health care professional if they continue or are bothersome): -constipation or diarrhea -dizziness -gas or  heartburn -stomach upset This list may not describe all possible side effects. Call your doctor for medical advice about side effects. You may report side effects to FDA at 1-800-FDA-1088. Where should I keep my medicine? Keep out of the reach of children. Store at room temperature between 15 and 30 degrees C (59 and 86 degrees F). Keep container tightly closed. Throw away any unused medicine after the expiration date. NOTE: This sheet is a summary. It may not cover all possible information. If you have questions about this medicine, talk to your doctor, pharmacist, or health care provider.  2013, [employer]. (07/06/2009 7:52:06 PM)

## 2013-01-25 NOTE — Assessment & Plan Note (Signed)
BP Stable. Today 129/88, HR 63. Continuing on Metoprolol 100mg  BID, Amlodipine 10mg  daily. Tolerating meds well.  Discussed possibility of adding HCTZ (pt was previously on a while ago), to help BP and also possible treatment for Meniere's.

## 2013-03-05 ENCOUNTER — Emergency Department (INDEPENDENT_AMBULATORY_CARE_PROVIDER_SITE_OTHER): Payer: 59

## 2013-03-05 ENCOUNTER — Emergency Department (HOSPITAL_COMMUNITY)
Admission: EM | Admit: 2013-03-05 | Discharge: 2013-03-05 | Disposition: A | Discharge status: Home / Self Care | Payer: 59 | Source: Home / Self Care

## 2013-03-05 ENCOUNTER — Encounter (HOSPITAL_COMMUNITY): Payer: Self-pay | Admitting: Emergency Medicine

## 2013-03-05 DIAGNOSIS — J45901 Unspecified asthma with (acute) exacerbation: Secondary | ICD-10-CM

## 2013-03-05 LAB — POCT I-STAT, CHEM 8
Calcium, Ion: 1.24 mmol/L — ABNORMAL HIGH (ref 1.12–1.23)
HCT: 42 % (ref 36.0–46.0)
Hemoglobin: 14.3 g/dL (ref 12.0–15.0)
Sodium: 139 mEq/L (ref 135–145)
TCO2: 22 mmol/L (ref 0–100)

## 2013-03-05 MED ORDER — IPRATROPIUM-ALBUTEROL 0.5-2.5 (3) MG/3ML IN SOLN
3.0000 mL | RESPIRATORY_TRACT | Status: DC
  Administered 2013-03-05: 3 mL via RESPIRATORY_TRACT

## 2013-03-05 MED ORDER — ALBUTEROL SULFATE (2.5 MG/3ML) 0.083% IN NEBU: 2.5000 mg | INHALATION_SOLUTION | Freq: Four times a day (QID) | RESPIRATORY_TRACT | Status: DC | PRN

## 2013-03-05 MED ORDER — PREDNISONE 50 MG PO TABS: ORAL_TABLET | ORAL | Status: DC

## 2013-03-05 MED ORDER — METHYLPREDNISOLONE SODIUM SUCC 125 MG IJ SOLR: 125.0000 mg | Freq: Once | INTRAMUSCULAR | Status: DC

## 2013-03-05 MED ORDER — CETIRIZINE HCL 10 MG PO TABS: 10.0000 mg | ORAL_TABLET | Freq: Every day | ORAL | Status: DC

## 2013-03-05 MED ORDER — ALBUTEROL SULFATE (5 MG/ML) 0.5% IN NEBU
INHALATION_SOLUTION | RESPIRATORY_TRACT | Status: AC
  Filled 2013-03-05: qty 1

## 2013-03-05 MED ORDER — PREDNISONE 20 MG PO TABS: 60.0000 mg | ORAL_TABLET | Freq: Every day | ORAL | Status: DC

## 2013-03-05 MED ORDER — PREDNISONE 20 MG PO TABS
60.0000 mg | ORAL_TABLET | Freq: Once | ORAL | Status: AC
  Administered 2013-03-05: 60 mg via ORAL

## 2013-03-05 MED ORDER — PREDNISONE 20 MG PO TABS
ORAL_TABLET | ORAL | Status: AC
  Filled 2013-03-05: qty 3

## 2013-03-05 NOTE — ED Provider Notes (Signed)
CSN: [id number]     Arrival date & time 03/05/13  0802 History     None    Chief Complaint  Patient presents with  . Shortness of Breath    since yesterday. hx of asthma.   (Consider location/radiation/quality/duration/timing/severity/associated sxs/prior Treatment) HPI Comments: 37 year old female presents complaining of shortness of breath, pleuritic chest pain, wheezing. This started after she developed a mild cough and has been going on since yesterday. She has a history of asthma but this does not seem to be getting better as her albuterol inhaler. The pleuritic pain is substernal it with inspiration. She denies any fever, chills, diaphoresis, radiation of pain, nausea, vomiting, recent airplane travel or long car trips. She does smoke, does not take hormonal birth control pills.  Patient is a 37 y.o. female presenting with shortness of breath.  Shortness of Breath Associated symptoms: chest pain, cough and wheezing   Associated symptoms: no abdominal pain, no fever, no rash and no vomiting     Past Medical History  Diagnosis Date  . Substance abuse   . Hypertension   . Asthma    Past Surgical History  Procedure Laterality Date  . Knee surgery     History reviewed. No pertinent family history. History  Substance Use Topics  . Smoking status: Current Every Day Smoker -- 0.30 packs/day    Types: Cigars  . Smokeless tobacco: Not on file     Comment: decreased smoking/ uses e-cigarette  . Alcohol Use: No   OB History   Grav Para Term Preterm Abortions TAB SAB Ect Mult Living                 Review of Systems  Constitutional: Negative for fever and chills.  Eyes: Negative for visual disturbance.  Respiratory: Positive for cough, shortness of breath and wheezing.   Cardiovascular: Positive for chest pain. Negative for palpitations and leg swelling.  Gastrointestinal: Negative for nausea, vomiting and abdominal pain.  Endocrine: Negative for polydipsia and polyuria.   Genitourinary: Negative for dysuria, urgency and frequency.  Musculoskeletal: Negative for myalgias and arthralgias.  Skin: Negative for rash.  Neurological: Negative for dizziness, weakness and light-headedness.    Allergies  Review of patient's allergies indicates no known allergies.  Home Medications   Current Outpatient Rx  Name  Route  Sig  Dispense  Refill  . albuterol (PROVENTIL HFA;VENTOLIN HFA) 108 (90 BASE) MCG/ACT inhaler   Inhalation   Inhale 2 puffs into the lungs every 6 (six) hours as needed for wheezing.   1 Inhaler   1   . amLODipine (NORVASC) 10 MG tablet   Oral   Take 1 tablet (10 mg total) by mouth daily.   90 tablet   0   . metoprolol (LOPRESSOR) 100 MG tablet      TAKE ONE TABLET BY MOUTH TWICE DAILY   60 tablet   2   . nortriptyline (PAMELOR) 10 MG capsule   Oral   Take 10 mg by mouth at bedtime.         Marland Kitchen albuterol (PROVENTIL) (2.5 MG/3ML) 0.083% nebulizer solution   Nebulization   Take 3 mL (2.5 mg total) by nebulization every 6 (six) hours as needed for wheezing.   75 mL   12     Please administer 1 box of 3 ml vials for nebulize ...   . cetirizine (ZYRTEC) 10 MG tablet   Oral   Take 1 tablet (10 mg total) by mouth daily.   30  tablet   0   . ibuprofen (ADVIL,MOTRIN) 800 MG tablet   Oral   Take 1 tablet (800 mg total) by mouth every 6 (six) hours as needed for pain (Only take at start of severe headache).   30 tablet   1   . ondansetron (ZOFRAN ODT) 4 MG disintegrating tablet   Oral   Take 1 tablet (4 mg total) by mouth every 8 (eight) hours as needed for nausea.   20 tablet   0   . predniSONE (DELTASONE) 50 MG tablet      1 tab PO QD   3 tablet   0   . zolpidem (AMBIEN) 5 MG tablet   Oral   Take 1 tablet (5 mg total) by mouth at bedtime as needed.   30 tablet   3    BP 124/89  Pulse 66  Temp(Src) 98.1 F (36.7 C) (Oral)  Resp 16  SpO2 98%  LMP 03/01/2013 Physical Exam  Nursing note and vitals  reviewed. Constitutional: She is oriented to person, place, and time. Vital signs are normal. She appears well-developed and well-nourished. No distress.  HENT:  Head: Normocephalic and atraumatic.  Eyes: EOM are normal. Pupils are equal, round, and reactive to light.  Cardiovascular: Normal rate, regular rhythm and normal heart sounds.  Exam reveals no gallop and no friction rub.   No murmur heard. Pulmonary/Chest: Effort normal. No respiratory distress. She has wheezes (diffuse). She has no rales. She exhibits no tenderness.  Abdominal: Soft. There is no tenderness.  Neurological: She is alert and oriented to person, place, and time. She has normal strength. Coordination normal.  Skin: Skin is warm and dry. She is not diaphoretic.  Psychiatric: She has a normal mood and affect. Her behavior is normal. Judgment normal.    ED Course   Procedures (including critical care time)  Labs Reviewed  POCT I-STAT, CHEM 8 - Abnormal; Notable for the following:    Calcium, Ion 1.24 (*)    All other components within normal limits   Dg Chest 2 View  03/05/2013   *RADIOLOGY REPORT*  Clinical Data: Shortness of breath  CHEST - 2 VIEW  Comparison:  May 17, 2012  Findings:  Lungs clear.  Heart size and pulmonary vascularity normal.  No adenopathy.  No bone lesions.  IMPRESSION: No edema or consolidation.   Original Report Authenticated By: Bretta Bang, M.D.   1. Asthma exacerbation     EKG: NSR, normal IStat: normal  CXR: normal   Significant improvement with 1 duoneb  MDM  This is an asthma exacerbation.  All SOB and pleuritic pain/tightness resolved with breathing treatment.  Will use short course of steroids and f/u if not improving, or if pleuritic pain returns    Meds ordered this encounter  Medications  . ipratropium-albuterol (DUONEB) 0.5-2.5 (3) MG/3ML nebulizer solution 3 mL    Sig:   . DISCONTD: methylPREDNISolone sodium succinate (SOLU-MEDROL) 125 mg/2 mL injection 125 mg     Sig:   . DISCONTD: predniSONE (DELTASONE) tablet 60 mg    Sig:   . predniSONE (DELTASONE) tablet 60 mg    Sig:   . predniSONE (DELTASONE) 50 MG tablet    Sig: 1 tab PO QD    Dispense:  3 tablet    Refill:  0  . cetirizine (ZYRTEC) 10 MG tablet    Sig: Take 1 tablet (10 mg total) by mouth daily.    Dispense:  30 tablet    Refill:  0  . albuterol (PROVENTIL) (2.5 MG/3ML) 0.083% nebulizer solution    Sig: Take 3 mL (2.5 mg total) by nebulization every 6 (six) hours as needed for wheezing.    Dispense:  75 mL    Refill:  12    Please administer 1 box of 3 ml vials for nebulizer      Graylon Good, PA-C 03/05/13 [phone]

## 2013-03-05 NOTE — ED Notes (Signed)
Reports sob since yesterday. Hx of asthma. Pt has used nebulizer and proair with no relief.  Slight cough. Denies any other symptoms.

## 2013-03-07 NOTE — ED Provider Notes (Signed)
Medical screening examination/treatment/procedure(s) were performed by a resident physician or non-physician practitioner and as the supervising physician I was immediately available for consultation/collaboration.  Clementeen Graham, MD   Rodolph Bong, MD 03/07/13 [phone]

## 2013-03-15 ENCOUNTER — Telehealth: Payer: Self-pay | Admitting: Family Medicine

## 2013-03-15 ENCOUNTER — Other Ambulatory Visit: Payer: Self-pay | Admitting: Family Medicine

## 2013-03-15 ENCOUNTER — Ambulatory Visit: Payer: 59 | Admitting: Family Medicine

## 2013-03-15 NOTE — Telephone Encounter (Signed)
Patient is out of refills on and will need new Rx's for Metoprol, Amlodipine and Ambien.

## 2013-03-15 NOTE — Telephone Encounter (Signed)
Refilled request for Amlodipine 10mg . [facility] and spoke with Pharmacist, confirming that she currently has active refills that were filled for Metoprolol and Ambien, as I did not receive these refill requests. Please have patient send any additional refill requests through pharmacy if she calls back for refills.  Thank you!

## 2013-03-15 NOTE — Telephone Encounter (Signed)
Will fwd to MD.  Kelvin Sennett L, CMA  

## 2013-04-01 ENCOUNTER — Other Ambulatory Visit: Payer: Self-pay | Admitting: Family Medicine

## 2013-04-01 ENCOUNTER — Telehealth: Payer: Self-pay | Admitting: Family Medicine

## 2013-04-01 NOTE — Telephone Encounter (Signed)
Will fwd to MD.  Kevonta Phariss L, CMA  

## 2013-04-01 NOTE — Telephone Encounter (Signed)
Reviewed chart. Called patient to discuss effectiveness of Ibuprofen 800mg  for migraine abortive therapy. Seems to be effective at stopping headaches and reducing frequency. Refilled rx for Ibuprofen 800mg  q 6 hr PRN severe migraine headache, #30, no refills. Patient understands that this is not long term therapy, and agrees to call to schedule apt to be seen in [facility] within next 2 weeks to 1 month for re-evaluation. See telephone encounter note for more details.  Saralyn Pilar, DO

## 2013-04-01 NOTE — Telephone Encounter (Signed)
Received refill request for Ibuprofen 800mg  q 6 hr PRN severe migraine HA (# 30, 0 refills), which was written for on 01/25/13 when I saw Jessica Hale at [facility]. I called her to find out if it was helping prior to authorizing refill.  Jessica Hale reports that the Ibuprofen (reported taking it 1-2x daily, although 30 pills lasted from 7/11 to 9/15) is overall helping her migraine headaches, says it "calms" them down, significantly reduces the pain, but usually does not always completely resolve or provide immediate relief of HA. She described only one refractory headache that did not go away even with rest, but otherwise she has noticed decreased frequency of headaches (previously daily now 3-4x weekly). No other concerns or questions at this time.  Discussed follow-up plans and decided to refill Ibuprofen 800mg  q 6 PRN (#30, 0 refills) until patient can return to clinic. Patient agrees to schedule follow-up with me (or someone else if can't get in soon enough) within 2 weeks to 1 month to re-evaluate her headaches. Potential plan will be to switch to Imitrex for abortive therapy to replace high dose Ibuprofen (since the headaches appear to be responding to abortive therapy). Discuss additional options for migraine prophylaxis (currently on Metoprolol 100mg  BID), previously on Nortriptyline 10mg , and considering resuming Nortripyline vs Topamax.

## 2013-04-02 ENCOUNTER — Other Ambulatory Visit: Payer: Self-pay | Admitting: Family Medicine

## 2013-04-02 MED ORDER — ZOLPIDEM TARTRATE 5 MG PO TABS: 5.0000 mg | ORAL_TABLET | Freq: Every evening | ORAL | Status: DC | PRN

## 2013-04-02 NOTE — Telephone Encounter (Signed)
Reviewed chart. Called [facility] pharmacy to phone in Rx (received faxed refill request) for Ambien 5mg  take 1 at bedtime PRN insomnia, #30 tabs, 0 refills. Will see patient in clinic prior to providing additional refills.  Saralyn Pilar, DO

## 2013-04-16 ENCOUNTER — Encounter: Payer: Self-pay | Admitting: Family Medicine

## 2013-04-16 ENCOUNTER — Ambulatory Visit (INDEPENDENT_AMBULATORY_CARE_PROVIDER_SITE_OTHER): Payer: 59 | Admitting: Family Medicine

## 2013-04-16 VITALS — BP 113/78 | HR 69 | Temp 98.5°F | Ht 62.0 in | Wt 166.0 lb

## 2013-04-16 DIAGNOSIS — J45901 Unspecified asthma with (acute) exacerbation: Secondary | ICD-10-CM

## 2013-04-16 DIAGNOSIS — J4541 Moderate persistent asthma with (acute) exacerbation: Secondary | ICD-10-CM

## 2013-04-16 DIAGNOSIS — J069 Acute upper respiratory infection, unspecified: Secondary | ICD-10-CM | POA: Insufficient documentation

## 2013-04-16 DIAGNOSIS — J4540 Moderate persistent asthma, uncomplicated: Secondary | ICD-10-CM | POA: Insufficient documentation

## 2013-04-16 MED ORDER — AZITHROMYCIN 250 MG PO TABS: ORAL_TABLET | ORAL | Status: DC

## 2013-04-16 MED ORDER — FLUTICASONE PROPIONATE HFA 44 MCG/ACT IN AERO: 1.0000 | INHALATION_SPRAY | Freq: Two times a day (BID) | RESPIRATORY_TRACT | Status: DC

## 2013-04-16 MED ORDER — PREDNISONE 20 MG PO TABS: 40.0000 mg | ORAL_TABLET | Freq: Every day | ORAL | Status: DC

## 2013-04-16 NOTE — Progress Notes (Addendum)
Subjective:     Patient ID: Jessica Hale, female   DOB: Sep 27, 1975, 37 y.o.   MRN: [id number]  HPI  1) 5 days of URI symptoms - chest congestion - runny nose and nasal congestion - some mild sinus pressure and headaches - occasionally feels clamy - does also have a sore throat - has been taking theraflu, alkaselzer plus, flonase - doing tea and honey - started feeling better and now acutely worsening - has been using her inhaler but once daily  - did need the nebulizer yesterday.   No fevers, chills, nausea, vomiting, chest pain.  + wheezing. No SOB.   2) asthma - hx of mild intermittent asthma - was sick 2 months ago and required prednisone from the ED - for the past 3 months has been having 1x weekly night time awakenings requiring her albuterol and using it at least once a day - no obvious triggers other than colds - it's new over last few months though that she is waking up at night - no hospitalizations.   Past Medical History  Diagnosis Date  . Substance abuse   . Hypertension   . Asthma    History   Social History  . Marital Status: Single    Spouse Name: N/A    Number of Children: N/A  . Years of Education: N/A   Occupational History  . Not on file.   Social History Main Topics  . Smoking status: Current Every Day Smoker -- 0.50 packs/day    Types: Cigarettes  . Smokeless tobacco: Not on file     Comment: decreased smoking/ uses e-cigarette  . Alcohol Use: No  . Drug Use: Yes    Special: Marijuana  . Sexual Activity: Yes     Comment: Lesbian   Other Topics Concern  . Not on file   Social History Narrative  . No narrative on file   Current Outpatient Prescriptions on File Prior to Visit  Medication Sig Dispense Refill  . albuterol (PROVENTIL HFA;VENTOLIN HFA) 108 (90 BASE) MCG/ACT inhaler Inhale 2 puffs into the lungs every 6 (six) hours as needed for wheezing.  1 Inhaler  1  . albuterol (PROVENTIL) (2.5 MG/3ML) 0.083% nebulizer solution Take  3 mL (2.5 mg total) by nebulization every 6 (six) hours as needed for wheezing.  75 mL  12  . amLODipine (NORVASC) 10 MG tablet TAKE ONE TABLET BY MOUTH ONCE DAILY  90 tablet  3  . cetirizine (ZYRTEC) 10 MG tablet Take 1 tablet (10 mg total) by mouth daily.  30 tablet  0  . ibuprofen (ADVIL,MOTRIN) 800 MG tablet TAKE ONE TABLET BY MOUTH EVERY 6 HOURS AS NEEDED FOR PAIN *ONLY TAKE AT THE START OF A SEVERE HEADACHE*  30 tablet  0  . metoprolol (LOPRESSOR) 100 MG tablet TAKE ONE TABLET BY MOUTH TWICE DAILY  60 tablet  2  . nortriptyline (PAMELOR) 10 MG capsule Take 10 mg by mouth at bedtime.      . ondansetron (ZOFRAN ODT) 4 MG disintegrating tablet Take 1 tablet (4 mg total) by mouth every 8 (eight) hours as needed for nausea.  20 tablet  0  . zolpidem (AMBIEN) 5 MG tablet Take 1 tablet (5 mg total) by mouth at bedtime as needed.  30 tablet  0   No current facility-administered medications on file prior to visit.   No family history on file.   Review of Systems See above     Objective:   Physical Exam  Filed Vitals:   04/16/13 1101  BP: 113/78  Pulse: 69  Temp: 98.5 F (36.9 C)  TempSrc: Oral  Height: 5\' 2"  (1.575 m)  Weight: 166 lb (75.297 kg)  SpO2: 99%   GEN: fatigued, appears to not feel well, NAD HEENT: bilateral TMs with serous middle ear effusions which per pt are chronic. No erythema. Nasal congestion bilaterally. Oropharynx clear without erythema or exudates PULM: diffuse wheezing throughout, aggravated cough with deep breathing. Prolonged expiratory phase. No rhochi or rales CV: rrr, no murmurs, rubs, gallops EXT: no edema     Assessment:     Asthma, moderate persistent, with acute exacerbation  Viral URI      Plan:     *see individual problem list

## 2013-04-16 NOTE — Assessment & Plan Note (Signed)
-   likely viral URI given time course but worsened lung function due to poorly controlled asthma - cont OTC meds for URI - rx of zpak to use only if no improvement by the weekend - prednisone burst for asthma as above - f/u if worsening resp status or no improvement.

## 2013-04-16 NOTE — Assessment & Plan Note (Signed)
Previously characterised as intermittent mild asthma but now with worsening at baseline given night time awakenings and acutely worsened currently due to URI.  - rx of prednisone 40mg  x 5 days for acute worsening.  - O2 sat normal - discussed asthma and step wise approach to the patient - will start with flovent HFA BID for long term improved control  - triggers discussed - f/u in 1 month for dedicated visit on asthma management and see if symptoms are improved.  - cont prn albuterol as well.

## 2013-04-16 NOTE — Patient Instructions (Signed)
1) try the flovent for a daily medicine for your asthma 2) take the prednisone for the next 5 days 3) if not getting better by this weekend, you will have the rx for abx.

## 2013-04-17 ENCOUNTER — Telehealth: Payer: Self-pay | Admitting: Family Medicine

## 2013-04-17 DIAGNOSIS — J4540 Moderate persistent asthma, uncomplicated: Secondary | ICD-10-CM

## 2013-04-17 NOTE — Telephone Encounter (Signed)
Will fwd to MD.  Jessica Hale, CMA  

## 2013-04-17 NOTE — Telephone Encounter (Signed)
Pt was given Flovent. Says she cannot afford that. Is there something else she can use Please advise

## 2013-04-22 ENCOUNTER — Telehealth: Payer: Self-pay | Admitting: Family Medicine

## 2013-04-22 DIAGNOSIS — I10 Essential (primary) hypertension: Secondary | ICD-10-CM

## 2013-04-22 MED ORDER — BECLOMETHASONE DIPROPIONATE 40 MCG/ACT IN AERS: 2.0000 | INHALATION_SPRAY | Freq: Two times a day (BID) | RESPIRATORY_TRACT | Status: DC

## 2013-04-22 MED ORDER — METOPROLOL TARTRATE 100 MG PO TABS: ORAL_TABLET | ORAL | Status: DC

## 2013-04-22 NOTE — Telephone Encounter (Signed)
Needs refill on metrophol [facility] [address]

## 2013-04-22 NOTE — Telephone Encounter (Signed)
Will forward to Dr Althea Charon

## 2013-04-22 NOTE — Telephone Encounter (Signed)
Patient previously seen by Dr. Reola Calkins for asthma rx Flovent. Due to cost, pt unable to fill medication. Reviewed chart, discontinued Flovent rx, and sent Rx for Qvar 2 puffs BID. Would like to evaluate pt in 1 month to see if med is effective. Please call to schedule follow-up apt in 1 month.  Jessica Hale

## 2013-05-23 ENCOUNTER — Other Ambulatory Visit: Payer: Self-pay

## 2013-05-28 ENCOUNTER — Other Ambulatory Visit: Payer: Self-pay | Admitting: Family Medicine

## 2013-05-28 MED ORDER — ZOLPIDEM TARTRATE 5 MG PO TABS: 5.0000 mg | ORAL_TABLET | Freq: Every evening | ORAL | Status: DC | PRN

## 2013-06-21 ENCOUNTER — Ambulatory Visit: Payer: 59 | Admitting: Family Medicine

## 2013-06-28 ENCOUNTER — Ambulatory Visit (INDEPENDENT_AMBULATORY_CARE_PROVIDER_SITE_OTHER): Payer: 59 | Admitting: Family Medicine

## 2013-06-28 ENCOUNTER — Encounter: Payer: Self-pay | Admitting: Family Medicine

## 2013-06-28 VITALS — BP 112/75 | HR 73 | Temp 98.2°F | Ht 62.0 in | Wt 166.0 lb

## 2013-06-28 DIAGNOSIS — G43809 Other migraine, not intractable, without status migrainosus: Secondary | ICD-10-CM

## 2013-06-28 DIAGNOSIS — G43009 Migraine without aura, not intractable, without status migrainosus: Secondary | ICD-10-CM

## 2013-06-28 DIAGNOSIS — J45909 Unspecified asthma, uncomplicated: Secondary | ICD-10-CM

## 2013-06-28 MED ORDER — BECLOMETHASONE DIPROPIONATE 40 MCG/ACT IN AERS: 2.0000 | INHALATION_SPRAY | Freq: Two times a day (BID) | RESPIRATORY_TRACT | Status: DC

## 2013-06-28 MED ORDER — NORTRIPTYLINE HCL 10 MG PO CAPS: 10.0000 mg | ORAL_CAPSULE | Freq: Every day | ORAL | Status: DC

## 2013-06-28 NOTE — Patient Instructions (Signed)
Migraine Headache A migraine headache is an intense, throbbing pain on one or both sides of your head. A migraine can last for 30 minutes to several hours. CAUSES  The exact cause of a migraine headache is not always known. However, a migraine may be caused when nerves in the brain become irritated and release chemicals that cause inflammation. This causes pain. SYMPTOMS  Pain on one or both sides of your head.  Pulsating or throbbing pain.  Severe pain that prevents daily activities.  Pain that is aggravated by any physical activity.  Nausea, vomiting, or both.  Dizziness.  Pain with exposure to bright lights, loud noises, or activity.  General sensitivity to bright lights, loud noises, or smells. Before you get a migraine, you may get warning signs that a migraine is coming (aura). An aura may include:  Seeing flashing lights.  Seeing bright spots, halos, or zig-zag lines.  Having tunnel vision or blurred vision.  Having feelings of numbness or tingling.  Having trouble talking.  Having muscle weakness. MIGRAINE TRIGGERS  Alcohol.  Smoking.  Stress.  Menstruation.  Aged cheeses.  Foods or drinks that contain nitrates, glutamate, aspartame, or tyramine.  Lack of sleep.  Chocolate.  Caffeine.  Hunger.  Physical exertion.  Fatigue.  Medicines used to treat chest pain (nitroglycerine), birth control pills, estrogen, and some blood pressure medicines. DIAGNOSIS  A migraine headache is often diagnosed based on:  Symptoms.  Physical examination.  A CT scan or MRI of your head. TREATMENT Medicines may be given for pain and nausea. Medicines can also be given to help prevent recurrent migraines.  HOME CARE INSTRUCTIONS  Only take over-the-counter or prescription medicines for pain or discomfort as directed by your caregiver. The use of long-term narcotics is not recommended.  Lie down in a dark, quiet room when you have a migraine.  Keep a journal  to find out what may trigger your migraine headaches. For example, write down:  What you eat and drink.  How much sleep you get.  Any change to your diet or medicines.  Limit alcohol consumption.  Quit smoking if you smoke.  Get 7 to 9 hours of sleep, or as recommended by your caregiver.  Limit stress.  Keep lights dim if bright lights bother you and make your migraines worse. SEEK IMMEDIATE MEDICAL CARE IF:   Your migraine becomes severe.  You have a fever.  You have a stiff neck.  You have vision loss.  You have muscular weakness or loss of muscle control.  You start losing your balance or have trouble walking.  You feel faint or pass out.  You have severe symptoms that are different from your first symptoms. MAKE SURE YOU:   Understand these instructions.  Will watch your condition.  Will get help right away if you are not doing well or get worse. Document Released: 07/04/2005 Document Revised: 09/26/2011 Document Reviewed: 06/24/2011 Exit[employer] Patient Information 2014 [employer].  

## 2013-06-28 NOTE — Progress Notes (Signed)
Subjective:     Patient ID: Jessica Hale, female   DOB: 08-02-1975, 37 y.o.   MRN: [id number]  HPI 36 yo AAF who presents to clinic today for follow-up of migraines.  1. Atypical Migraines: Patient was seen earlier this year (01/2013) for migraines and has been trying Ibuprofen as abortive therapy. However, she is still having migraines at least 3x/week. They are generally prevented from progressing to severe migraines with the use of Ibuprofen, but it doesn't always help to get rid of the migraine completely. She reports a severe migraine 1x/week on average and those are described as sharp, shooting/throbbing pains that can be frontal, or unilateral over her temple or occipital areas. With the severe migraines, she is photophobic, sensitive to sound, and has really bad nausea with occasional vomiting. She also has nausea with her mild migraines. She is not interested in continuing to take Ibuprofen as often as she is having to take it because she knows it's not good to take regularly and wants to try a different approach.  Review of Systems  Constitutional: Negative for fever, chills, activity change and appetite change.  HENT: Positive for congestion. Negative for mouth sores and sore throat.   Eyes: Positive for photophobia (Only with her severe migraines) and visual disturbance (Will occasionally get an aura where she sees "spots and stars" before the migraine presents.).  Respiratory: Negative for cough, chest tightness and shortness of breath.   Cardiovascular: Negative for chest pain and palpitations.  Gastrointestinal: Positive for nausea (Particularly with her migraines) and vomiting (Occasionally with severe migraines). Negative for abdominal pain, diarrhea and constipation.  Genitourinary: Negative for dysuria.  Musculoskeletal: Negative for arthralgias, back pain and neck stiffness.  Skin: Negative for rash.  Neurological: Positive for dizziness (Can sometimes be part of the aura  before her migraines) and headaches (Migraines 3x/wk- sharp, shooting, throbbing pain, unilaterally most often). Negative for seizures, syncope, light-headedness and numbness.  Psychiatric/Behavioral: Negative for confusion.       Objective:   Physical Exam  Constitutional: She is oriented to person, place, and time. She appears well-developed and well-nourished. No distress.  HENT:  Head: Normocephalic and atraumatic.  Eyes: EOM are normal. Pupils are equal, round, and reactive to light.  Neck: Normal range of motion. Neck supple. No tracheal deviation present.  Cardiovascular: Normal rate, regular rhythm and normal heart sounds.   No murmur heard. Pulmonary/Chest: Effort normal and breath sounds normal. No respiratory distress. She has no wheezes. She has no rales.  Abdominal: Soft. She exhibits no distension. There is no tenderness.  Musculoskeletal: Normal range of motion. She exhibits no edema.  Neurological: She is alert and oriented to person, place, and time. No cranial nerve deficit.  Skin: Skin is warm and dry. No rash noted.  Psychiatric: She has a normal mood and affect.       Assessment:     37 yo AAF with PMH HTN, Asthma, GERD, and Atypical Migraines presented to clinic today for follow-up of her migraines.    Plan:         I agree with the Medical Student's note above and edited it as necessary. I performed my own exam.   Beverely Low, MD, MPH [facility] PGY-1 06/28/2013 4:09 PM

## 2013-06-28 NOTE — Assessment & Plan Note (Addendum)
Patient has been using Ibuprofen as abortive therapy and it helps to prevent migraines from progressing from mild to severe. Patient tried Nortriptyline in the past for only 1 month and felt it was helpful as prophylactic treatment. Will treat with Nortriptyline as prophylactic and continue using Ibuprofen as prn abortive therapy.

## 2013-09-05 ENCOUNTER — Other Ambulatory Visit: Payer: Self-pay | Admitting: *Deleted

## 2013-09-05 MED ORDER — ZOLPIDEM TARTRATE 5 MG PO TABS: 5.0000 mg | ORAL_TABLET | Freq: Every evening | ORAL | Status: DC | PRN

## 2013-09-05 NOTE — Telephone Encounter (Signed)
Reviewed chart, phoned in refill for Zolpidem 5mg  take 1 at bedtime PRN insomnia (#30, 2 refills).  Saralyn PilarAlexander Emauri Krygier, DO [facility], PGY-1

## 2013-09-09 ENCOUNTER — Other Ambulatory Visit: Payer: Self-pay | Admitting: *Deleted

## 2013-09-30 ENCOUNTER — Telehealth: Payer: Self-pay | Admitting: *Deleted

## 2013-09-30 ENCOUNTER — Ambulatory Visit (INDEPENDENT_AMBULATORY_CARE_PROVIDER_SITE_OTHER): Payer: 59 | Admitting: Family Medicine

## 2013-09-30 ENCOUNTER — Encounter: Payer: Self-pay | Admitting: Family Medicine

## 2013-09-30 VITALS — BP 88/73 | HR 78 | Temp 98.5°F | Ht 62.0 in | Wt 160.0 lb

## 2013-09-30 DIAGNOSIS — J209 Acute bronchitis, unspecified: Secondary | ICD-10-CM

## 2013-09-30 DIAGNOSIS — M940 Chondrocostal junction syndrome [Tietze]: Secondary | ICD-10-CM | POA: Insufficient documentation

## 2013-09-30 MED ORDER — TRAMADOL HCL 50 MG PO TABS: 50.0000 mg | ORAL_TABLET | Freq: Four times a day (QID) | ORAL | Status: DC | PRN

## 2013-09-30 MED ORDER — MELOXICAM 15 MG PO TABS: 15.0000 mg | ORAL_TABLET | Freq: Every day | ORAL | Status: DC

## 2013-09-30 MED ORDER — AZITHROMYCIN 250 MG PO TABS: ORAL_TABLET | ORAL | Status: DC

## 2013-09-30 NOTE — Telephone Encounter (Signed)
appt with Dr.Cook today at 4 pm. .Arlyss RepressSlade, Lainey Nelson

## 2013-09-30 NOTE — Progress Notes (Signed)
   Subjective:    Patient ID: Selinda Eonatasha M Samaan, female    DOB: 30-Mar-1976, 38 y.o.   MRN: [id number]  HPI 38 year old female with hx of HTN and asthma presents with complaint of chest congestion.  1) Chest congestion - Has been present x 2 months - Recently worsening since Wed - Associated symptoms: intermittent, productive cough (green sputum) - She denies associated fever, chills.  No SOB or wheezing. Has not required increased use of albuterol. - She also notes that she has had recent chest discomfort and swelling.  Improves with ibuprofen and icy hot.  Review of Systems Per HPI    Objective:   Physical Exam Filed Vitals:   09/30/13 1604  BP: 88/73  Pulse: 78  Temp: 98.5 F (36.9 C)   Exam: General: well appearing female, NAD.  HEENT: Normal TM's bilaterally. Oropharynx clear.  Cardiovascular: RRR. No murmurs, rubs, or gallops. Chest: costosternal junctions tender to palpation. Respiratory: CTAB. No rales, rhonchi, or wheeze.    Assessment & Plan:  See Problem List

## 2013-09-30 NOTE — Assessment & Plan Note (Signed)
Will treat with Mobic and Tramadol.

## 2013-09-30 NOTE — Telephone Encounter (Signed)
Called pt and LMVM  To call back. Pt has appt with Dr.Odom today at 4 pm. He is not in office. Can be seen by another provider today (3:15pm with Dr.Thekkedam?) Waiting for call back. Lorenda Hatchet.Maggi Hershkowitz, Renato Battleshekla

## 2013-09-30 NOTE — Patient Instructions (Signed)
It was nice to see you today.  Please take the Azithromycin - 2 tablets today (500 mg); then 1 tablet 250 mg x 4 days.  Use the Tramadol and Mobic as prescribed.  Take the Mobic for the next week (you can keep the rest on hand if you need it; it is a strong anti-inflammatory that you may find helpful later)  Follow up if you fail to improve.

## 2013-09-30 NOTE — Assessment & Plan Note (Signed)
Given persistence of symptoms, will treat with Azithromycin.

## 2013-10-04 ENCOUNTER — Ambulatory Visit: Payer: 59 | Admitting: Family Medicine

## 2013-11-29 ENCOUNTER — Other Ambulatory Visit: Payer: Self-pay | Admitting: Family Medicine

## 2013-12-03 ENCOUNTER — Other Ambulatory Visit: Payer: Self-pay | Admitting: *Deleted

## 2013-12-03 DIAGNOSIS — I10 Essential (primary) hypertension: Secondary | ICD-10-CM

## 2013-12-03 MED ORDER — METOPROLOL TARTRATE 100 MG PO TABS: ORAL_TABLET | ORAL | Status: DC

## 2013-12-03 NOTE — Telephone Encounter (Signed)
Needs refill on her blood pressure medication She is completely out

## 2013-12-06 ENCOUNTER — Encounter: Payer: 59 | Admitting: Family Medicine

## 2013-12-10 ENCOUNTER — Other Ambulatory Visit: Payer: Self-pay | Admitting: *Deleted

## 2013-12-10 DIAGNOSIS — G4700 Insomnia, unspecified: Secondary | ICD-10-CM

## 2013-12-10 MED ORDER — ZOLPIDEM TARTRATE 5 MG PO TABS: 5.0000 mg | ORAL_TABLET | Freq: Every evening | ORAL | Status: DC | PRN

## 2014-01-09 ENCOUNTER — Ambulatory Visit (INDEPENDENT_AMBULATORY_CARE_PROVIDER_SITE_OTHER): Payer: 59 | Admitting: Family Medicine

## 2014-01-09 ENCOUNTER — Other Ambulatory Visit (HOSPITAL_COMMUNITY)
Admission: RE | Admit: 2014-01-09 | Discharge: 2014-01-09 | Disposition: A | Discharge status: Home / Self Care | Payer: 59 | Source: Ambulatory Visit | Attending: Family Medicine | Admitting: Family Medicine

## 2014-01-09 ENCOUNTER — Other Ambulatory Visit: Payer: Self-pay | Admitting: Family Medicine

## 2014-01-09 ENCOUNTER — Encounter: Payer: Self-pay | Admitting: Family Medicine

## 2014-01-09 VITALS — BP 124/81 | HR 67 | Temp 98.4°F | Ht 62.0 in | Wt 155.0 lb

## 2014-01-09 DIAGNOSIS — H8103 Meniere's disease, bilateral: Secondary | ICD-10-CM

## 2014-01-09 DIAGNOSIS — Z124 Encounter for screening for malignant neoplasm of cervix: Secondary | ICD-10-CM

## 2014-01-09 DIAGNOSIS — Z113 Encounter for screening for infections with a predominantly sexual mode of transmission: Secondary | ICD-10-CM | POA: Insufficient documentation

## 2014-01-09 DIAGNOSIS — I10 Essential (primary) hypertension: Secondary | ICD-10-CM

## 2014-01-09 DIAGNOSIS — H8109 Meniere's disease, unspecified ear: Secondary | ICD-10-CM

## 2014-01-09 DIAGNOSIS — Z01419 Encounter for gynecological examination (general) (routine) without abnormal findings: Secondary | ICD-10-CM | POA: Insufficient documentation

## 2014-01-09 DIAGNOSIS — G43009 Migraine without aura, not intractable, without status migrainosus: Secondary | ICD-10-CM

## 2014-01-09 DIAGNOSIS — G43809 Other migraine, not intractable, without status migrainosus: Secondary | ICD-10-CM

## 2014-01-09 DIAGNOSIS — Z1151 Encounter for screening for human papillomavirus (HPV): Secondary | ICD-10-CM | POA: Insufficient documentation

## 2014-01-09 MED ORDER — HYDROCHLOROTHIAZIDE 12.5 MG PO TABS: 12.5000 mg | ORAL_TABLET | Freq: Every day | ORAL | Status: DC

## 2014-01-09 MED ORDER — CETIRIZINE HCL 10 MG PO TABS
10.0000 mg | ORAL_TABLET | Freq: Every day | ORAL | Status: DC
Start: 2014-01-09 — End: 2014-03-06

## 2014-01-09 MED ORDER — IBUPROFEN 600 MG PO TABS: 600.0000 mg | ORAL_TABLET | Freq: Three times a day (TID) | ORAL | Status: DC | PRN

## 2014-01-09 NOTE — Patient Instructions (Signed)
Dear Jessica Hale, Thank you for coming in to clinic today. It was good to see you again!  Today we discussed your Headaches / Dizziness and Meniere's Disease. 1. I think that you are experiencing a worsening migraine flare with associated Meniere's disease 2. Recommend that you take Nortriptyline 10mg  every night (this way it will help prevent / reduce frequency of future headaches) 3. I have sent a refill on the Ibuprofen 600mg  tabs, as this may be easier to have the large dose if needed 4. For Meniere's Disease, sent new prescription for Hydrochlorothiazide (HCTZ) 12.5mg  tab, take 1 daily in the morning. It may take a few weeks, but this has been shown to reduce flares of Meniere's. We may increase this to 25mg  daily in the future if needed, however it is primarily a Blood Pressure medication, so we would have to decrease one of your other blood pressure meds  Some important numbers from today's visit: BP - 124/81, pulse 61  Additionally, we performed your Pap Smear today. Results will take a few days.  We can discuss results at your next visit.  Please schedule a follow-up appointment with me in 1 month to discuss Headaches / Dizziness.  If you have any other questions or concerns, please feel free to call the clinic to contact me. You may also schedule an earlier appointment if necessary.  However, if your symptoms get significantly worse, please go to the Emergency Department to seek immediate medical attention.  Jessica PilarAlexander Harlan Vinal, DO [facility]

## 2014-01-09 NOTE — Assessment & Plan Note (Signed)
Well-controlled HTN, on multiple agents  No complications   Plan:  1. Continue current BP meds 2. Add HCTZ 12.5mg  (trial for Meniere's disease) 3. Smoking cessation 4. RTC 2-3 months for BP re-check, yearly labs BMET

## 2014-01-09 NOTE — Assessment & Plan Note (Addendum)
Recent worsening, chronic atypical migraine headaches. - associated with dizziness, tinnitus, nausea - also previously diagnosed with Meniere's disease, otherwise may consider as vertiginous migraines  Plan: 1. Recommend taking Nortriptyline regularly for prophylaxis, use Ibuprofen PRN for abortive therapy 2. Avoid known triggers, limit caffeine 3. Consider different agents - SNRI / Effexor 4. If worsening, refer to Neurology Headache Clinic

## 2014-01-09 NOTE — Assessment & Plan Note (Signed)
Consistent with Meniere's with dizziness / vertigo, tinnitus, and episodes of hearing loss. - Usually associated with headaches, consider alternative diagnosis of vertiginous migraines, otherwise possible for both to co-exist  Plan: 1. Trial of low dose HCTZ 12.5mg  daily 2. Monitor BP, consider titration of dose 3. Future consideration for ENT referral if no improvement.

## 2014-01-09 NOTE — Assessment & Plan Note (Addendum)
-   Last pap smear 09/2010 (normal) - Pap smear performed today, will follow-up on results. Also collected GC/Chlamydia

## 2014-01-09 NOTE — Progress Notes (Signed)
Subjective:     Patient ID: Jessica Hale, female   DOB: 12-06-1975, 38 y.o.   MRN: [id number]  HPI  Meniere's Disease / HA / Dizziness - Reports worsening over past 1-2 months, with similar migraine headache about every 2-3 days, occasionally associated with nausea (no vomiting). Admits "dizzy spells", feels like "a hang-over", stomach hurts, L > R ear tinnitus and fullness. - States dizziness usually occurs first, followed by gradual onset of headache. Episodes last hours, occasionally 24 hours - headaches associated with photophobia (works all day outside) - Currently taking Nortriptyline 10mg  nightly for migraine prophylaxis, prescribed back in 06/2013, initially effective then stopped taking for up to 1-2 months, since resumed with worsening recently, but still only taking it 5 to 6 nights weekly - Takes Ibuprofen 200mg  x 2-3 tabs, with immediate improvement in HA - [facility], previously prescribed Nortriptyline for migraine headaches, diagnosed with Meniere's but never tested at that time.  CHRONIC HTN: Reports no concerns Current Meds - Amlodipine 10mg  daily, Metoprolol 100mg  BID   Reports good compliance, took meds today. Tolerating well, w/o complaints. Denies CP, SOB, lightheadedness  GYN / Cervical Cancer Screening: - Last Pap smear 09/2010 (normal results), request pap smear today  I have reviewed and updated the following as appropriate: allergies and current medications  Social Hx:  - Wife cooks healthy food, unknown if HAs related to diet - Quit smoking 1.5 weeks ago, about 6 years total < 0.5 ppd - Caffeine - 1 cup every morning (no changes recently)  Review of Systems  See above HPI    Objective:   Physical Exam  BP 124/81  Pulse 67  Temp(Src) 98.4 F (36.9 C) (Oral)  Ht 5\' 2"  (1.575 m)  Wt 155 lb (70.308 kg)  BMI 28.34 kg/m2  LMP 12/30/2013  Gen - well-appearing, pleasant, NAD HEENT - NCAT, PERRL, EOMI, patent nares w/o  congestion, no sinus tenderness, oropharynx clear, MMM Heart - RRR, no murmurs heard Lungs - CTAB. Normal effort Neuro - awake, alert, oriented, grossly non-focal, intact muscle strength 5/5 b/l, intact distal sensation to light touch, gait normal  GYN: Normal external female genitalia. Vaginal canal without lesions. Normal appearing cervix, without lesions or bleeding. Physiologic discharge on exam. Bimanual exam without masses or cervical motion tenderness.     Assessment:     See specific A&P problem list for details.      Plan:     See specific A&P problem list for details.

## 2014-01-14 LAB — CYTOLOGY - PAP

## 2014-01-16 ENCOUNTER — Telehealth: Payer: Self-pay | Admitting: Family Medicine

## 2014-01-16 NOTE — Telephone Encounter (Signed)
Called and notified patient of recent test results: GC / Chlamydia (negative) and Pap Smear (normal without evidence of malignancy), next due for Pap Smear in 3 years, 12/2016. Patient understood results, questions answered.  Saralyn PilarAlexander Karamalegos, DO [facility], PGY-2

## 2014-01-31 ENCOUNTER — Encounter: Payer: Self-pay | Admitting: Family Medicine

## 2014-01-31 ENCOUNTER — Ambulatory Visit (INDEPENDENT_AMBULATORY_CARE_PROVIDER_SITE_OTHER): Payer: 59 | Admitting: Family Medicine

## 2014-01-31 VITALS — BP 115/77 | HR 96 | Temp 98.1°F | Ht 62.0 in | Wt 159.8 lb

## 2014-01-31 DIAGNOSIS — S46911A Strain of unspecified muscle, fascia and tendon at shoulder and upper arm level, right arm, initial encounter: Secondary | ICD-10-CM

## 2014-01-31 DIAGNOSIS — H8103 Meniere's disease, bilateral: Secondary | ICD-10-CM

## 2014-01-31 DIAGNOSIS — IMO0002 Reserved for concepts with insufficient information to code with codable children: Secondary | ICD-10-CM

## 2014-01-31 DIAGNOSIS — G43009 Migraine without aura, not intractable, without status migrainosus: Secondary | ICD-10-CM

## 2014-01-31 DIAGNOSIS — H8109 Meniere's disease, unspecified ear: Secondary | ICD-10-CM

## 2014-01-31 DIAGNOSIS — G43809 Other migraine, not intractable, without status migrainosus: Secondary | ICD-10-CM

## 2014-01-31 DIAGNOSIS — S29012A Strain of muscle and tendon of back wall of thorax, initial encounter: Secondary | ICD-10-CM | POA: Insufficient documentation

## 2014-01-31 MED ORDER — CYCLOBENZAPRINE HCL 5 MG PO TABS: 5.0000 mg | ORAL_TABLET | Freq: Three times a day (TID) | ORAL | Status: DC | PRN

## 2014-01-31 NOTE — Patient Instructions (Signed)
Dear Jessica Hale, Thank you for coming in to clinic today. It was good to see you again!  Today we discussed your Shoulder / Back Pain. 1. It seems consistent with a muscle strain to me. 2. Keep taking Ibuprofen and add Tylenol (1 to 2 tabs up to 3 times daily, try alternating it with ibuprofen for more pain relief if needed, follow instructions on bottle to avoid max dosing) 3. Start Flexeril 5mg  tabs (take 1 to 2 tabs) up to 3 times a day as needed. (Start taking only at night to see how it affects you, may make you a little sleepy) 4. Continue with the massage / muscle traction techniques as demonstrated, also continue warm bath, heating pad  Please schedule a follow-up appointment with me (Dr. Althea CharonKaramalegos) in 1 month follow-up if needed. Otherwise return as previously scheduled for BP re-check / Meniere's.  If you have any other questions or concerns, please feel free to call the clinic to contact me. You may also schedule an earlier appointment if necessary.  However, if your symptoms get significantly worse, please go to the Emergency Department to seek immediate medical attention.  [name], DO [facility]

## 2014-01-31 NOTE — Progress Notes (Signed)
Patient ID: Jessica Hale, female   DOB: Jul 20, 1975, 38 y.o.   MRN: [id number] Subjective:      HPI  BACK PAIN / SHOULDER PAIN: - Reports Right-sided lower back pain and R-upper back / shoulder pain, started about 2 weeks ago gradual onset, questioned if she pulled something at work (no real change), works demanding job (shoveling etc) - Tried Ibuprofen 200mg  2-3 daily, no Tylenol - Also warm water bath soaking, epsom salts, wife providing massage (with significant improvement) - Denies any accident, injury  Meniere's Disease / HA / Dizziness - Reports improved symptoms, still episodes HA 1-2x weekly - Taking Nortriptyline regularly as instructed, HCTZ, tolerating without concerns - No new concerns  I have reviewed and updated the following as appropriate: allergies and current medications  Social Hx:  - [place] [employer], paving streets, shoveling - Quit smoking 1 month ago, about 6 years total < 0.5 ppd  Review of Systems  See above HPI    Objective:   Physical Exam  BP 115/77  Pulse 96  Temp(Src) 98.1 F (36.7 C) (Oral)  Ht 5\' 2"  (1.575 m)  Wt 159 lb 12.8 oz (72.485 kg)  BMI 29.22 kg/m2  LMP 01/28/2014  Gen - well-appearing, NAD Heart - RRR, no murmurs heard Lungs - CTAB. Normal effort MSK - R-scapular muscles +spasm / mild tender to palpation, bilateral trapezius tender, R>L lumbar paraspinal muscles +spasm / mildly tender to palpation Neuro - awake, alert, oriented, grossly non-focal, intact muscle strength 5/5 b/l, intact distal sensation to light touch, gait normal     Assessment:     See specific A&P problem list for details.      Plan:     See specific A&P problem list for details.

## 2014-02-01 NOTE — Assessment & Plan Note (Addendum)
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 Flexeril 5-10mg  TID PRN 2. Continue NSAIDs, add scheduled Tylenol 3. Demonstrated OMT Scapular Release Technique to continue at home 4. Continue conservative management, heating pad, relative rest 5. Printed noted out of work 01/31/14 through 02/03/14, return 02/04/14 6. Anticipate improvement in total 1-1.5 months, with repeated strain at work. RTC if worsening

## 2014-02-01 NOTE — Assessment & Plan Note (Signed)
Mildly improved with regular Nortriptyline use, now 1-2x HA weekly  Plan: 1. Continue daily Nortriptyline prophylaxis use 2. RTC as scheduled for consider alternative agents - SNRI / Effexor 3. Consider future referral to Neurology Headache Clinic

## 2014-02-01 NOTE — Assessment & Plan Note (Signed)
Improvement on HCTZ  Plan: 1. Continue low dose HCTZ 12.5mg  daily 2. BP controlled 3. Future consider ENT referral if no improvement

## 2014-02-06 ENCOUNTER — Other Ambulatory Visit: Payer: Self-pay | Admitting: Family Medicine

## 2014-02-25 ENCOUNTER — Encounter (HOSPITAL_COMMUNITY): Payer: Self-pay | Admitting: Emergency Medicine

## 2014-02-25 ENCOUNTER — Emergency Department (HOSPITAL_COMMUNITY)
Admission: EM | Admit: 2014-02-25 | Discharge: 2014-02-25 | Disposition: A | Discharge status: Hospice | Payer: 59 | Source: Home / Self Care | Attending: Family Medicine | Admitting: Family Medicine

## 2014-02-25 ENCOUNTER — Emergency Department (HOSPITAL_COMMUNITY): Payer: 59

## 2014-02-25 ENCOUNTER — Emergency Department (HOSPITAL_COMMUNITY)
Admission: EM | Admit: 2014-02-25 | Discharge: 2014-02-25 | Disposition: A | Discharge status: Home / Self Care | Payer: 59 | Attending: Emergency Medicine | Admitting: Emergency Medicine

## 2014-02-25 DIAGNOSIS — R51 Headache: Secondary | ICD-10-CM | POA: Diagnosis not present

## 2014-02-25 DIAGNOSIS — R079 Chest pain, unspecified: Secondary | ICD-10-CM | POA: Insufficient documentation

## 2014-02-25 DIAGNOSIS — I10 Essential (primary) hypertension: Secondary | ICD-10-CM | POA: Diagnosis not present

## 2014-02-25 DIAGNOSIS — R42 Dizziness and giddiness: Secondary | ICD-10-CM | POA: Insufficient documentation

## 2014-02-25 DIAGNOSIS — Z79899 Other long term (current) drug therapy: Secondary | ICD-10-CM | POA: Insufficient documentation

## 2014-02-25 DIAGNOSIS — J029 Acute pharyngitis, unspecified: Secondary | ICD-10-CM | POA: Diagnosis not present

## 2014-02-25 DIAGNOSIS — F17200 Nicotine dependence, unspecified, uncomplicated: Secondary | ICD-10-CM | POA: Insufficient documentation

## 2014-02-25 DIAGNOSIS — R110 Nausea: Secondary | ICD-10-CM | POA: Diagnosis not present

## 2014-02-25 DIAGNOSIS — R002 Palpitations: Secondary | ICD-10-CM | POA: Diagnosis not present

## 2014-02-25 DIAGNOSIS — J45901 Unspecified asthma with (acute) exacerbation: Secondary | ICD-10-CM | POA: Insufficient documentation

## 2014-02-25 DIAGNOSIS — R0789 Other chest pain: Secondary | ICD-10-CM

## 2014-02-25 DIAGNOSIS — R0602 Shortness of breath: Secondary | ICD-10-CM

## 2014-02-25 LAB — BASIC METABOLIC PANEL WITH GFR
Anion gap: 12 (ref 5–15)
BUN: 9 mg/dL (ref 6–23)
CO2: 24 meq/L (ref 19–32)
Calcium: 9.3 mg/dL (ref 8.4–10.5)
Chloride: 102 meq/L (ref 96–112)
Creatinine, Ser: 0.92 mg/dL (ref 0.50–1.10)
GFR calc Af Amer: >90 mL/min
GFR calc non Af Amer: 80 mL/min — ABNORMAL LOW
Glucose, Bld: 81 mg/dL (ref 70–99)
Potassium: 4 meq/L (ref 3.7–5.3)
Sodium: 138 meq/L (ref 137–147)

## 2014-02-25 LAB — CBC
HCT: 38.1 % (ref 36.0–46.0)
Hemoglobin: 13.1 g/dL (ref 12.0–15.0)
MCH: 31.5 pg (ref 26.0–34.0)
MCHC: 34.4 g/dL (ref 30.0–36.0)
MCV: 91.6 fL (ref 78.0–100.0)
Platelets: 289 10*3/uL (ref 150–400)
RBC: 4.16 MIL/uL (ref 3.87–5.11)
RDW: 13.6 % (ref 11.5–15.5)
WBC: 7.2 10*3/uL (ref 4.0–10.5)

## 2014-02-25 LAB — TROPONIN I: Troponin I: <0.3 ng/mL

## 2014-02-25 NOTE — ED Provider Notes (Signed)
CSN: [id number]     Arrival date & time 02/25/14  1133 History   First MD Initiated Contact with Patient 02/25/14 1137     Chief Complaint  Patient presents with  . Chest Pain     (Consider location/radiation/quality/duration/timing/severity/associated sxs/prior Treatment) HPI Pt is a 38yo female with hx of substance abuse, HTN and asthma sent to ED from urgent care for further evaluation and treatment of intermittent chest pain and SOB on exertion x4 days.  Pt states she has aching, 1/10 centralized chest pain that occasionally radiates to right side of her chest, pain gradually worsened over last 4 days.  Pt also c/o intermittent mild, gradual onset headache, "slight" sore throat and "slight" cough since symptoms onset. Pt states she has used her breathing machine at home twice due to the SOB but states it does not feel like her typical asthma exacerbation. States she did stop smoking cigarettes 3 weeks ago but did have one earlier today.  Denies known sick contacts or recent travel. Denies hx of CAD or FH of CAD.  Denies leg pain, long travel, or use of estrogen or birth control.   Past Medical History  Diagnosis Date  . Substance abuse   . Hypertension   . Asthma    Past Surgical History  Procedure Laterality Date  . Knee surgery     History reviewed. No pertinent family history. History  Substance Use Topics  . Smoking status: Current Every Day Smoker -- 0.50 packs/day    Types: Cigarettes  . Smokeless tobacco: Not on file     Comment: decreased smoking/ uses e-cigarette  . Alcohol Use: No   OB History   Grav Para Term Preterm Abortions TAB SAB Ect Mult Living                 Review of Systems  Constitutional: Negative for fever, chills and diaphoresis.  Respiratory: Positive for cough and shortness of breath ( slight, with exertion ).   Cardiovascular: Positive for chest pain and palpitations. Negative for leg swelling.  Gastrointestinal: Positive for nausea. Negative  for vomiting, abdominal pain, diarrhea and constipation.  Neurological: Positive for dizziness and headaches. Negative for syncope, weakness and light-headedness.  All other systems reviewed and are negative.     Allergies  Review of patient's allergies indicates no known allergies.  Home Medications   Prior to Admission medications   Medication Sig Start Date End Date Taking? Authorizing Provider  albuterol (PROVENTIL HFA;VENTOLIN HFA) 108 (90 BASE) MCG/ACT inhaler Inhale 2 puffs into the lungs every 6 (six) hours as needed for wheezing. 05/17/12  Yes Everrett Coombe, DO  albuterol (PROVENTIL) (2.5 MG/3ML) 0.083% nebulizer solution Take 3 mL (2.5 mg total) by nebulization every 6 (six) hours as needed for wheezing. 03/05/13  Yes Adrian Blackwater Baker, PA-C  amLODipine (NORVASC) 10 MG tablet Take 10 mg by mouth daily.   Yes Historical Provider, MD  cetirizine (ZYRTEC) 10 MG tablet Take 1 tablet (10 mg total) by mouth daily. 01/09/14  Yes Alexander Althea Charon, DO  hydrochlorothiazide (HYDRODIURIL) 12.5 MG tablet Take 1 tablet (12.5 mg total) by mouth daily. 01/09/14  Yes Alexander Althea Charon, DO  ibuprofen (ADVIL,MOTRIN) 600 MG tablet Take 1 tablet (600 mg total) by mouth every 8 (eight) hours as needed for headache. 01/09/14  Yes Saralyn Pilar, DO  metoprolol (LOPRESSOR) 100 MG tablet Take 100 mg by mouth 2 (two) times daily.   Yes Historical Provider, MD  nortriptyline (PAMELOR) 10 MG capsule Take 1 capsule (10  mg total) by mouth at bedtime. 06/28/13  Yes Abram SanderElena M Adamo, MD  zolpidem (AMBIEN) 5 MG tablet Take 5 mg by mouth at bedtime as needed for sleep.   Yes Historical Provider, MD   BP 129/91  Pulse 80  Temp(Src) 98.7 F (37.1 C) (Oral)  Resp 19  SpO2 100%  LMP 01/28/2014 Physical Exam  Nursing note and vitals reviewed. Constitutional: She appears well-developed and well-nourished. No distress.  Pt lying comfortably in exam bed, NAD.   HENT:  Head: Normocephalic and atraumatic.   Eyes: Conjunctivae are normal. No scleral icterus.  Neck: Normal range of motion.  Cardiovascular: Normal rate, regular rhythm and normal heart sounds.   Regular rate and rhythm  Pulmonary/Chest: Effort normal and breath sounds normal. No respiratory distress. She has no wheezes. She has no rales. She exhibits no tenderness.  No respiratory distress, able to speak in full sentences w/o difficulty. Lungs: CTAB Mild centralized chest Okonski tenderness.  Abdominal: Soft. Bowel sounds are normal. She exhibits no distension and no mass. There is no tenderness. There is no rebound and no guarding.  Musculoskeletal: Normal range of motion.  Neurological: She is alert.  Skin: Skin is warm and dry. She is not diaphoretic.    ED Course  Procedures (including critical care time) Labs Review Labs Reviewed  BASIC METABOLIC PANEL - Abnormal; Notable for the following:    GFR calc non Af Amer 80 (*)    All other components within normal limits  CBC  TROPONIN I    Imaging Review Dg Chest 2 View  02/25/2014   CLINICAL DATA:  Mid chest pain  EXAM: CHEST  2 VIEW  COMPARISON:  03/05/2013  FINDINGS: The heart size and mediastinal contours are within normal limits. Both lungs are clear. The visualized skeletal structures are unremarkable.  IMPRESSION: No active cardiopulmonary disease.   Electronically Signed   By: Elige KoHetal  Patel   On: 02/25/2014 12:50     EKG Interpretation   Date/Time:  Tuesday February 25 2014 11:45:38 EDT Ventricular Rate:  80 PR Interval:  155 QRS Duration: 64 QT Interval:  370 QTC Calculation: 427 R Axis:   50 Text Interpretation:  Sinus rhythm Borderline T abnormalities, anterior  leads Similar to prior Confirmed by Gwendolyn GrantWALDEN  MD, BLAIR ([id number]) on 02/25/2014  11:48:41 AM      MDM   Final diagnoses:  Chest pain, unspecified chest pain type   Pt is presenting to ED from Urgent care for further evaluation of chest pain and further evaluation of "very slight T wave flattening as  compared to previous"  EKG performed in ED, interpreted by Dr. Gwendolyn GrantWalden, no change from previous.  Pt is PERC negative.  Will get CBC, BMP, Troponin and CXR.  Pt appears well, no acute distress. No respiratory distress. Lungs: CTAB. Mild centralized chest Henes tenderness.    Negative cardiac workup. Discussed pt with Dr. Gwendolyn GrantWalden who also examined pt.  Chest pain likely due to costochondritis due to reproducible chest pain.  Pt is concerned it is due to new BP medication HCTZ or allergy medicine, cetirizine. Advised pt to discuss medication concerns with her PCP as BP is important to keep well controlled. Return precautions provided. Pt verbalized understanding and agreement with tx plan.    Junius Finnerrin O'Malley, PA-C 02/25/14 1334

## 2014-02-25 NOTE — ED Provider Notes (Signed)
Medical screening examination/treatment/procedure(s) were performed by resident physician or non-physician practitioner and as supervising physician I was immediately available for consultation/collaboration.   Barkley BrunsKINDL,Rayleen Wyrick DOUGLAS MD.   Linna HoffJames D Tika Hannis, MD 02/25/14 [phone]

## 2014-02-25 NOTE — ED Provider Notes (Signed)
CSN: [id number]     Arrival date & time 02/25/14  1045 History   None    Chief Complaint  Patient presents with  . Chest Pain   (Consider location/radiation/quality/duration/timing/severity/associated sxs/prior Treatment) HPI Comments: 38 year old female presents for evaluation of chest pain with associated shortness of breath, dizziness, and palpitations. This pain initially began about 3-4 days ago and felt more like fluttering with mild shortness of breath and dizziness. Since then she has had worsening pain. The pain is worse with exertion but incompletely relieved by rest. She has been using her albuterol inhaler when she has the shortness of breath but it has not been helping. She describes the chest pain as being substernal and somewhat radiating to the right. She denies nausea or vomiting. Right now she is experiencing pain, very mild, 1-2/10 in severity.  Patient is a 38 y.o. female presenting with chest pain.  Chest Pain Associated symptoms: dizziness, palpitations and shortness of breath     Past Medical History  Diagnosis Date  . Substance abuse   . Hypertension   . Asthma    Past Surgical History  Procedure Laterality Date  . Knee surgery     History reviewed. No pertinent family history. History  Substance Use Topics  . Smoking status: Current Every Day Smoker -- 0.50 packs/day    Types: Cigarettes  . Smokeless tobacco: Not on file     Comment: decreased smoking/ uses e-cigarette  . Alcohol Use: No   OB History   Grav Para Term Preterm Abortions TAB SAB Ect Mult Living                 Review of Systems  Respiratory: Positive for shortness of breath.   Cardiovascular: Positive for chest pain and palpitations.  Neurological: Positive for dizziness.  All other systems reviewed and are negative.   Allergies  Review of patient's allergies indicates no known allergies.  Home Medications   Prior to Admission medications   Medication Sig Start Date End Date  Taking? Authorizing Provider  albuterol (PROVENTIL HFA;VENTOLIN HFA) 108 (90 BASE) MCG/ACT inhaler Inhale 2 puffs into the lungs every 6 (six) hours as needed for wheezing. 05/17/12   Everrett Coombeody Matthews, DO  albuterol (PROVENTIL) (2.5 MG/3ML) 0.083% nebulizer solution Take 3 mL (2.5 mg total) by nebulization every 6 (six) hours as needed for wheezing. 03/05/13   Adrian BlackwaterZachary H Daxson Reffett, PA-C  amLODipine (NORVASC) 10 MG tablet TAKE ONE TABLET BY MOUTH ONCE DAILY 02/06/14   Saralyn PilarAlexander Karamalegos, DO  azithromycin (ZITHROMAX Z-PAK) 250 MG tablet Take as directed 09/30/13   Tommie SamsJayce G Cook, DO  beclomethasone (QVAR) 40 MCG/ACT inhaler Inhale 2 puffs into the lungs 2 (two) times daily. 06/28/13   Abram SanderElena M Adamo, MD  cetirizine (ZYRTEC) 10 MG tablet Take 1 tablet (10 mg total) by mouth daily. 01/09/14   Saralyn PilarAlexander Karamalegos, DO  cyclobenzaprine (FLEXERIL) 5 MG tablet Take 1-2 tablets (5-10 mg total) by mouth 3 (three) times daily as needed for muscle spasms. 01/31/14   Saralyn PilarAlexander Karamalegos, DO  hydrochlorothiazide (HYDRODIURIL) 12.5 MG tablet Take 1 tablet (12.5 mg total) by mouth daily. 01/09/14   Saralyn PilarAlexander Karamalegos, DO  ibuprofen (ADVIL,MOTRIN) 600 MG tablet Take 1 tablet (600 mg total) by mouth every 8 (eight) hours as needed for headache. 01/09/14   Saralyn PilarAlexander Karamalegos, DO  metoprolol (LOPRESSOR) 100 MG tablet TAKE ONE TABLET BY MOUTH TWICE DAILY 12/03/13   Saralyn PilarAlexander Karamalegos, DO  nortriptyline (PAMELOR) 10 MG capsule Take 1 capsule (10 mg total) by  mouth at bedtime. 06/28/13   Abram Sander, MD  zolpidem (AMBIEN) 5 MG tablet Take 1 tablet (5 mg total) by mouth at bedtime as needed. 12/10/13   Saralyn Pilar, DO   BP 123/80  Pulse 85  Temp(Src) 98.7 F (37.1 C) (Oral)  Resp 18  SpO2 100%  LMP 01/28/2014 Physical Exam  Nursing note and vitals reviewed. Constitutional: She is oriented to person, place, and time. Vital signs are normal. She appears well-developed and well-nourished. No distress.  HENT:   Head: Normocephalic and atraumatic.  Neck: Normal range of motion. Neck supple. No JVD present. No tracheal deviation present.  Cardiovascular: Normal rate, regular rhythm and normal heart sounds.   Pulmonary/Chest: Effort normal and breath sounds normal. No respiratory distress.  Neurological: She is alert and oriented to person, place, and time. She has normal strength. Coordination normal.  Skin: Skin is warm and dry. No rash noted. She is not diaphoretic.  Psychiatric: She has a normal mood and affect. Judgment normal.    ED Course  ED EKG  Date/Time: 02/25/2014 11:26 AM Performed by: Autumn Messing, H Authorized by: Bradd Canary D Comparison: compared with previous ECG  Comparison to previous ECG: Very slight T wave flattening as compared to previous Rhythm: sinus rhythm Rate: normal QRS axis: normal Conduction: conduction normal ST Segments: ST segments normal T wave flattening noted on lead: Flattening across the precordium. Other: no other findings Clinical impression: abnormal ECG   (including critical care time) Labs Review Labs Reviewed - No data to display  Imaging Review No results found.   MDM   1. Other chest pain   2. SOB (shortness of breath)    46f with CP, exertional, incompletely relieved by rest, with SOB, dizziness, and palpitations, slight EKG change, transferred to ED via shuttle for evaluation of chest pain        Graylon Good, PA-C 02/25/14 1128

## 2014-02-25 NOTE — ED Notes (Signed)
C/o mid chest pain which started 3-4 days ago States pain was causing sob Ibuprofen and asthma inhaler was used as tx

## 2014-02-25 NOTE — Discharge Instructions (Signed)

## 2014-02-25 NOTE — ED Notes (Signed)
Pt to ED from urgent care for abnormal EKG.   Onset 4 days chest pain - mid chest pain and right sided chest pain,  shortness of breath with exertion, nausea, cough.  Used inhaler several times with some relief

## 2014-02-26 NOTE — ED Provider Notes (Signed)
Medical screening examination/treatment/procedure(s) were conducted as a shared visit with non-physician practitioner(s) and myself.  I personally evaluated the patient during the encounter.   EKG Interpretation   Date/Time:  Tuesday February 25 2014 11:45:38 EDT Ventricular Rate:  80 PR Interval:  155 QRS Duration: 64 QT Interval:  370 QTC Calculation: 427 R Axis:   50 Text Interpretation:  Sinus rhythm Borderline T abnormalities, anterior  leads Similar to prior Confirmed by Gwendolyn GrantWALDEN  MD, Sangeeta Youse ([id number]) on 02/25/2014  11:48:41 AM      Patient here with CP - reproducible on exam. Low risk. Labs and CXR normal. Stable for discharge.  Elwin MochaBlair Jahaira Earnhart, MD 02/26/14 [phone]

## 2014-03-06 ENCOUNTER — Encounter (HOSPITAL_COMMUNITY): Payer: Self-pay | Admitting: Emergency Medicine

## 2014-03-06 ENCOUNTER — Observation Stay (HOSPITAL_COMMUNITY)
Admission: EM | Admit: 2014-03-06 | Discharge: 2014-03-07 | Disposition: A | Discharge status: Home / Self Care | Payer: 59 | Attending: Family Medicine | Admitting: Family Medicine

## 2014-03-06 ENCOUNTER — Emergency Department (HOSPITAL_COMMUNITY)
Admission: EM | Admit: 2014-03-06 | Discharge: 2014-03-06 | Disposition: A | Discharge status: Other Acute Inpatient Hospital | Payer: 59 | Source: Home / Self Care | Attending: Family Medicine | Admitting: Family Medicine

## 2014-03-06 ENCOUNTER — Observation Stay (HOSPITAL_COMMUNITY): Payer: 59

## 2014-03-06 DIAGNOSIS — F17200 Nicotine dependence, unspecified, uncomplicated: Secondary | ICD-10-CM | POA: Diagnosis not present

## 2014-03-06 DIAGNOSIS — R079 Chest pain, unspecified: Secondary | ICD-10-CM | POA: Diagnosis not present

## 2014-03-06 DIAGNOSIS — R071 Chest pain on breathing: Secondary | ICD-10-CM | POA: Insufficient documentation

## 2014-03-06 DIAGNOSIS — M7918 Myalgia, other site: Secondary | ICD-10-CM

## 2014-03-06 DIAGNOSIS — K219 Gastro-esophageal reflux disease without esophagitis: Secondary | ICD-10-CM | POA: Insufficient documentation

## 2014-03-06 DIAGNOSIS — Z79899 Other long term (current) drug therapy: Secondary | ICD-10-CM | POA: Diagnosis not present

## 2014-03-06 DIAGNOSIS — G4700 Insomnia, unspecified: Secondary | ICD-10-CM | POA: Insufficient documentation

## 2014-03-06 DIAGNOSIS — J309 Allergic rhinitis, unspecified: Secondary | ICD-10-CM | POA: Diagnosis not present

## 2014-03-06 DIAGNOSIS — R51 Headache: Secondary | ICD-10-CM | POA: Diagnosis not present

## 2014-03-06 DIAGNOSIS — G8929 Other chronic pain: Secondary | ICD-10-CM | POA: Insufficient documentation

## 2014-03-06 DIAGNOSIS — R002 Palpitations: Secondary | ICD-10-CM | POA: Diagnosis present

## 2014-03-06 DIAGNOSIS — M940 Chondrocostal junction syndrome [Tietze]: Secondary | ICD-10-CM

## 2014-03-06 DIAGNOSIS — H8109 Meniere's disease, unspecified ear: Secondary | ICD-10-CM | POA: Insufficient documentation

## 2014-03-06 DIAGNOSIS — Z716 Tobacco abuse counseling: Secondary | ICD-10-CM

## 2014-03-06 DIAGNOSIS — J45909 Unspecified asthma, uncomplicated: Secondary | ICD-10-CM | POA: Diagnosis not present

## 2014-03-06 DIAGNOSIS — Z7189 Other specified counseling: Secondary | ICD-10-CM

## 2014-03-06 DIAGNOSIS — R0789 Other chest pain: Secondary | ICD-10-CM

## 2014-03-06 DIAGNOSIS — I10 Essential (primary) hypertension: Secondary | ICD-10-CM | POA: Diagnosis not present

## 2014-03-06 DIAGNOSIS — IMO0001 Reserved for inherently not codable concepts without codable children: Secondary | ICD-10-CM

## 2014-03-06 HISTORY — DX: Migraine, unspecified, not intractable, without status migrainosus: G43.909

## 2014-03-06 LAB — RAPID URINE DRUG SCREEN, HOSP PERFORMED
Amphetamines: NOT DETECTED
BARBITURATES: NOT DETECTED
Benzodiazepines: NOT DETECTED
COCAINE: NOT DETECTED
Opiates: NOT DETECTED
Tetrahydrocannabinol: NOT DETECTED

## 2014-03-06 LAB — CBC WITH DIFFERENTIAL/PLATELET
BASOS PCT: 1 % (ref 0–1)
Basophils Absolute: 0 10*3/uL (ref 0.0–0.1)
EOS ABS: 0.5 10*3/uL (ref 0.0–0.7)
EOS PCT: 8 % — AB (ref 0–5)
HCT: 35.6 % — ABNORMAL LOW (ref 36.0–46.0)
Hemoglobin: 12.5 g/dL (ref 12.0–15.0)
Lymphocytes Relative: 36 % (ref 12–46)
Lymphs Abs: 2.4 10*3/uL (ref 0.7–4.0)
MCH: 31.9 pg (ref 26.0–34.0)
MCHC: 35.1 g/dL (ref 30.0–36.0)
MCV: 90.8 fL (ref 78.0–100.0)
MONOS PCT: 6 % (ref 3–12)
Monocytes Absolute: 0.4 10*3/uL (ref 0.1–1.0)
Neutro Abs: 3.4 10*3/uL (ref 1.7–7.7)
Neutrophils Relative %: 49 % (ref 43–77)
PLATELETS: 319 10*3/uL (ref 150–400)
RBC: 3.92 MIL/uL (ref 3.87–5.11)
RDW: 13.8 % (ref 11.5–15.5)
WBC: 6.8 10*3/uL (ref 4.0–10.5)

## 2014-03-06 LAB — TROPONIN I: Troponin I: <0.3 ng/mL (ref <0.30)

## 2014-03-06 LAB — COMPREHENSIVE METABOLIC PANEL
ALK PHOS: 53 U/L (ref 39–117)
ALT: 28 U/L (ref 0–35)
AST: 24 U/L (ref 0–37)
Albumin: 3.8 g/dL (ref 3.5–5.2)
Anion gap: 12 (ref 5–15)
BUN: 10 mg/dL (ref 6–23)
CHLORIDE: 105 meq/L (ref 96–112)
CO2: 21 meq/L (ref 19–32)
CREATININE: 0.81 mg/dL (ref 0.50–1.10)
Calcium: 9 mg/dL (ref 8.4–10.5)
GFR calc Af Amer: >90 mL/min (ref >90)
Glucose, Bld: 132 mg/dL — ABNORMAL HIGH (ref 70–99)
Potassium: 3.6 mEq/L — ABNORMAL LOW (ref 3.7–5.3)
Sodium: 138 mEq/L (ref 137–147)
Total Protein: 6.7 g/dL (ref 6.0–8.3)

## 2014-03-06 LAB — I-STAT CHEM 8, ED
BUN: 11 mg/dL (ref 6–23)
CALCIUM ION: 1.2 mmol/L (ref 1.12–1.23)
CREATININE: 0.9 mg/dL (ref 0.50–1.10)
Chloride: 108 mEq/L (ref 96–112)
GLUCOSE: 86 mg/dL (ref 70–99)
HCT: 39 % (ref 36.0–46.0)
Hemoglobin: 13.3 g/dL (ref 12.0–15.0)
Potassium: 3.9 mEq/L (ref 3.7–5.3)
Sodium: 139 mEq/L (ref 137–147)
TCO2: 21 mmol/L (ref 0–100)

## 2014-03-06 LAB — I-STAT TROPONIN, ED: Troponin i, poc: 0 ng/mL (ref 0.00–0.08)

## 2014-03-06 LAB — HEMOGLOBIN A1C
Hgb A1c MFr Bld: 5.4 % (ref <5.7)
Mean Plasma Glucose: 108 mg/dL (ref <117)

## 2014-03-06 LAB — TSH: TSH: 1.74 u[IU]/mL (ref 0.350–4.500)

## 2014-03-06 MED ORDER — SODIUM CHLORIDE 0.9 % IJ SOLN
3.0000 mL | INTRAMUSCULAR | Status: DC | PRN
Start: 2014-03-06 — End: 2014-03-07

## 2014-03-06 MED ORDER — NORTRIPTYLINE HCL 10 MG PO CAPS
10.0000 mg | ORAL_CAPSULE | Freq: Every day | ORAL | Status: DC
  Administered 2014-03-06: 10 mg via ORAL
  Filled 2014-03-06 (×3): qty 1

## 2014-03-06 MED ORDER — IBUPROFEN 600 MG PO TABS
600.0000 mg | ORAL_TABLET | Freq: Three times a day (TID) | ORAL | Status: DC | PRN
  Filled 2014-03-06: qty 1

## 2014-03-06 MED ORDER — ACETAMINOPHEN 650 MG RE SUPP: 650.0000 mg | Freq: Four times a day (QID) | RECTAL | Status: DC | PRN

## 2014-03-06 MED ORDER — AMLODIPINE BESYLATE 10 MG PO TABS
10.0000 mg | ORAL_TABLET | Freq: Every day | ORAL | Status: DC
  Administered 2014-03-07: 10 mg via ORAL
  Filled 2014-03-06: qty 1

## 2014-03-06 MED ORDER — SODIUM CHLORIDE 0.9 % IJ SOLN
3.0000 mL | Freq: Two times a day (BID) | INTRAMUSCULAR | Status: DC
  Administered 2014-03-06 (×2): 3 mL via INTRAVENOUS

## 2014-03-06 MED ORDER — HEPARIN SODIUM (PORCINE) 5000 UNIT/ML IJ SOLN
5000.0000 [IU] | Freq: Three times a day (TID) | INTRAMUSCULAR | Status: DC
  Administered 2014-03-06 – 2014-03-07 (×3): 5000 [IU] via SUBCUTANEOUS
  Filled 2014-03-06 (×6): qty 1

## 2014-03-06 MED ORDER — SODIUM CHLORIDE 0.9 % IV SOLN: INTRAVENOUS | Status: DC

## 2014-03-06 MED ORDER — METOPROLOL TARTRATE 100 MG PO TABS
100.0000 mg | ORAL_TABLET | Freq: Two times a day (BID) | ORAL | Status: DC
  Administered 2014-03-06 – 2014-03-07 (×2): 100 mg via ORAL
  Filled 2014-03-06 (×4): qty 1

## 2014-03-06 MED ORDER — SODIUM CHLORIDE 0.9 % IV SOLN: 250.0000 mL | INTRAVENOUS | Status: DC | PRN

## 2014-03-06 MED ORDER — POTASSIUM CHLORIDE CRYS ER 20 MEQ PO TBCR
20.0000 meq | EXTENDED_RELEASE_TABLET | Freq: Two times a day (BID) | ORAL | Status: DC
  Administered 2014-03-06 – 2014-03-07 (×2): 20 meq via ORAL
  Filled 2014-03-06 (×3): qty 1

## 2014-03-06 MED ORDER — ALBUTEROL SULFATE (2.5 MG/3ML) 0.083% IN NEBU: 3.0000 mL | INHALATION_SOLUTION | Freq: Four times a day (QID) | RESPIRATORY_TRACT | Status: DC | PRN

## 2014-03-06 MED ORDER — ACETAMINOPHEN 325 MG PO TABS: 650.0000 mg | ORAL_TABLET | Freq: Four times a day (QID) | ORAL | Status: DC | PRN

## 2014-03-06 MED ORDER — SODIUM CHLORIDE 0.9 % IJ SOLN: 3.0000 mL | Freq: Two times a day (BID) | INTRAMUSCULAR | Status: DC

## 2014-03-06 NOTE — Progress Notes (Signed)
FPTS PGY-3 Interim Note  S: Pt without complaints at this time. Describes a very brief period around 2:30 PM of similar "not as bad" palpitations, but no frank chest pain, SOB, headache, N/V, abdominal pain, or LE swelling.  O: BP 109/83  Pulse 70  Temp(Src) 97.8 F (36.6 C) (Oral)  Resp 16  SpO2 100%  LMP 02/28/2014 General: adult female in NAD  Cardio: RRR, no murmur, radial pulses equal / intact Pulm: CTAB Extremities: no LE edema Neuro: alert / oriented, grossly normal  A/P: 38yo female with palpitations of uncertain etiology; ?SVT given improvement with vagal maneuvers - continue troponin cycling, tele monitoring - monitor BP's - consider cardiology consult inpt vs outpt referral for consideration of Holter or loop monitoring - otherwise continue per H&P  Bobbye Mortonhristopher M Tish Begin, MD PGY-3, [facility] 03/06/2014, 4:12 PM FPTS Service pager: [phone] (text pages welcome through [facility])

## 2014-03-06 NOTE — ED Notes (Signed)
C/o 2 week duration of CP, SOB, palpitations, heart beating out of chest. NAD at present

## 2014-03-06 NOTE — H&P (Signed)
Family Medicine Teaching [facility] Admission History and Physical Service Pager: [phone]  Patient name: Jessica Hale Medical record number: [id number] Date of birth: [birth year] Age: 38 y.o. Gender: female  Primary Care Provider: Saralyn Pilar, DO Consultants: none Code Status: full (discussed with pt upon admission)  Chief Complaint: chest pain  Assessment and Plan: Jessica Hale is a 38 y.o. female presenting with chest pain . PMH is significant for HTN, GERD, asthma, meniere's disease.  # Chest pain and palpitations: pain most likely musculoskeletal given reproducibility with palpation. Palpitations are concerning for possible paroxysmal tachyarrhythmia (ie, SVT), especially as they are improved with vagal maneuvers (splashing cold water on face). EKG without any signs of ischemia at present. - admit to hospital for observation - monitor on telemetry - cycle troponins x 3 - repeat EKG in AM - ibuprofen prn pain - risk stratification: TSH, A1c, AM lipid panel  # HTN: currently normotensive. Recently started HCTZ but did not like how she felt (thought palpitations could possibly be secondary to this medicine) so she stopped it. - continue home amlodipine and metoprolol  # Hx asthma: no current exacerbation - albuterol 2 puff q6h prn wheezing/shortness of breath  # Headache prophylaxis: on nortriptyline at home QHS - continue this med while here  FEN/GI: heart healthy diet, SLIV Prophylaxis: SQ heparin  Disposition: admit to telemetry, dispo pending cardiac rule out & cardiac monitoring  History of Present Illness: Jessica Hale is a 38 y.o. female presenting with palpitations.  Pt states that for the last couple of weeks, she has had intermittent heart palpitations. She has not identified a pattern to whether they occur primarily with exertion, or at rest. She noticed them more at night. It feels like a pounding in her chest that improves when she splashes  cold water on her face. She has also had a separate sensation of chest discomfort, which is primarily right sided, radiating up to her neck and in her shoulder. Chest Hehr is tender to palpation when this chest discomfort occurs. Was seen at urgent care 1 week ago for this, and sent to ER, diagnosed with musculoskeletal chest pain and discharged home. Presented again today to urgent care with again complaints of palpitations, sent here to the ER again. No hx of this happening before in the past. She drinks one cup of coffee per day, otherwise has no additional caffeine.  No family hx of CAD in first degree relative. No personal hx of CAD or other heart problems. She has been a smoker but quit a few weeks ago. Palpitations preceded her quitting smoking.  Review Of Systems: Per HPI with the following additions: denies abdominal pain, urinary problems, stooling problems, decreased PO intake. Otherwise 12 point review of systems was performed and was unremarkable.  Patient Active Problem List   Diagnosis Date Noted  . Palpitations 03/06/2014  . Muscle strain of right upper back 01/31/2014  . Screening for cervical cancer 01/09/2014  . Acute bronchitis 09/30/2013  . Costochondritis 09/30/2013  . Asthma, moderate persistent 04/16/2013  . Meniere's disease (cochlear hydrops) 01/25/2013  . Allergic rhinitis 04/10/2012  . Auditory tube disorder 07/27/2011  . Atypical migraine 05/02/2011  . Tobacco abuse counseling 03/09/2010  . TB SKIN TEST, POSITIVE 12/06/2007  . INSOMNIA, PERSISTENT 10/16/2007  . GERD 10/16/2007  . HYPERTENSION, BENIGN 02/07/2007  . ASTHMA, INTERMITTENT 02/07/2007   Past Medical History: Past Medical History  Diagnosis Date  . Substance abuse   . Hypertension   . Asthma   .  Migraine headache    Past Surgical History: Past Surgical History  Procedure Laterality Date  . Knee surgery     Social History: History  Substance Use Topics  . Smoking status: Current Every Day  Smoker -- 0.50 packs/day    Types: Cigarettes  . Smokeless tobacco: Not on file     Comment: decreased smoking/ uses e-cigarette  . Alcohol Use: Yes     Comment: occasional   Additional social history: quit smoking a few weeks ago. No drugs. Occasional alcohol.  Please also refer to relevant sections of EMR.  Family History: No family history on file. Allergies and Medications: No Known Allergies No current facility-administered medications on file prior to encounter.   Current Outpatient Prescriptions on File Prior to Encounter  Medication Sig Dispense Refill  . albuterol (PROVENTIL HFA;VENTOLIN HFA) 108 (90 BASE) MCG/ACT inhaler Inhale 2 puffs into the lungs every 6 (six) hours as needed for wheezing.  1 Inhaler  1  . albuterol (PROVENTIL) (2.5 MG/3ML) 0.083% nebulizer solution Take 3 mL (2.5 mg total) by nebulization every 6 (six) hours as needed for wheezing.  75 mL  12  . amLODipine (NORVASC) 10 MG tablet Take 10 mg by mouth daily.      . cetirizine (ZYRTEC) 10 MG tablet Take 1 tablet (10 mg total) by mouth daily.  30 tablet  5  . hydrochlorothiazide (HYDRODIURIL) 12.5 MG tablet Take 1 tablet (12.5 mg total) by mouth daily.  90 tablet  3  . ibuprofen (ADVIL,MOTRIN) 600 MG tablet Take 1 tablet (600 mg total) by mouth every 8 (eight) hours as needed for headache.  30 tablet  1  . metoprolol (LOPRESSOR) 100 MG tablet Take 100 mg by mouth 2 (two) times daily.      . nortriptyline (PAMELOR) 10 MG capsule Take 1 capsule (10 mg total) by mouth at bedtime.  30 capsule  11  . zolpidem (AMBIEN) 5 MG tablet Take 5 mg by mouth at bedtime as needed for sleep.        Objective: BP 123/81  Pulse 73  Temp(Src) 98 F (36.7 C) (Oral)  Resp 12  SpO2 100%  LMP 02/28/2014 Exam: General: NAD HEENT: NCAT, MMM, PERRL, no palpable thyromegaly Cardiovascular: RRR, no murmurs. Chest [place] tender to palpation. Respiratory: CTAB, NWOB Abdomen: +BS. Soft, nontender to palpation Extremities: No  appreciable lower extremity edema bilaterally, calves NTTP Skin: no rashes noted Neuro: grossly nonfocal, speech normal  Labs and Imaging: CBC BMET   Recent Labs Lab 03/06/14 1127  HGB 13.3  HCT 39.0    Recent Labs Lab 03/06/14 1127  NA 139  K 3.9  CL 108  BUN 11  CREATININE 0.90  GLUCOSE 86     EKG NSR, no signs of acute ischemia  CXR  Heart size and mediastinal contours are within normal limits. Both lungs are clear. Visualized skeletal structures are unremarkable.  IMPRESSION:  Negative exam.   Latrelle DodrillBrittany J Anira Senegal, MD 03/06/2014, 11:44 AM PGY-3, [facility] FPTS Intern pager: [phone], text pages welcome

## 2014-03-06 NOTE — ED Provider Notes (Signed)
CSN: [id number]     Arrival date & time 03/06/14  1041 History   First MD Initiated Contact with Patient 03/06/14 1051     Chief Complaint  Patient presents with  . Chest Pain      HPI Pt was seen at 1100. Per pt, c/o gradual onset and persistence of constant chest "pain" for the past 2 weeks. CP has been associated with palpitations, SOB, nausea and lightheadedness. Describes the palpitations as "my heart feels like it's beating out of my chest." CP located over her central and right chest; worsens with palpation of the area. Pt states her symptoms occur nightly, sometimes during the day. Symptoms improve with resting, bearing down and splashing cold water on her face. [facility] for these symptoms over the past 2 weeks. Pt was sent to the ED by her PMD for admission. Denies cough, no abd pain, no vomiting/diarrhea, no back pain, no fevers, no syncope.     Past Medical History  Diagnosis Date  . Substance abuse   . Hypertension   . Asthma   . Migraine headache    Past Surgical History  Procedure Laterality Date  . Knee surgery      History  Substance Use Topics  . Smoking status: Current Every Day Smoker -- 0.50 packs/day    Types: Cigarettes  . Smokeless tobacco: Not on file     Comment: decreased smoking/ uses e-cigarette  . Alcohol Use: Yes     Comment: occasional    Review of Systems ROS: Statement: All systems negative except as marked or noted in the HPI; Constitutional: Negative for fever and chills. ; ; Eyes: Negative for eye pain, redness and discharge. ; ; ENMT: Negative for ear pain, hoarseness, nasal congestion, sinus pressure and sore throat. ; ; Cardiovascular: +CP, palpitations, SOB. Negative for diaphoresis and peripheral edema. ; ; Respiratory: Negative for cough, wheezing and stridor. ; ; Gastrointestinal: +nausea. Negative for vomiting, diarrhea, abdominal pain, blood in stool, hematemesis, jaundice and rectal bleeding. . ; ;  Genitourinary: Negative for dysuria, flank pain and hematuria. ; ; Musculoskeletal: Negative for back pain and neck pain. Negative for swelling and trauma.; ; Skin: Negative for pruritus, rash, abrasions, blisters, bruising and skin lesion.; ; Neuro: +lightheadedness. Negative for headache and neck stiffness. Negative for weakness, altered level of consciousness , altered mental status, extremity weakness, paresthesias, involuntary movement, seizure and syncope.      Allergies  Review of patient's allergies indicates no known allergies.  Home Medications   Prior to Admission medications   Medication Sig Start Date End Date Taking? Authorizing Provider  albuterol (PROVENTIL HFA;VENTOLIN HFA) 108 (90 BASE) MCG/ACT inhaler Inhale 2 puffs into the lungs every 6 (six) hours as needed for wheezing. 05/17/12   Everrett Coombeody Matthews, DO  albuterol (PROVENTIL) (2.5 MG/3ML) 0.083% nebulizer solution Take 3 mL (2.5 mg total) by nebulization every 6 (six) hours as needed for wheezing. 03/05/13   Adrian BlackwaterZachary H Baker, PA-C  amLODipine (NORVASC) 10 MG tablet Take 10 mg by mouth daily.    Historical Provider, MD  cetirizine (ZYRTEC) 10 MG tablet Take 1 tablet (10 mg total) by mouth daily. 01/09/14   Saralyn PilarAlexander Karamalegos, DO  hydrochlorothiazide (HYDRODIURIL) 12.5 MG tablet Take 1 tablet (12.5 mg total) by mouth daily. 01/09/14   Saralyn PilarAlexander Karamalegos, DO  ibuprofen (ADVIL,MOTRIN) 600 MG tablet Take 1 tablet (600 mg total) by mouth every 8 (eight) hours as needed for headache. 01/09/14  Saralyn Pilar, DO  metoprolol (LOPRESSOR) 100 MG tablet Take 100 mg by mouth 2 (two) times daily.    Historical Provider, MD  nortriptyline (PAMELOR) 10 MG capsule Take 1 capsule (10 mg total) by mouth at bedtime. 06/28/13   Abram Sander, MD  zolpidem (AMBIEN) 5 MG tablet Take 5 mg by mouth at bedtime as needed for sleep.    Historical Provider, MD   BP 114/72  Pulse 66  Temp(Src) 98 F (36.7 C) (Oral)  Resp 18  SpO2 100%  LMP  02/28/2014 Physical Exam 1105: Physical examination:  Nursing notes reviewed; Vital signs and O2 SAT reviewed;  Constitutional: Well developed, Well nourished, Well hydrated, In no acute distress; Head:  Normocephalic, atraumatic; Eyes: EOMI, PERRL, No scleral icterus; ENMT: Mouth and pharynx normal, Mucous membranes moist; Neck: Supple, Full range of motion, No lymphadenopathy; Cardiovascular: Regular rate and rhythm, No murmur, rub, or gallop; Respiratory: Breath sounds clear & equal bilaterally, No rales, rhonchi, wheezes.  Speaking full sentences with ease, Normal respiratory effort/excursion; Chest: +anterior chest [place] tender to palp. No deformity. Movement normal; Abdomen: Soft, Nontender, Nondistended, Normal bowel sounds; Genitourinary: No CVA tenderness; Extremities: Pulses normal, No tenderness, No edema, No calf edema or asymmetry.; Neuro: AA&Ox3, Major CN grossly intact.  Speech clear. No gross focal motor or sensory deficits in extremities.; Skin: Color normal, Warm, Dry.   ED Course  Procedures     EKG Interpretation   Date/Time:  Thursday March 06 2014 10:52:48 EDT Ventricular Rate:  61 PR Interval:  140 QRS Duration: 70 QT Interval:  426 QTC Calculation: 429 R Axis:   51 Text Interpretation:  Sinus rhythm Borderline T wave abnormalities When  compared with ECG of 02/25/2014 No significant change was found Confirmed  by [facility]  MD, Nicholos Johns [phone]) on 03/06/2014 11:15:13 AM      MDM  MDM Reviewed: previous chart, nursing note and vitals Reviewed previous: labs and ECG Interpretation: labs, ECG and x-ray    1115:  Pt states she "feels fine now." PMD concerned regarding SVT. T/C to [facility] Resident, case discussed, including:  HPI, pertinent PM/SHx, VS/PE, dx testing, ED course and treatment:  Agreeable to admit, requests to write temporary orders, obtain tele bed to Dr. Tyson Alias service.       Samuel Jester, DO 03/08/14 1520

## 2014-03-06 NOTE — ED Notes (Signed)
Pt coming from home with c/o of central chest pain that has been persisting x 2 weeks.  Pt states she was seen for similar here about 1 week ago.

## 2014-03-06 NOTE — ED Provider Notes (Signed)
CSN: [id number]     Arrival date & time 03/06/14  4098 History   None    Chief Complaint  Patient presents with  . Chest Pain   (Consider location/radiation/quality/duration/timing/severity/associated sxs/prior Treatment) HPI  CP: persistent since previous appointment (14 days total). Occurs mostly at night, but sometimes during the day. Today started while at work. Pt was shoveling hole at time of onset. Fast and pounding heart beat. Associated w/ chest pain, lightheadedness, nausea, HA, diaphoretic. Has not taken anything for the pain. Relieved w/ rest and bearing down. 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 Pain continues to improve while sitting here in clinic. Denies fevers, diarrhea, rash. Occurs every night when trying to go to sleep. Splashes cold water on face to relieve symptoms.  Intermittent ETOH use Denies any drug use or tobacco use.     Past Medical History  Diagnosis Date  . Substance abuse   . Hypertension   . Asthma    Past Surgical History  Procedure Laterality Date  . Knee surgery     History reviewed. No pertinent family history. History  Substance Use Topics  . Smoking status: Current Every Day Smoker -- 0.50 packs/day    Types: Cigarettes  . Smokeless tobacco: Not on file     Comment: decreased smoking/ uses e-cigarette  . Alcohol Use: No   OB History   Grav Para Term Preterm Abortions TAB SAB Ect Mult Living                 Review of Systems Per HPI with all other pertinent systems negative.   Allergies  Review of patient's allergies indicates no known allergies.  Home Medications   Prior to Admission medications   Medication Sig Start Date End Date Taking? Authorizing Provider  albuterol (PROVENTIL HFA;VENTOLIN HFA) 108 (90 BASE) MCG/ACT inhaler Inhale 2 puffs into the lungs every 6 (six) hours as needed for wheezing. 05/17/12   Everrett Coombe, DO  albuterol (PROVENTIL) (2.5 MG/3ML) 0.083% nebulizer solution Take 3 mL (2.5 mg total) by  nebulization every 6 (six) hours as needed for wheezing. 03/05/13   Adrian Blackwater Baker, PA-C  amLODipine (NORVASC) 10 MG tablet Take 10 mg by mouth daily.    Historical Provider, MD  cetirizine (ZYRTEC) 10 MG tablet Take 1 tablet (10 mg total) by mouth daily. 01/09/14   Saralyn Pilar, DO  hydrochlorothiazide (HYDRODIURIL) 12.5 MG tablet Take 1 tablet (12.5 mg total) by mouth daily. 01/09/14   Saralyn Pilar, DO  ibuprofen (ADVIL,MOTRIN) 600 MG tablet Take 1 tablet (600 mg total) by mouth every 8 (eight) hours as needed for headache. 01/09/14   Saralyn Pilar, DO  metoprolol (LOPRESSOR) 100 MG tablet Take 100 mg by mouth 2 (two) times daily.    Historical Provider, MD  nortriptyline (PAMELOR) 10 MG capsule Take 1 capsule (10 mg total) by mouth at bedtime. 06/28/13   Abram Sander, MD  zolpidem (AMBIEN) 5 MG tablet Take 5 mg by mouth at bedtime as needed for sleep.    Historical Provider, MD   BP 123/79  Pulse 69  Temp(Src) 98.3 F (36.8 C) (Oral)  Resp 18  SpO2 100% Physical Exam  Constitutional: She is oriented to person, place, and time. She appears well-developed and well-nourished. No distress.  HENT:  Head: Normocephalic and atraumatic.  Eyes: EOM are normal. Pupils are equal, round, and reactive to light.  Neck: Normal range of motion. Neck supple. No thyromegaly present.  Cardiovascular: Normal rate and regular rhythm.   No  murmur heard. Pulmonary/Chest: Effort normal and breath sounds normal.  Abdominal: Soft. Bowel sounds are normal. She exhibits no distension.  Musculoskeletal: Normal range of motion. She exhibits tenderness.  Neurological: She is alert and oriented to person, place, and time. No cranial nerve deficit.  Skin: Skin is warm. She is not diaphoretic.  Psychiatric: She has a normal mood and affect. Her behavior is normal. Judgment and thought content normal.    ED Course  Procedures (including critical care time) Labs Review Labs Reviewed - No  data to display  Imaging Review No results found.   MDM   1. Other chest pain   2. Musculoskeletal pain    CP: history concerning for SVT - relieved by vagal maneuvers. Pt w/ persistent daily symptoms. Discused plan with pt and PCP who feel overnight Tele admission would be best. Pt agreable.  EKG unchanged from previous and no sigh of abbarrent arrhythmia [facility]  There is also a MSK component to CP as some pain on palpation of chest [place]. Consider NSAIDs for relief.   Precautions given and all questions answered   Shelly Flattenavid Madie Cahn, MD Family Medicine 03/06/2014, 10:27 AM       Ozella Rocksavid J Tonica Brasington, MD 03/06/14 1027

## 2014-03-06 NOTE — ED Notes (Addendum)
Pt states the chest pain that has been on going x 2 weeks had radiation that was central chest pain in location with what felt like radiation in to the right jaw and in to the right arm.  Pt states she had some numbness and tingling in the right arm.  Pt rates her pain at this time a 3/10.  Pt is tender to palpation to the central chest area.  Vital signs stable 1114/72 BP, 67 pulse, 100%.  Family at bedside.  Dr. Clarene DukeMcManus at bedside.

## 2014-03-07 DIAGNOSIS — K219 Gastro-esophageal reflux disease without esophagitis: Secondary | ICD-10-CM

## 2014-03-07 DIAGNOSIS — R079 Chest pain, unspecified: Secondary | ICD-10-CM | POA: Diagnosis not present

## 2014-03-07 DIAGNOSIS — R071 Chest pain on breathing: Secondary | ICD-10-CM | POA: Diagnosis not present

## 2014-03-07 DIAGNOSIS — J45909 Unspecified asthma, uncomplicated: Secondary | ICD-10-CM | POA: Diagnosis not present

## 2014-03-07 DIAGNOSIS — R002 Palpitations: Secondary | ICD-10-CM | POA: Diagnosis not present

## 2014-03-07 DIAGNOSIS — H8109 Meniere's disease, unspecified ear: Secondary | ICD-10-CM

## 2014-03-07 LAB — BASIC METABOLIC PANEL
ANION GAP: 12 (ref 5–15)
BUN: 8 mg/dL (ref 6–23)
CHLORIDE: 105 meq/L (ref 96–112)
CO2: 21 mEq/L (ref 19–32)
Calcium: 9 mg/dL (ref 8.4–10.5)
Creatinine, Ser: 0.81 mg/dL (ref 0.50–1.10)
GFR calc Af Amer: >90 mL/min (ref >90)
GFR calc non Af Amer: >90 mL/min (ref >90)
Glucose, Bld: 107 mg/dL — ABNORMAL HIGH (ref 70–99)
POTASSIUM: 3.3 meq/L — AB (ref 3.7–5.3)
Sodium: 138 mEq/L (ref 137–147)

## 2014-03-07 LAB — TROPONIN I: Troponin I: <0.3 ng/mL (ref <0.30)

## 2014-03-07 LAB — LIPID PANEL
Cholesterol: 157 mg/dL (ref 0–200)
HDL: 50 mg/dL (ref >39)
LDL CALC: 84 mg/dL (ref 0–99)
Total CHOL/HDL Ratio: 3.1 RATIO
Triglycerides: 115 mg/dL (ref <150)
VLDL: 23 mg/dL (ref 0–40)

## 2014-03-07 MED ORDER — POTASSIUM CHLORIDE 10 MEQ/100ML IV SOLN
10.0000 meq | INTRAVENOUS | Status: AC
  Administered 2014-03-07 (×3): 10 meq via INTRAVENOUS
  Filled 2014-03-07 (×3): qty 100

## 2014-03-07 MED ORDER — METOPROLOL TARTRATE 12.5 MG HALF TABLET: 12.5000 mg | ORAL_TABLET | Freq: Two times a day (BID) | ORAL | Status: DC

## 2014-03-07 NOTE — Discharge Instructions (Signed)
Ms Daleen SquibbWall, You were seen in the hospital for concerns for rapid heart beat.   We watched you closely on monitors, obtained cardiac enzymes and looked at your electrolytes.  We were pleased to see that these things looked normal.   It will be important for you to follow up with the heart doctors to set you up for a continuous monitor to look for any further events  Please make sure you continue to stay hydrated in the heat and stop work immediately if you feel chest pain, shortness of breath, nausea or vomiting.  These are reasons to call 911.  Keep follow up appointment with your PCP.  Feel better soon! Thanks for letting us take care of you

## 2014-03-07 NOTE — Discharge Summary (Signed)
Family Medicine [facility] Discharge Summary  Patient name: Jessica Hale Medical record number: [id number] Date of birth: 07-Oct-1975 Age: 38 y.o. Gender: female Date of Admission: 03/06/2014  Date of Discharge: 03/07/2014 Admitting Physician: Tobey GrimJeffrey H Walden, MD  Primary Care Provider: Saralyn PilarKaramalegos, Alexander, DO Consultants: None  Indication for Hospitalization: Palpitations concerning for SVT    Discharge Diagnoses/Problem List: Palpitations most likely 2/2 costochondritis  Problem list: Hypertension, Asthma, Chronic Headache  Disposition: Discharge to home  Discharge Condition: Improved, stable  Discharge Exam:  Vitals: T: 36.6, P: 63, RR: 18, BP: 105/69 General: NAD  HEENT: NCAT, MMM, PERRL, no palpable thyromegaly  Cardiovascular: RRR, no murmurs. Chest Riedinger tender to palpation.  Respiratory: CTAB, NWOB  Abdomen: +BS. Soft, nontender to palpation  Extremities: No appreciable lower extremity edema bilaterally, calves NTTP  Skin: no rashes noted  Neuro: grossly nonfocal, speech normal  Brief Hospital Course:  Jessica Eonatasha M Christen is a 38 y.o.female with a PMH significant for HTN, GERD, and asthma who p/w chest pain and palpitations concerning for SVT (improvemet with vagal maneuvers), but most likely musculoskeletal in etiology (sympotms reproducible with palpation). EKG on admission showed sinus rhythm and borderline T wave abnormalities, unchanged from previous one on 02/25/2014. Patient was monitored on telemetry overnight, which showed no acute events. Troponins were negative x3. TSH, lipid panel and Hgb A1c were all wnl. Repeat EKG on day of admission showed no changes. Since no events were captured on telemetry, she is scheduled to have a Holter monitor placed on 03/11/14 for continued monitoring. Patient was also discharged home on metoprolol 12.5mg  in setting of low BP but concern for palpitations. Patient was seen on day of discharge and all questions were answered.    Issues for Follow Up:  1. Heart palpitations: No acute events on telemetry during admission. Pt is scheduled for a Holter monitor next Tuesday. Pending results, may need further cardiology eval and management. 2. HTN: Pt remained normotensive while inpatient and was discharged on metoprolol 12.5mg  in setting of normotension but concern for palpitations. Please re-evaluate this change in medication and need for anti-hypertensive medications. 3. Costochondral tenderness: Present on admission and discharge, please f/u regarding better pain management as an outpatient.  Significant Procedures: None  Significant Labs and Imaging:   Recent Labs Lab 03/06/14 1115 03/06/14 1127  WBC 6.8  --   HGB 12.5 13.3  HCT 35.6* 39.0  PLT 319  --     Recent Labs Lab 03/06/14 1127 03/06/14 1420 03/07/14 0100  NA 139 138 138  K 3.9 3.6* 3.3*  CL 108 105 105  CO2  --  21 21  GLUCOSE 86 132* 107*  BUN 11 10 8   CREATININE 0.90 0.81 0.81  CALCIUM  --  9.0 9.0  ALKPHOS  --  53  --   AST  --  24  --   ALT  --  28  --   ALBUMIN  --  3.8  --    Lipid Panel     Component Value Date/Time   CHOL 157 03/07/2014 0100   TRIG 115 03/07/2014 0100   HDL 50 03/07/2014 0100   CHOLHDL 3.1 03/07/2014 0100   VLDL 23 03/07/2014 0100   LDLCALC 84 03/07/2014 0100   Troponin negative x 3 TSH: 1.740 Hgb A1c: 5.4 Telemetry: No acute events EKG (8/20): Sinus rhythm with borderline T wave abnormalities. When compared with ECG of 02/25/2014, no significant change was found.  Results/Tests Pending at Time of Discharge: None  Discharge Medications:    Medication List    ASK your doctor about these medications       albuterol 108 (90 BASE) MCG/ACT inhaler  Commonly known as:  PROVENTIL HFA;VENTOLIN HFA  Inhale 2 puffs into the lungs every 6 (six) hours as needed for wheezing.     albuterol (2.5 MG/3ML) 0.083% nebulizer solution  Commonly known as:  PROVENTIL  Take 3 mL (2.5 mg total) by nebulization every 6  (six) hours as needed for wheezing.     amLODipine 10 MG tablet  Commonly known as:  NORVASC  Take 10 mg by mouth daily.     cyclobenzaprine 5 MG tablet  Commonly known as:  FLEXERIL  Take 5 mg by mouth 3 (three) times daily as needed for muscle spasms.     ibuprofen 600 MG tablet  Commonly known as:  ADVIL,MOTRIN  Take 1 tablet (600 mg total) by mouth every 8 (eight) hours as needed for headache.     metoprolol 100 MG tablet  Commonly known as:  LOPRESSOR  Take 100 mg by mouth 2 (two) times daily.     nortriptyline 10 MG capsule  Commonly known as:  PAMELOR  Take 1 capsule (10 mg total) by mouth at bedtime.     zolpidem 5 MG tablet  Commonly known as:  AMBIEN  Take 5 mg by mouth at bedtime as needed for sleep.        Discharge Instructions: Please refer to Patient Instructions section of EMR for full details.  Patient was counseled important signs and symptoms that should prompt return to medical care, changes in medications, dietary instructions, activity restrictions, and follow up appointments.   Follow-Up Appointments: Follow-up Information   Follow up with Saralyn Pilar, DO. Call on 03/19/2014. (Appointment scheduled for 10:45am on Wednesday, 03/19/14 for hospital and Holter monitor follow-up)    Specialty:  Osteopathic Medicine   Contact information:   [address] [place] [address] [phone]       Follow up with [facility] - Cardiology. Call on 03/11/2014. (Appointment scheduled for 10:30am on Tuesday, 03/11/14, to have your Holter monitor placed)    Contact information:   [address] [phone]      Clenton Pare, Med Student 03/07/2014, 9:40 AM Above note was written jointly by myself and Dr. [place] Callas ([id number]) Anselm Lis, MD PGY-2, [facility] Family Medicine

## 2014-03-07 NOTE — Progress Notes (Signed)
[facility] PCP Visit - Progress Note  I have seen patient and discussed current hospitalization. Currently Jessica Hale is feeling well, denies any recurrent episodes since hospitalization, she is ready and eager to go home today. She explained the event that brought her initially to Urgent Care and then to ED, said that it had been occurring intermittently and with more frequency over the past 2 weeks. We discussed the current results, and provided reassurance. Explained plan for 48 hr Holter monitor placement on Tuesday 8/25, will review results, and then discuss in clinic.  I agree with the current hospital plan outlined by the primary team [facility] and want to thank them for their continued excellent care and efforts in caring for my patient. I will anticipate to follow-up with the patient in the clinic after discharge from hospital.   Saralyn PilarAlexander Karamalegos, DO [facility], PGY-2

## 2014-03-07 NOTE — Progress Notes (Signed)
UR completed 

## 2014-03-07 NOTE — Progress Notes (Signed)
Went over discharge instructions with patient. IV discontinued, patient taken off cardiac monitor. Patient has follow up appointment to have Holter monitor placed. Patient discharged home. Stanton KidneyCURRY, Hue Frick R

## 2014-03-07 NOTE — H&P (Signed)
FMTS Attending Admission Note: Renold DonJeff Quinnetta Roepke MD Personal pager:  [phone] FPTS Service Pager:  [phone]  I  have seen and examined this patient, reviewed their chart. I have discussed this patient with the resident. I agree with the resident's findings, assessment and care plan.  Additionally:  - Appears to be SVT as improvement with vagal maneuvers.   - Possibilty of anxiety component as well. - Tele has been normal.  EKG normal. Plan is for Holter after DC.    Tobey GrimJeffrey H Franchon Ketterman, MD 03/07/2014 3:15 PM

## 2014-03-07 NOTE — Care Management Note (Signed)
    Page 1 of 1   03/07/2014     2:21:03 PM CARE MANAGEMENT NOTE 03/07/2014  Patient:  Jessica Hale,Jessica Hale   Account Number:  [id number]  Date Initiated:  03/07/2014  Documentation initiated by:  Sherece Gambrill  Subjective/Objective Assessment:   Pt adm on 03/06/14 with palpitations.  PTA, pt independent of ADLS.     Action/Plan:   Will follow for dc needs as pt progresses.   Anticipated DC Date:  03/07/2014   Anticipated DC Plan:  HOME/SELF CARE      DC Planning Services  CM consult      Choice offered to / List presented to:             Status of service:  Completed, signed off Medicare Important Message given?   (If response is "NO", the following Medicare IM given date fields will be blank) Date Medicare IM given:   Medicare IM given by:   Date Additional Medicare IM given:   Additional Medicare IM given by:    Discharge Disposition:  HOME/SELF CARE  Per UR Regulation:  Reviewed for med. necessity/level of care/duration of stay  If discussed at Long Length of Stay Meetings, dates discussed:    Comments:

## 2014-03-09 NOTE — Discharge Summary (Signed)
Family Medicine Teaching Service  Discharge Note : Attending Jeff Samael Blades MD Pager [phone] Inpatient Team Pager:  [phone]  I have reviewed this patient and the patient's chart and have discussed discharge planning with the resident at the time of discharge. I agree with the discharge plan as above.    

## 2014-03-10 ENCOUNTER — Other Ambulatory Visit: Payer: Self-pay | Admitting: *Deleted

## 2014-03-10 DIAGNOSIS — R002 Palpitations: Secondary | ICD-10-CM

## 2014-03-11 ENCOUNTER — Encounter: Payer: Self-pay | Admitting: *Deleted

## 2014-03-11 ENCOUNTER — Encounter (INDEPENDENT_AMBULATORY_CARE_PROVIDER_SITE_OTHER): Payer: 59

## 2014-03-11 ENCOUNTER — Other Ambulatory Visit: Payer: Self-pay | Admitting: *Deleted

## 2014-03-11 DIAGNOSIS — R002 Palpitations: Secondary | ICD-10-CM

## 2014-03-11 MED ORDER — ZOLPIDEM TARTRATE 5 MG PO TABS
5.0000 mg | ORAL_TABLET | Freq: Every evening | ORAL | Status: DC | PRN
Start: 2014-03-11 — End: 2014-06-11

## 2014-03-11 NOTE — Progress Notes (Signed)
Patient ID: Jessica Hale, female   DOB: 22-Oct-1975, 38 y.o.   MRN: [id number] [facility] 48 hour holter monitor applied to patient.

## 2014-03-19 ENCOUNTER — Encounter: Payer: Self-pay | Admitting: Family Medicine

## 2014-03-19 ENCOUNTER — Ambulatory Visit (INDEPENDENT_AMBULATORY_CARE_PROVIDER_SITE_OTHER): Payer: 59 | Admitting: Family Medicine

## 2014-03-19 VITALS — BP 111/78 | HR 77 | Temp 98.0°F | Wt 165.0 lb

## 2014-03-19 DIAGNOSIS — I10 Essential (primary) hypertension: Secondary | ICD-10-CM

## 2014-03-19 DIAGNOSIS — E876 Hypokalemia: Secondary | ICD-10-CM

## 2014-03-19 DIAGNOSIS — R109 Unspecified abdominal pain: Secondary | ICD-10-CM

## 2014-03-19 DIAGNOSIS — R002 Palpitations: Secondary | ICD-10-CM

## 2014-03-19 NOTE — Assessment & Plan Note (Signed)
Persistent complaint of symptomatic palpitations / suspected SVT (seems to respond to vagal maneuvers/cold water) - s/p hospitalization 8/20 - 8/21, without significant events on telemetry - s/p Holter monitor 48 hours, 8/25 - 8/27 (pending results per Cardiology) - Unclear triggers for current symptoms, suspect component of anxiety, highly suspicious for medication side-effect from Nortriptyline (inc to  nightly seems to correlate to onset of patient's new palpitation symptoms)  Plan: 1. Discontinue Notriptyline - taper off over 2 weeks (qod, q 2 days) 2. F/u results Holter Monitor, pending results anticipate referral to Cardiology for evaluation 3. Continue low dose beta blocker 4. RTC if worsening

## 2014-03-19 NOTE — Assessment & Plan Note (Signed)
Well-controlled HTN - Previously on HCTZ for Meniere's Disease No complications   Plan:  1. Continue recently reduced BP meds, Metoprolol 12.5mg  BID, and Amlodipine  daily 2. Consider transition from Metoprolol to alternative beta-blocker such as propanolol for migraine prophylaxis 3. Consider decreasing Amlodipine to  daily if BP remains very well controlled 4. Lifestyle Mods - Smoking cessation, Exercise, Dec salt intake, inc K+ rich vegs

## 2014-03-19 NOTE — Assessment & Plan Note (Signed)
Recently with K 3.3 during hospitalization - Hx chronic hypokalemia reportedly, not entirely supported by chart review lab results - Reported similar abdominal and generalized cramps from "hypokalemia"  Plan: 1. Remain off HCTZ (discontinued 1 month ago), BP stable, avoid excess K loss 2. BMET, follow-up eval of K after hospitalization 3. If K is low, consider sending rx for daily K supplement trial

## 2014-03-19 NOTE — Progress Notes (Signed)
   Subjective:    Patient ID: Jessica Hale, female    DOB: 05-19-1976, 38 y.o.   MRN: [id number]  Patient presents for a hospital follow-up appointment.  HPI  PALPITATIONS / SVT: - recently hospitalized for symptomatic SVT, 8/20 to 8/21 - Followed up with Cardiology, had 48 hr Holter monitor placed on 8/25 removed on 8/27 - reports feeling "up and down" since leaving hospital, states she still has occasional problems related "rhyhtm", still feels like her heart is beating out of control, states that during the day symptoms come and go, last episode yesterday 4:15pm while standing in line at bank, associated with dizzy spell. Does report that heart seems to race immediately after eating - Thinks that metoprolol is making her "tired", however tolerated previously with higher doses - Improvement with vagal maneuvers and cold water stimulation during episodes - Overall, episodes of palpitations have been going on since July 2015, without prior episodes - Admits intermittent nausea without vomiting - Denies chest pain, chest tightness, SOB, lightheadedness  ABDOMINAL PAIN / CONSTIPATION: - Hx intermittent similar episodes over past several years, without any significant treatment - Also reports that since Thursday last week, has had difficulty with stomach cramps, abdominal pain radiating to back, associated with constipation and bloating. Denies fevers, diarrhea. Tried laxative without success. Last normal BM on Friday, had small BM today and is passing little gas. Tolerating food and liquids. - Concerned she has problems with potassium, described Right hand tingling, and leg cramps. Has discontinued HCTZ 12.5mg  daily for past 1 month (concern for low K). In the hospital she had low K at 3.3. Reports chronic low potassium as child with recurrent cramps - Denies any blood in stool, no sick contacts, no fevers, without active abdominal pain, flank pain  CHRONIC HTN: Reports no concerns Current  Meds - Metoprolol reduced down to 12.5mg  BID (from  BID, during hospitalization), Amlodipine  daily, discontinued HCTZ 12.5mg  (x 1 month) Reports good compliance, took meds today. Tolerating well, w/o complaints. Lifestyle - physically active at work, exercises regularly  Migraine Headaches, chronic: - Well controlled on Nortriptyline  nightly, since 01/09/14 has been taking nightly as previously only taking PRN - Questions is symptoms can be related to side-effects of this medication  I have reviewed and updated the following as appropriate: allergies and current medications  Social Hx: Active smoker, now smoking hookah instead of cigarettes  Review of Systems  See above HPI    Objective:   Physical Exam  BP 111/78  Pulse 77  Temp(Src) 98 F (36.7 C) (Oral)  Wt 165 lb (74.844 kg)  LMP 02/28/2014  Gen - well-appearing, pleasant, cooperative, NAD HEENT - oropharynx clear, dry MM and tongue Neck - supple, non-tender Heart - RRR, no murmurs heard Lungs - CTAB, no wheezing, crackles, or rhonchi. Normal work of breathing. Abd - soft, NTND, no rebound or guarding, no masses, +active BS Ext - non-tender, no edema, peripheral pulses intact +2 b/l Skin - warm, dry, no rashes Neuro - awake, alert, oriented, grossly non-focal, intact muscle strength 5/5 b/l, intact distal sensation to light touch, gait normal     Assessment & Plan:   See specific A&P problem list for details.

## 2014-03-19 NOTE — Assessment & Plan Note (Signed)
Intermittent improvement, associated with constipation, suspect partial component of mild dehydration, concern for possible hypokalemia - No active abdominal pain on exam today, afebrile, no red flags, no blood in stool, passing gas - Suspect component of IBS in setting of chronic similar symptoms, self-limited  Plan: 1. BMET 2. Recommend trial Miralax PRN, fiber supplement, may use laxative PRN (avoid over-use) 3. RTC worsening

## 2014-03-19 NOTE — Patient Instructions (Signed)
Dear Jessica Hale, Thank you for coming in to clinic today. It was good to see you!  Today we discussed your Heart Rate, Potassium, Medicines, and Abdominal Cramping/Constipation. 1. For your heart palpitations, we will continue to wait for the Holter Monitor report. If we do not hear back we will call the Cardiology office. Please call us if you hear back or get any results. Afterwards, we will most likely refer you to see the Cardiologist as well. Also avoid triggers such as caffeine, if possible encourage you to cut back and quit smoking as Nicotine can play a roll as well. 2. We will taper you off of Nortriptyline to see if it was causing your symptoms, caution your migraines may get worse, we will address this in the future.  - Take Nortriptyline  capsule every OTHER night for 1 week  - Then take it every TWO nights for 1 week  - Stop taking it all together by week 3 3. For your potassium, we will check your blood today, and will call you with results. If it is low we can start a regular potassium supplement pill for 1 to 3 months and follow-up. 4. For your Constipation, recommend trial of OTC Miralax as needed use 1 capful daily for 3 days, then may increase to twice daily as needed for constipation. Recommend starting high fiber diet (or fiber supplement with Metamucil as well).  Some important numbers from today's visit: BP - 111/78, Heart Rate 77 Results -  Please schedule a follow-up appointment with me (Dr. Althea Charon) in 1 to 2 months to follow-up Palpitations and Migraines.  If you have any other questions or concerns, please feel free to call the clinic to contact me. You may also schedule an earlier appointment if necessary.  However, if your symptoms get significantly worse, please go to the Emergency Department to seek immediate medical Marcelle Smilingntion.  Saralyn Pilar, DO [facility]

## 2014-03-20 LAB — BASIC METABOLIC PANEL
BUN: 11 mg/dL (ref 6–23)
CALCIUM: 9.5 mg/dL (ref 8.4–10.5)
CO2: 21 mEq/L (ref 19–32)
Chloride: 107 mEq/L (ref 96–112)
Creat: 0.85 mg/dL (ref 0.50–1.10)
GLUCOSE: 96 mg/dL (ref 70–99)
POTASSIUM: 4.2 meq/L (ref 3.5–5.3)
SODIUM: 138 meq/L (ref 135–145)

## 2014-04-16 ENCOUNTER — Encounter: Payer: Self-pay | Admitting: Family Medicine

## 2014-04-16 ENCOUNTER — Ambulatory Visit (INDEPENDENT_AMBULATORY_CARE_PROVIDER_SITE_OTHER): Payer: 59 | Admitting: Family Medicine

## 2014-04-16 VITALS — BP 113/84 | HR 75 | Temp 99.1°F | Ht 62.0 in | Wt 166.4 lb

## 2014-04-16 DIAGNOSIS — I10 Essential (primary) hypertension: Secondary | ICD-10-CM

## 2014-04-16 DIAGNOSIS — Z23 Encounter for immunization: Secondary | ICD-10-CM

## 2014-04-16 DIAGNOSIS — K5900 Constipation, unspecified: Secondary | ICD-10-CM

## 2014-04-16 DIAGNOSIS — R002 Palpitations: Secondary | ICD-10-CM

## 2014-04-16 MED ORDER — GLYCERIN (LAXATIVE) 2 G RE SUPP: 1.0000 | Freq: Once | RECTAL | Status: DC

## 2014-04-16 MED ORDER — POLYETHYLENE GLYCOL 3350 17 GM/SCOOP PO POWD: 17.0000 g | Freq: Two times a day (BID) | ORAL | Status: DC | PRN

## 2014-04-16 NOTE — Progress Notes (Signed)
Patient ID: Selinda Eonatasha M Mcdonell, female   DOB: 03-12-1976, 38 y.o.   MRN: [id number]   Subjective:   HPI  PALPITATIONS / SVT: - Presents for follow-up appointment after recent course of SVT with palpitations, requiring hospitalization (8/20 to 8/21), completed 48 hr Holter monitor per Cardiology (results with NSR and no ectopic activities associated with patient reported symptoms of palpitations, identified rare Supraventricular Events (SVEs), wandering pacemaker, otherwise negative exam), patient was called and notified of these results at that time. - Currently patient reports no further episodes of SVT / Palpitations, she believes that as we discussed before, most likely the Nortriptyline was the cause of her symptoms, overall has been doing well off of this medication - Denies any CP, SOB, palpitations, dizziness / lightheadedness  CONSTIPATION: - Hx chronic intermittent constipation since child, also associated with abdominal cramping previously. At last OV discussed current episode of cramping / constipation. - Currently reports persistent worsening constipation over past 1 month, states "really hard to go to bathroom", tried laxative without significant relief, only thing seems to work is water enema. States she is exercising appropriately, drinking plenty of water, and trying to eat healthy and eat a high fiber diet, switched to Activa yogurt - When does have BM, describes stool as hard, non-bloody - Tried "all-natural laxative", laxative tea, uses water enema with bottle - Denies any blood in stool, no sick contacts, no fevers, without active abdominal pain, flank pain  CHRONIC HTN: Reports no concerns Current Meds - Metoprolol reduced down to 12.5mg  BID, Amlodipine 10mg  daily Reports good compliance, took meds today. Tolerating well, w/o complaints.  I have reviewed and updated the following as appropriate: allergies and current medications  Social Hx: - Active smoker  Review of  Systems  See above HPI    Objective:   Physical Exam  BP 113/84  Pulse 75  Temp(Src) 99.1 F (37.3 C) (Oral)  Ht 5\' 2"  (1.575 m)  Wt 166 lb 6.4 oz (75.479 kg)  BMI 30.43 kg/m2  LMP 04/01/2014  Gen - well-appearing, pleasant, NAD HEENT - MMM Heart - RRR, no murmurs heard Lungs - CTAB. Normal work of breathing. Abd - soft, NTND, no rebound or guarding, no masses, +active BS Ext - non-tender, no edema, peripheral pulses intact +2 b/l     Assessment & Plan:   See specific A&P problem list for details.

## 2014-04-16 NOTE — Assessment & Plan Note (Signed)
Well-controlled HTN No complications   Plan:  1. Continue recently reduced BP meds, Metoprolol 12.5mg  BID, and Amlodipine 10mg  daily 2. Consider transition from Metoprolol to alternative beta-blocker such as propanolol for migraine prophylaxis 3. Consider decreasing Amlodipine to 5mg  daily if BP remains very well controlled 4. Lifestyle Mods - Smoking cessation, Exercise, Dec salt intake, inc K+ rich vegs

## 2014-04-16 NOTE — Patient Instructions (Signed)
Dear Jessica Hale, Thank you for coming in to clinic today. It was good to see you again!  Today we discussed your Palpitations and Constipation. 1. It sounds like the Nortriptyline was most likely the cause of your palpitations. It may also be affecting your bowels and related to the constipation 2. Prescribed Miralax - take 1 capful (17g) daily for 3-5 days, if not responding, may increase to 1 capful twice daily. Do this regularly (every day) for up to 2-3 weeks for results. 3. Prescribed Glycerin suppository for severe constipation, use as needed. - Continue hydrating well with increased water intake, high fiber diet (may do supplement as needed), continue exercising  - In future we can discuss decreasing other BP meds  Some important numbers from today's visit: BP - 113/84 Results -  Please schedule a follow-up appointment with me (Dr. Althea CharonKaramalegos) as needed or within 3 months for follow-up.  If you have any other questions or concerns, please feel free to call the clinic to contact me. You may also schedule an earlier appointment if necessary.  However, if your symptoms get significantly worse, please go to the Emergency Department to seek immediate medical attention.  Saralyn PilarAlexander Namita Yearwood, DO [facility]   Constipation Constipation is when a person has fewer than three bowel movements a week, has difficulty having a bowel movement, or has stools that are dry, hard, or larger than normal. As people grow older, constipation is more common. If you try to fix constipation with medicines that make you have a bowel movement (laxatives), the problem may get worse. Long-term laxative use may cause the muscles of the colon to become weak. A low-fiber diet, not taking in enough fluids, and taking certain medicines may make constipation worse.  CAUSES   Certain medicines, such as antidepressants, pain medicine, iron supplements, antacids, and water pills.   Certain diseases,  such as diabetes, irritable bowel syndrome (IBS), thyroid disease, or depression.   Not drinking enough water.   Not eating enough fiber-rich foods.   Stress or travel.   Lack of physical activity or exercise.   Ignoring the urge to have a bowel movement.   Using laxatives too much.  SIGNS AND SYMPTOMS   Having fewer than three bowel movements a week.   Straining to have a bowel movement.   Having stools that are hard, dry, or larger than normal.   Feeling full or bloated.   Pain in the lower abdomen.   Not feeling relief after having a bowel movement.  DIAGNOSIS  Your health care provider will take a medical history and perform a physical exam. Further testing may be done for severe constipation. Some tests may include:  A barium enema X-ray to examine your rectum, colon, and, sometimes, your small intestine.   A sigmoidoscopy to examine your lower colon.   A colonoscopy to examine your entire colon. TREATMENT  Treatment will depend on the severity of your constipation and what is causing it. Some dietary treatments include drinking more fluids and eating more fiber-rich foods. Lifestyle treatments may include regular exercise. If these diet and lifestyle recommendations do not help, your health care provider may recommend taking over-the-counter laxative medicines to help you have bowel movements. Prescription medicines may be prescribed if over-the-counter medicines do not work.  HOME CARE INSTRUCTIONS   Eat foods that have a lot of fiber, such as fruits, vegetables, whole grains, and beans.  Limit foods high in fat and processed sugars, such as french fries, hamburgers,  cookies, candies, and soda.   A fiber supplement may be added to your diet if you cannot get enough fiber from foods.   Drink enough fluids to keep your urine clear or pale yellow.   Exercise regularly or as directed by your health care provider.   Go to the restroom when you have  the urge to go. Do not hold it.   Only take over-the-counter or prescription medicines as directed by your health care provider. Do not take other medicines for constipation without talking to your health care provider first.  SEEK IMMEDIATE MEDICAL CARE IF:   You have bright red blood in your stool.   Your constipation lasts for more than 4 days or gets worse.   You have abdominal or rectal pain.   You have thin, pencil-like stools.   You have unexplained weight loss. MAKE SURE YOU:   Understand these instructions.  Will watch your condition.  Will get help right away if you are not doing well or get worse. Document Released: 04/01/2004 Document Revised: 07/09/2013 Document Reviewed: 04/15/2013 [facility] Patient Information 2015 [place], [place]. This information is not intended to replace advice given to you by your health care provider. Make sure you discuss any questions you have with your health care provider.

## 2014-04-16 NOTE — Assessment & Plan Note (Signed)
Currently resolved. No further episodes palpitations / SVT since last OV 03/19/14, suspected side-effect from Nortriptyline (since discontinued 1-2 months) - Holter Monitor 48 hr (8/25) results - NSR and no ectopic activities associated with patient reported symptoms of palpitations, identified rare Supraventricular Events (SVEs), wandering pacemaker, otherwise negative exam) - No red flags. No CP/SOB, dizzy spells  Plan: 1. Monitor, remain off of Nortriptyline 2. Continue low dose beta blocker, consider transition to propanolol for migraine prophylaxis 3. If palpitations / SVT returns, would plan for referral to Cardiology

## 2014-04-16 NOTE — Assessment & Plan Note (Signed)
Current worsening constipation x 1 month, only responding to water enemas, hard stools. Only change discontinued Nortriptyline. Suspect functional constipation, possibly due to recent discontinue TCA. - Hx chronic intermittent constipation / abdominal cramping, suspect potentially IBS related, no prior diagnosis or extensive work-up to date. No prior colonoscopy - No red flag symptoms, no rectal bleeding or bloody stool, weight stable, significant abdominal pain. Exam reassuring and active bowel sounds  Plan: 1. Start Miralax 17g daily PRN (start regularly 17g daily, titrate up to BID, goal to take 1-2 weeks, titrate for response) 2. Rx Glycerin suppository (#10, refills) PRN 3. Continue inc water hydration, exercise, high fiber diet, yogurt 4. Consider future work-up for chronic constipation and possible GI referral if worsening/persistent

## 2014-04-28 ENCOUNTER — Encounter: Payer: Self-pay | Admitting: Family Medicine

## 2014-04-28 ENCOUNTER — Ambulatory Visit
Admission: RE | Admit: 2014-04-28 | Discharge: 2014-04-28 | Disposition: A | Discharge status: Home / Self Care | Payer: 59 | Source: Ambulatory Visit | Attending: Family Medicine | Admitting: Family Medicine

## 2014-04-28 ENCOUNTER — Ambulatory Visit (INDEPENDENT_AMBULATORY_CARE_PROVIDER_SITE_OTHER): Payer: 59 | Admitting: Family Medicine

## 2014-04-28 VITALS — BP 127/86 | HR 73 | Temp 98.5°F | Wt 168.0 lb

## 2014-04-28 DIAGNOSIS — K5900 Constipation, unspecified: Secondary | ICD-10-CM

## 2014-04-28 MED ORDER — LACTULOSE 10 G PO PACK: 10.0000 g | PACK | Freq: Every day | ORAL | Status: DC

## 2014-04-28 NOTE — Progress Notes (Signed)
Patient ID: Jessica Hale, female   DOB: 01-27-1976, 38 y.o.   MRN: [id number]  Marikay AlarEric Zayra Devito, MD Phone: [phone]  Jessica Eonatasha M Digangi is a 38 y.o. female who presents today for same day appointment.  Constipation: has been present for 2 months. Notes she has tried miralax, glycerin suppository, all natural laxatives, equate stool softeners, warm water enema, coffee enema, dulcolax suppositories, fiber, and some over the counter drink to help with BMs. She has a BM 1x/wk. Hard balls. Not many of them. No blood in stool, diarrhea, or weight loss. No history of abdominal surgery or colocoscopy. No fevers. Some nausea. 2 episodes of vomiting in 2 months. Has been on amlodipine for a couple of years per her report.  Patient is a nonsmoker.   ROS: Per HPI   Physical Exam Filed Vitals:   04/28/14 0940  BP: 127/86  Pulse: 73  Temp: 98.5 F (36.9 C)    Gen: Well NAD HEENT: PERRL,  MMM Lungs: CTABL Nl WOB Heart: RRR no MRG Abd: soft, mild tenderness throughout the abdomen, no guarding or rebound, no masses palpated, ND Rectal: normal sphincter tone, small amount of soft stool felt in the rectal vault Exts: Non edematous BL  LE, warm and well perfused.    Assessment/Plan: Please see individual problem list.   Marikay AlarEric Lorianna Spadaccini, MD [facility] PGY-3

## 2014-04-28 NOTE — Patient Instructions (Signed)
Nice to meet you. Please go to [facility] for an x-ray of your belly. Start taking the lactulose daily for your constipation. If you are not seeing a difference by the end of the week please schedule a follow up appointment with Dr Malachi ParadiseKaramelegos. If you start having vomiting, worsening nausea, blood in your stool, diarrhea, or abdominal pain that changes please let us know.

## 2014-04-29 NOTE — Progress Notes (Signed)
Pt is aware of results. Jazmin Hartsell,CMA  

## 2014-04-29 NOTE — Assessment & Plan Note (Addendum)
Patient continues to have issues with constipation. Patient has tried a multitude of therapies for this with no improvement. It is possible that there is a functional cause to this constipation though if it continues to be an issue I would consider changing her amlodipine to another BP medication as this could be contributing to her constipation. Will obtain KUB to ensure no ileus or signs of SBO. Will start on lactulose daily. She is to follow-up in one week with her PCP.  Precepted with Dr Lum BabeEniola

## 2014-05-05 ENCOUNTER — Ambulatory Visit: Payer: 59 | Admitting: Family Medicine

## 2014-06-11 ENCOUNTER — Other Ambulatory Visit: Payer: Self-pay | Admitting: *Deleted

## 2014-06-11 MED ORDER — ZOLPIDEM TARTRATE 5 MG PO TABS: 5.0000 mg | ORAL_TABLET | Freq: Every evening | ORAL | Status: DC | PRN

## 2014-06-11 NOTE — Telephone Encounter (Signed)
rx called in verbally. Jessica Hale  

## 2014-08-01 ENCOUNTER — Encounter: Payer: Self-pay | Admitting: Family Medicine

## 2014-08-01 ENCOUNTER — Ambulatory Visit (INDEPENDENT_AMBULATORY_CARE_PROVIDER_SITE_OTHER): Payer: 59 | Admitting: Family Medicine

## 2014-08-01 VITALS — BP 136/87 | HR 86 | Temp 98.4°F | Ht 62.0 in | Wt 169.3 lb

## 2014-08-01 DIAGNOSIS — M545 Low back pain, unspecified: Secondary | ICD-10-CM | POA: Insufficient documentation

## 2014-08-01 DIAGNOSIS — I10 Essential (primary) hypertension: Secondary | ICD-10-CM

## 2014-08-01 DIAGNOSIS — G5601 Carpal tunnel syndrome, right upper limb: Secondary | ICD-10-CM

## 2014-08-01 DIAGNOSIS — M7989 Other specified soft tissue disorders: Secondary | ICD-10-CM

## 2014-08-01 DIAGNOSIS — G5603 Carpal tunnel syndrome, bilateral upper limbs: Secondary | ICD-10-CM

## 2014-08-01 DIAGNOSIS — G8929 Other chronic pain: Secondary | ICD-10-CM | POA: Insufficient documentation

## 2014-08-01 DIAGNOSIS — S161XXA Strain of muscle, fascia and tendon at neck level, initial encounter: Secondary | ICD-10-CM

## 2014-08-01 DIAGNOSIS — Z716 Tobacco abuse counseling: Secondary | ICD-10-CM

## 2014-08-01 DIAGNOSIS — G5602 Carpal tunnel syndrome, left upper limb: Secondary | ICD-10-CM

## 2014-08-01 MED ORDER — NAPROXEN 500 MG PO TABS: 500.0000 mg | ORAL_TABLET | Freq: Two times a day (BID) | ORAL | Status: DC

## 2014-08-01 NOTE — Assessment & Plan Note (Addendum)
Mostly improved. Consistent with Right lumbar paraspinal muscle strain, likely due to strenuous activity at work. - No acute injury. No red flags or radicular symptoms  Plan: 1. Naproxen 500mg  BID x 2 weeks, then PRN (plan to continue for carpal tunnel) 2. Continue heat, stretching, exercises as demonstrated 3. May use Flexeril PRN (not taking) 4. RTC PRN

## 2014-08-01 NOTE — Progress Notes (Signed)
Subjective:    Patient ID: Jessica Hale, female    DOB: January 07, 1976, 39 y.o.   MRN: [id number]  HPI  NUMBNESS, TINGLING IN HANDS: - Reports bilateral numbness and tingling in hands intermittently for past 1 month. Denies similar prior history of symptoms. Stated that started only at night when she would wake up with "numb hands, with pins and needles" would take a few minutes to get them moving again. 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 - Denies any significant swelling or redness in hands, weakness, injury or trauma   BACK PAIN, RIGHT LOWER: - Reported that she has had recent flare of back pain, without inciting injury, similar to previous intermittent back pain she has had before due to muscle strains. States it most likely occurred while at work recently about 1-2 weeks ago, felt her right lower back and under shoulder blade were "sore and stiff" after work. Tried heat with some relief. Gradual improvement. Currently today without significant pain. - No longer taking Flexeril (previous rx 01/2014, does not like how it makes her feel) - Denies any lower ext numbness, weakness, or tingling  PAINFUL KNOT ON HEAD/NECK: - Stated that she noticed a knot on the back of her head about 4 days ago associated with HA and neck pain. Denies any trauma or injury. Mostly described pain as soreness, improved with moist heat. - Admits HA - Denies any vision changes, n/v, skin changes or rash, no redness, drainage of pus  FEET SWELLING: - Additinoally, states that recently [place] trip to [place], noticed that her feet were swollen at end of the car ride, and this has happened before at end of long work day. - No history of DVT/PE - Denies any pain, numbness, tingling, or weakness. No calf pain, redness, or swelling.  CHRONIC  HTN: Reports - stopped taking Metoprolol 2 weeks ago and stopped Amlodipine 1 week ago, concerned that these were contributing to her symptoms of hand numbness and some recurrent flares of palpitations Current Meds - none   Denies CP, dyspnea, edema, dizziness / lightheadedness  I have reviewed and updated the following as appropriate: allergies and current medications  Social Hx: - Resumed smoking several months ago, currently 7-8 cigs daily (interested in quitting)  Review of Systems  See above HPI    Objective:   Physical Exam  BP 136/87 mmHg  Pulse 86  Temp(Src) 98.4 F (36.9 C) (Oral)  Ht 5\' 2"  (1.575 m)  Wt 169 lb 4.8 oz (76.794 kg)  BMI 30.96 kg/m2  LMP 07/19/2014  Gen - well-appearing, comfortable, NAD HEENT - NCAT, oropharynx clear, MMM Neck - supple, Right upper cervical paraspinal muscle with small area of hypertonicity at insertion to base of skull with localized tenderness Heart - RRR, no murmurs heard Lungs - CTAB. Normal work of breathing MSK: - Back: spinous processes non-tender to palpation. Right sided upper Lumbar paraspinal muscle tenderness to palpation with mild hypertonicity/spasm. Full active ROM flex/ext, rotation - Bilateral Hands: normal appearance without edema or erythema, no deformities, full active ROM wrists, mild positive Tinel's median nerve (L>R), grip str 5/5 b/l Ext - non-tender, no edema b/l lower ext calves ankles or feet, calves symmetrical, peripheral pulses intact +2 b/l dp Skin - warm, dry. Neck/Scalp - no erythema, pustules, or any evidence of infection  Assessment & Plan:   See specific A&P problem list for details.

## 2014-08-01 NOTE — Assessment & Plan Note (Signed)
Controlled HTN - currently off all anti-HTN meds (self discontinued, concerns of side-effects may have been worsening palpitations?) - Previously had questioned if Amlodipine contributing to constipation (planned to decrease this anyway)  Plan: 1. Stable BP today (considered pre-HTN), off all BP meds x 1-2 weeks - had stopped Metoprolol 12.5mg  BID and Amlodipine 10mg  daily 2. Monitor BP at next f/u, may check outside of clinic 3. If inc BP, plan to switch to low dose HCTZ 12.5mg  daily (previously well tolerated), also [facility] Dr. Raymondo BandKoval for Ambulatory Blood Pressure monitoring

## 2014-08-01 NOTE — Assessment & Plan Note (Signed)
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  Plan: 1. Recommend OTC b/l wrist splints (limit wrist flexion) - wear nightly x 1-3 months, wear during work for strenuous/reptitive activities PRN 2. Naproxen 500mg  BID wc x 1-3 months (DC'd Ibuprofen) 3. Wrist stretching and ROM exercises 4. RTC 1-3 months re-evaluate, goal for continue conservative route, may need period of time off of work if significant worsening and NCS

## 2014-08-01 NOTE — Assessment & Plan Note (Addendum)
Resolved. Two prior episodes, seem provoked due to long car ride (without signs or symptoms of DVT, no prior hx DVT/PE) other provoked by long day of work, resolved with rest. Seems like dependent edema without complications. - Exam normal, monofilament testing normal b/l  Plan: 1. Monitor for recurrence, may use compression and elevation PRN

## 2014-08-01 NOTE — Patient Instructions (Signed)
Dear Jessica Hale, Thank you for coming in to clinic today. It was good to see you again!  1. For your Hand Numbness / Tingling - I think that you have Carpal Tunnel Syndrome in both hands (likely related to your job with repetitive hand straining) - Purchase 2 wrist splints (prevent wrist from flexing) for both wrists that still allow you to work in them, use them as needed while at work (strenuous activities) and wear them EVERY night for at least 1 month. Try the stretching wrist exercise, and range of motion exercises. 2. For your Back Pain - I think that this is a muscle strain also from your work, continue moist heat, stretching, try the exercise I showed you today, and the Naproxen will help this. May use Flexeril PRN 3. For your Head/Neck Pain - Muscle strain as well. 4. For your Feet swelling - try to wear tight compressive socks and elevate as needed for swelling. 5. Blood pressure seems controlled off medications - continue off for now, we will start low dose HCTZ in future if elevated. Check at home bring numbers in  If you are interested in scheduling appointment when ready to quit smoking - call and make apt with "Pharmacy Clinic - Dr. Raymondo BandKoval - Smoking Cessation"  Please schedule a follow-up appointment with Dr. Althea CharonKaramalegos in 1 to 3 months to follow-up.  If you have any other questions or concerns, please feel free to call the clinic to contact me. You may also schedule an earlier appointment if necessary.  However, if your symptoms get significantly worse, please go to the Emergency Department to seek immediate medical attention.  Saralyn PilarAlexander Altie Savard, DO [facility]   Carpal Tunnel Release Carpal tunnel release is done to relieve the pressure on the nerves and tendons on the bottom side of your wrist.  LET YOUR CAREGIVER KNOW ABOUT:   Allergies to food or medicine.  Medicines taken, including vitamins, herbs, eyedrops, over-the-counter medicines, and  creams.  Use of steroids (by mouth or creams).  Previous problems with anesthetics or numbing medicines.  History of bleeding problems or blood clots.  Previous surgery.  Other health problems, including diabetes and kidney problems.  Possibility of pregnancy, if this applies. RISKS AND COMPLICATIONS  Some problems that may happen after this procedure include:  Infection.  Damage to the nerves, arteries or tendons could occur. This would be very uncommon.  Bleeding. BEFORE THE PROCEDURE   This surgery may be done while you are asleep (general anesthetic) or may be done under a block where only your forearm and the surgical area is numb.  If the surgery is done under a block, the numbness will gradually wear off within several hours after surgery. HOME CARE INSTRUCTIONS   Have a responsible person with you for 24 hours.  Do not drive a car or use public transportation for 24 hours.  Only take over-the-counter or prescription medicines for pain, discomfort, or fever as directed by your caregiver. Take them as directed.  You may put ice on the palm side of the affected wrist.  Put ice in a plastic bag.  Place a towel between your skin and the bag.  Leave the ice on for 20 to 30 minutes, 4 times per day.  If you were given a splint to keep your wrist from bending, use it as directed. It is important to wear the splint at night or as directed. Use the splint for as long as you have pain or  numbness in your hand, arm, or wrist. This may take 1 to 2 months.  Keep your hand raised (elevated) above the level of your heart as much as possible. This keeps swelling down and helps with discomfort.  Change bandages (dressings) as directed.  Keep the wound clean and dry. SEEK MEDICAL CARE IF:   You develop pain not relieved with medications.  You develop numbness of your hand.  You develop bleeding from your surgical site.  You have an oral temperature above 102 F (38.9  C).  You develop redness or swelling of the surgical site.  You develop new, unexplained problems. SEEK IMMEDIATE MEDICAL CARE IF:   You develop a rash.  You have difficulty breathing.  You develop any reaction or side effects to medications given. Document Released: 09/24/2003 Document Revised: 09/26/2011 Document Reviewed: 05/10/2007 [facility] Patient Information 2015 [place], [place]. This information is not intended to replace advice given to you by your health care provider. Make sure you discuss any questions you have with your health care provider.

## 2014-08-01 NOTE — Assessment & Plan Note (Signed)
Resumed smoking over past few months, very motivated to quit.  Plan: 1. Brief smoking cessation counseling provided today, but not primary focus of visit. 2. Given info to schedule apt with Dr. Raymondo BandKoval for Pharmacy Clinic Smoking Cessation when ready to quit in future 3. F/u PRN

## 2014-08-01 NOTE — Assessment & Plan Note (Signed)
Exam consistent with Right sided paraspinal upper C-spine muscle strain at insertion to base of skull (likely splenius capitis vs upper trapezius attachment). - No signs of infection or abscess on exam. Improved with heat and stretching.  Plan: 1. Demonstrated OMM technique with suboccipital release (consent obtained, performed with good results), advised may replicate as needed over course of next week 2. Continue moist heat, NSAIDs PRN, stretching

## 2014-08-29 ENCOUNTER — Encounter: Payer: Self-pay | Admitting: Pharmacist

## 2014-08-29 ENCOUNTER — Ambulatory Visit (INDEPENDENT_AMBULATORY_CARE_PROVIDER_SITE_OTHER): Payer: 59 | Admitting: Pharmacist

## 2014-08-29 VITALS — BP 147/96 | HR 87 | Wt 168.0 lb

## 2014-08-29 DIAGNOSIS — Z716 Tobacco abuse counseling: Secondary | ICD-10-CM

## 2014-08-29 DIAGNOSIS — I10 Essential (primary) hypertension: Secondary | ICD-10-CM

## 2014-08-29 MED ORDER — NICOTINE POLACRILEX 4 MG MT GUM: 4.0000 mg | CHEWING_GUM | OROMUCOSAL | Status: DC | PRN

## 2014-08-29 NOTE — Patient Instructions (Signed)
Nicotine gum - use up to 5-6 pieces per day.   I have sent to your pharmacy a prescription.   Please pick up and start today or tomorrow.   Phone call follow up in 1-2 weeks.

## 2014-08-29 NOTE — Progress Notes (Signed)
S:  Patient arrives accompanied by her Grand-daughter.    Patient arrives for evaluation/assistance with tobacco dependence.   Age when started using tobacco on a daily basis ~32. Number of Cigarettes per day 6. Brand smoked Black and Mild - [place]. Estimated Nicotine Content per Cigarette (mg) 1.  Estimated Nicotine intake per day 6 mg.   Smokes first cigarette < 5 minutes after waking. Denies waking to smoke.    Estimated Fagerstrom Score 4-5/10.  Most recent quit attempt August 2015.  Longest time ever been tobacco free 1 month. What Medications (NRT, bupropion, varenicline) used in past includes none.  Rates IMPORTANCE of quitting tobacco on 1-10 scale of 10 Rates CONFIDENCE of quitting tobacco on 1-10 scale of 5. Triggers to use tobacco include; first thing in the AM when walking the dog, and after meals.      A/P: Mild Nicotine Dependence of 6 years duration in a patient who is good candidate for success b/c of previous 1 month quit in August 2015 (6 months ago), current level of motivation AND low intake of nicotine currently. Following review of multiple NRT options we initiated nicotine gum.  Patient counseled on purpose, proper use, and potential adverse effects, including nausea, and throat irritation.   Will attempt to order through prescription card and insurance.  Patient is willing to pay for tobacco cessation NRT gum if not covered.   Quit day in the next several days.  Verbalized treatment plan.  Written information provided. Provided information on 1 800-QUIT NOW support program.  F/U phone call 7-10 days.  F/U Rx Clinic Visit PRN.   Total time in face-to-face counseling 35 minutes.    Elevated BP today - reports recently stopping BP medications.   May need additional follow up and possibly restart of BP medications.

## 2014-08-29 NOTE — Assessment & Plan Note (Signed)
Elevated BP today - reports recently stopping BP medications.   May need additional follow up and possibly restart of BP medications.

## 2014-08-29 NOTE — Assessment & Plan Note (Addendum)
Mild Nicotine Dependence of 6 years duration in a patient who is good candidate for success b/c of previous 1 month quit in August 2015 (6 months ago), current level of motivation AND low intake of nicotine currently. Following review of multiple NRT options we initiated nicotine gum.  Patient counseled on purpose, proper use, and potential adverse effects, including nausea, and throat irritation.   Will attempt to order through prescription card and insurance.  Patient is willing to pay for tobacco cessation NRT gum if not covered.   Quit day in the next several days.  Verbalized treatment plan.  Written information provided. Provided information on 1 800-QUIT NOW support program.  F/U phone call 7-10 days  (# [phone] after 5:30.  F/U Rx Clinic Visit PRN.   Total time in face-to-face counseling 35 minutes.    Elevated BP today - reports recently stopping BP medications.   May need additional follow up and possibly restart of BP medications.

## 2014-08-30 NOTE — Progress Notes (Signed)
Patient ID: Jessica Hale, female   DOB: Apr 18, 1976, 39 y.o.   MRN: [id number] Reviewed: Agree with the documentation and management of Dr. Raymondo BandKoval.

## 2014-09-02 ENCOUNTER — Telehealth: Payer: Self-pay | Admitting: Pharmacist

## 2014-09-03 NOTE — Telephone Encounter (Signed)
error 

## 2014-09-19 ENCOUNTER — Other Ambulatory Visit: Payer: Self-pay | Admitting: *Deleted

## 2014-09-19 DIAGNOSIS — G4700 Insomnia, unspecified: Secondary | ICD-10-CM

## 2014-09-19 MED ORDER — ZOLPIDEM TARTRATE 5 MG PO TABS: 5.0000 mg | ORAL_TABLET | Freq: Every evening | ORAL | Status: DC | PRN

## 2014-10-17 ENCOUNTER — Other Ambulatory Visit: Payer: Self-pay | Admitting: Family Medicine

## 2014-10-17 DIAGNOSIS — J4540 Moderate persistent asthma, uncomplicated: Secondary | ICD-10-CM

## 2014-10-20 NOTE — Telephone Encounter (Signed)
Refilled Qvar. Refused refill for Nortriptyline given that this med was discontinued 03/2014 due to multiple suspected side-effects. Will review with patient at next office visit.  Saralyn PilarAlexander Karamalegos, DO [facility], PGY-2

## 2014-10-29 ENCOUNTER — Ambulatory Visit (INDEPENDENT_AMBULATORY_CARE_PROVIDER_SITE_OTHER): Payer: 59 | Admitting: Family Medicine

## 2014-10-29 ENCOUNTER — Encounter: Payer: Self-pay | Admitting: Family Medicine

## 2014-10-29 VITALS — BP 144/83 | HR 82 | Temp 98.7°F | Wt 171.0 lb

## 2014-10-29 DIAGNOSIS — J209 Acute bronchitis, unspecified: Secondary | ICD-10-CM

## 2014-10-29 DIAGNOSIS — J069 Acute upper respiratory infection, unspecified: Secondary | ICD-10-CM

## 2014-10-29 NOTE — Progress Notes (Signed)
   Subjective:    Patient ID: Jessica Hale, female    DOB: [birth year], 39 y.o.   MRN: [id number]  Seen for Same day visit for   CC:  URI  URI  Has been sick for 5 days. Nasal discharge: yes Medications tried: OTC  decongestions Sick contacts:  none  Symptoms Fever:  denies Headache or face pain:  no Tooth pain:  no Sneezing:  no Scratchy throat:  DS Allergies:  History of allergies and asthma, currently not on medications Muscle aches:  no Severe fatigue: No Stiff neck:  no Shortness of breath:  no Rash:  no Sore throat or swollen glands:  yes   She reports symptoms began Friday with nasal congestion, runny nose.  She was using over-the-counter nasal decongestant sprays with minimal relief.  Symptoms continue, progressed to sore throat and coughing with some chest discomfort.  She reports history of asthma and allergies with albuterol nebulizations at home she has not used in over a year.  She continues to smoke approximate half a pack per day.  She reports getting her flu shot this year.   ROS see HPI Smoking Status noted  Objective:  BP 144/83 mmHg  Pulse 82  Temp(Src) 98.7 F (37.1 C) (Oral)  Wt 171 lb (77.565 kg)  General: NAD HEENT:  Swollen erythematous nasal turbinates bilaterally;  Pharyngeal erythema with No exudates;  No cervical adenopathy or thyroid enlargement Cardiac: RRR, normal heart sounds, no murmurs. Respiratory: CTAB, normal effort    Assessment & Plan:  See Problem List Documentation

## 2014-10-29 NOTE — Patient Instructions (Signed)
Your symptoms are due to a viral illness. Antibiotics will not help improve your symptoms, but the following will help you feel better while your body fights the virus.   Drink lots of water (Guaifenesin "Mucinex")  Nasal Saline Spray  Congestion:   Nose spray: Afrin (Phenylephrine). DO NOT USE MORE THAN 3 DAYS  Oral: Pseudoephedrine  Sneezing & Runny nose: Antihistamines: Zyrtec, Claritin, Allegra  Pain/Sore throat: Tylenol, Ibuprofen  Cough: Dextromethorphan & Albuterol - 2 puffs every 6 hours as needed  Wash your hands often to prevent spreading the virus

## 2014-11-07 ENCOUNTER — Telehealth: Payer: Self-pay | Admitting: Family Medicine

## 2014-11-07 DIAGNOSIS — J4540 Moderate persistent asthma, uncomplicated: Secondary | ICD-10-CM

## 2014-11-07 MED ORDER — ALBUTEROL SULFATE (2.5 MG/3ML) 0.083% IN NEBU: 2.5000 mg | INHALATION_SOLUTION | Freq: Four times a day (QID) | RESPIRATORY_TRACT | Status: DC | PRN

## 2014-11-07 MED ORDER — BUDESONIDE 1 MG/2ML IN SUSP: 1.0000 mg | Freq: Every day | RESPIRATORY_TRACT | Status: DC

## 2014-11-07 NOTE — Telephone Encounter (Signed)
Returned call to patient, spoke to [place]. She reports last seen in clinic for URI with asthma flare. She has not been taking Qvar regularly, but was advised to try nebulized steroid since it had previously helped. Also requested refill on Albuterol nebulizer. Both refills sent to [facility] pharmacy. Advised her to hold Qvar and start Pulmicort daily neb for few weeks to month, then may switch back to Qvar once controlled.  Advised her to RTC for re-evaluation of her asthma and breathing status if worsening, but overall sounds like she is improving and needed to have medicine on hand if needed and possible worsening again.  Saralyn PilarAlexander Karamalegos, DO [facility], PGY-2

## 2014-11-07 NOTE — Telephone Encounter (Signed)
Ms. Jessica Hale called back to say she's in need of medication and want to have them sent to pharmacy before clinic closing for the weekend.

## 2014-11-07 NOTE — Telephone Encounter (Signed)
Needs refill on albeutrol and the steriod for the nebulizer

## 2014-12-01 ENCOUNTER — Ambulatory Visit (INDEPENDENT_AMBULATORY_CARE_PROVIDER_SITE_OTHER): Payer: 59 | Admitting: Family Medicine

## 2014-12-01 ENCOUNTER — Encounter: Payer: Self-pay | Admitting: Family Medicine

## 2014-12-01 VITALS — BP 141/85 | HR 69 | Temp 98.3°F | Wt 166.0 lb

## 2014-12-01 DIAGNOSIS — J069 Acute upper respiratory infection, unspecified: Secondary | ICD-10-CM | POA: Diagnosis not present

## 2014-12-01 MED ORDER — CETIRIZINE HCL 10 MG PO TABS: 10.0000 mg | ORAL_TABLET | Freq: Every day | ORAL | Status: DC

## 2014-12-01 NOTE — Patient Instructions (Addendum)
This might be allergies or a virus. I don't think you need antibiotics Start zyrtec 10mg  daily Add nasal saline spray  Follow up with Dr. Althea CharonKaramalegos in a few weeks for your blood pressure and asthma  Be well, Dr. Pollie MeyerMcIntyre

## 2014-12-01 NOTE — Progress Notes (Signed)
Patient ID: Jessica Hale, female   DOB: 1976/03/16, 39 y.o.   MRN: [id number]  HPI:  Pt presents for a same day appointment to discuss URI sx's.  Patient has a history of asthma and uses Qvar once a day. She's been following up with her PCP Dr. Althea CharonKaramalegos for this recently. She reports that for several days she has had a cough productive of green mucus. Also feels like the inside of her nose is red. Notices a large left-sided lymph node that is tender. Has felt fatigued easily. Has not required her albuterol much, used it about once in the last week. This flare began one week ago. Has intermittently got better and worse. Not hearing herself wheeze. No fever. Normal PO intake.   ROS: See HPI  PMFSH: History of asthma, hypertension, GERD, Mnire's disease  PHYSICAL EXAM: BP 141/85 mmHg  Pulse 69  Temp(Src) 98.3 F (36.8 C) (Oral)  Wt 166 lb (75.297 kg)  SpO2 100% Gen: No acute distress, pleasant, cooperative HEENT: Normocephalic, atraumatic, TMs clear bilaterally, nares with some erythema and irritation with clear discharge, left  anterior cervical lymphadenopathy approximately 2 cm in size Heart: Regular rate and rhythm, no murmur Lungs: Clear to auscultation bilaterally, normal respiratory effort Abdomen: Soft, nontender to palpation Neuro: Grossly nonfocal, speech normal  ASSESSMENT/PLAN:  1. URI: Appears to have viral trigger. May also have some allergic component. No signs of asthma flare at this time. Patient is not wheezing and has normal pulse ox. Recommend supportive care. Add nasal saline spray. I will also start her on empiric Zyrtec for allergies. F/u with PCP in a few weeks for chronic asthma control. Discussed that left anterior cervical lymph node is most likely reactive from infection and should clear with time. Counseled her to return if it does not improve after infection resolves.  FOLLOW UP: F/u in a few weeks with PCP for HTN & asthma  [place]ittany J. Pollie MeyerMcIntyre,  MD [facility]

## 2014-12-16 ENCOUNTER — Other Ambulatory Visit: Payer: Self-pay | Admitting: *Deleted

## 2014-12-16 DIAGNOSIS — G4700 Insomnia, unspecified: Secondary | ICD-10-CM

## 2014-12-18 MED ORDER — ZOLPIDEM TARTRATE 5 MG PO TABS: 5.0000 mg | ORAL_TABLET | Freq: Every evening | ORAL | Status: DC | PRN

## 2014-12-31 ENCOUNTER — Encounter: Payer: Self-pay | Admitting: Family Medicine

## 2014-12-31 ENCOUNTER — Ambulatory Visit (INDEPENDENT_AMBULATORY_CARE_PROVIDER_SITE_OTHER): Payer: 59 | Admitting: Family Medicine

## 2014-12-31 VITALS — BP 140/93 | HR 74 | Temp 98.3°F | Ht 62.0 in | Wt 168.7 lb

## 2014-12-31 DIAGNOSIS — I10 Essential (primary) hypertension: Secondary | ICD-10-CM

## 2014-12-31 DIAGNOSIS — K219 Gastro-esophageal reflux disease without esophagitis: Secondary | ICD-10-CM

## 2014-12-31 DIAGNOSIS — K625 Hemorrhage of anus and rectum: Secondary | ICD-10-CM | POA: Diagnosis not present

## 2014-12-31 DIAGNOSIS — G43009 Migraine without aura, not intractable, without status migrainosus: Secondary | ICD-10-CM

## 2014-12-31 DIAGNOSIS — G43809 Other migraine, not intractable, without status migrainosus: Secondary | ICD-10-CM

## 2014-12-31 DIAGNOSIS — K649 Unspecified hemorrhoids: Secondary | ICD-10-CM

## 2014-12-31 MED ORDER — OMEPRAZOLE 20 MG PO CPDR: 20.0000 mg | DELAYED_RELEASE_CAPSULE | Freq: Every day | ORAL | Status: DC

## 2014-12-31 MED ORDER — METOPROLOL TARTRATE 25 MG PO TABS: 12.5000 mg | ORAL_TABLET | Freq: Two times a day (BID) | ORAL | Status: DC

## 2014-12-31 MED ORDER — HYDROCHLOROTHIAZIDE 25 MG PO TABS: 12.5000 mg | ORAL_TABLET | Freq: Every day | ORAL | Status: DC

## 2014-12-31 NOTE — Progress Notes (Signed)
   Subjective:    Patient ID: Jessica Hale, female    DOB: 06-10-1976, 39 y.o.   MRN: [id number]  HPI  CHRONIC HTN: Reports - last visit 07/2014 she had self discontinued all anti-HTN meds (metoprolol and Amlodpine), last office BP checks 140s/80s, no recent home BP checks. Current Meds - none - Continues to smoke, consumes caffeine (significantly reduced, 2 cups coffee on weekends only) - Admits some recurrent episodes of palpitations (similar to previous, not as severe) Denies CP, dyspnea, edema, dizziness / lightheadedness  CHRONIC MIGRAINE HA: - Reports headaches daily for past 2 months, most but not all days, she started resuming her Metoprolol half tab BID, since starting thinks that HA is feeling better. Location bilateral frontal and behind eyes, aching, seems to get worse at end of day. - Taking Excredrin / tension headache about 1-2x daily if HA, with some decent relief. Not taking Tylenol or other NSAIDs - Previously drinking cup coffee daily, now drinking 2 cups weekly - Continues to smoke - Denies any dizziness / vertigo, lightheadedness, loss of vision / changes, numbness, weakness or tingling, nausea / vomiting  GERD - Chronic history >5-10 years - Reports significant worsening within past 1 year, that she gets heartburn soon after eating, almost after every meal, regardless of diet. Only thing that seems to provide relief is spoonful of baking soda. Previously has tried Scientist, research (medical). Worse at night and laying flat, tries not to eat late, does feel some reflux in throat, occasional throat clearing. - Tries to avoid spicy, fried, fatty foods - some improvement but still symptoms present. - Associated with nausea (w/o vomiting) - Denies any active abd pain, chronic cough  BRIGHT RED BLOOD PER RECTUM: - No prior history. 2x episodes in past month. First episode thought may be on her period when wiped and noticed "decent amount of red blood on toilet paper" also saw "some blood mixed  with stool in toilet bowel", but seemed to continue on next BM later that day, then it cleared up and resolved. Until second episode about 1 week ago she "thinks" that it was there. - No family history of colon CA or IBD - Currently reports no further episodes of bleeding. Does admit to known history of hemorrhoids frequently present when straining with BMs (chronic history of constipation), no significant bleeding episodes in past from hemorrhoids. BMs are non-painful - Denies dark tarry stools, active hematochezia, hematemesis  I have reviewed and updated the following as appropriate: allergies and current medications  Social Hx: - Continues to smoke, self discontinued nicotine gum  Review of Systems  See above HPI    Objective:   Physical Exam  BP 140/93 mmHg  Pulse 74  Temp(Src) 98.3 F (36.8 C) (Oral)  Ht 5\' 2"  (1.575 m)  Wt 168 lb 11.2 oz (76.522 kg)  BMI 30.85 kg/m2  LMP 12/31/2014  Gen - well-appearing, comfortable, NAD HEENT - NCAT, oropharynx clear, MMM Neck - supple, non-tender Heart - RRR, no murmurs heard Abd - soft, NTND, no guarding, +active BS Ext - non-tender, no edema b/l, peripheral pulses intact +2 b/l dp Skin - warm, dry, no rashes Neuro - awake, alert, oriented, grossly non-focal     Assessment & Plan:   See specific A&P problem list for details.

## 2014-12-31 NOTE — Patient Instructions (Signed)
Dear Jessica Hale, Thank you for coming in to clinic today. It was good to see you!  1. For your GERD (Acid Reflux) - Start Omeprazole 20mg  daily (30 min before breakfast) every day for 1 month, if not responding after 2 weeks, then increase to 2 capsules or 40mg  total daily for remaining 2 weeks. - Keep track of your dietary changes, avoid trigger foods, if you can elevate head of bed or don't lay down right after eating - Can take TUMS as needed as well on occasion 2. For your Blood In Stool - This is by far most likely due to hemorrhoids. Reassurance and follow this in future, let me know if this becomes worse. 3. For Elevated BP - Continue Metoprolol and add HCTZ 12.5mg  (half 25mg  tab) daily 4. For HAs - Recommend STOP Excedrin, this can cause rebound headaches, especially with caffeine / nicotine in your system. Recommend just extra str tylenol 1-2 tabs every 6 hours as needed (try not to take every day as well, this can eventually cause rebound as well, but not as severe) - Keep Headache Calendar bring to next visit  BP - 140/93 Results -  If you are not improving we will refer you to a GI specialist in the future.  Please schedule a follow-up appointment with Dr. Althea Charon in 1 month to follow-up Acid Reflux, BP, Headaches  If you have any other questions or concerns, please feel free to call the clinic to contact me. You may also schedule an earlier appointment if necessary.  However, if your symptoms get significantly worse, please go to the Emergency Department to seek immediate medical attention.  Saralyn Pilar, DO [facility]

## 2015-01-01 DIAGNOSIS — K649 Unspecified hemorrhoids: Secondary | ICD-10-CM | POA: Insufficient documentation

## 2015-01-01 DIAGNOSIS — K625 Hemorrhage of anus and rectum: Secondary | ICD-10-CM | POA: Insufficient documentation

## 2015-01-01 NOTE — Assessment & Plan Note (Signed)
Recent worsening / flare of chronic GERD, previously controlled with dietary mods and OTC antacids. Previously on PPI for brief course. - no red flags, does have BRBPR (likely hemorrhoids) no evidence of UGI bleed, no regular NSAIDs (on excedrin)  Plan: 1. Start empiric PPI with omeprazole 20mg  daily (30 min prior to food) x 2 weeks, if not significantly improved can increase to 2 capules (40mg ) daily for 2 weeks 2. Continue dietary mods - avoid trigger foods 3. May use antacids PRN 4. Elevate head of bed 5. RTC 1 month re-eval, consider GI referral in future

## 2015-01-01 NOTE — Assessment & Plan Note (Signed)
Resolved, no further active bleeding, x 1 significant episode recently, not described as hematochezia, seems more like bleeding from known hemorrhoids. Denies melena. +hard infrequent BMs, known history of constipation  Plan: 1. Reassurance, seems to be lower source from hemorrhoids, no other GI red flags (no n/v, abd pain, wt loss, diarrhea, melena, persistent bleeding) 2. Resume home constipation regimen PRN 3. Monitor, follow-up if persistent recurrence

## 2015-01-01 NOTE — Assessment & Plan Note (Signed)
Recurrence with some recent worsening migraine HAs x 2 months. Previously controlled (on variety of meds including nortriptyline, metoprolol prophylaxis), however unable to tolerate nortriptyline d/t side-effects/intolerance - Suspect multifactorial headaches with tension and possible rebound (chronic excedrin migraine use, previously daily caffeine now tapered, still smoking +nicotine) - No complications, improves with rest / analgesia  Plan: 1. Continue metoprolol, may help both BP and migraine prophylaxis 2. Request headache journal for next visit 3. Discontinue Excedrin migraine, may take Tylenol PRN HA (avoid daily use) - reduce risk of rebound (HA likely get worst first) 4. RTC 1 month, re-eval, consider Effexor, future referral to [facility] Clinic

## 2015-01-01 NOTE — Assessment & Plan Note (Signed)
Remains elevated BP today, persistently >140/80s over past few visits after self discontinuing anti-HTN meds (metop, amlodipine, previously on HCTZ)  Plan: 1. Restart HCTZ 12.5mg  daily 2. Continue Metoprolol 12.5mg  BID - sent refill 3. Not due for annual BMET - previously SCr stable, follow-up within 3-6 months 4. Smoking cessation provided today 5. Continue reduced caffeine use 6. Reduce salt intake, increase K / fresh vegetables 7. RTC 1 month re-check BP

## 2015-03-03 ENCOUNTER — Other Ambulatory Visit: Payer: Self-pay | Admitting: Family Medicine

## 2015-03-03 DIAGNOSIS — K219 Gastro-esophageal reflux disease without esophagitis: Secondary | ICD-10-CM

## 2015-03-16 ENCOUNTER — Other Ambulatory Visit: Payer: Self-pay | Admitting: Family Medicine

## 2015-03-16 DIAGNOSIS — G4700 Insomnia, unspecified: Secondary | ICD-10-CM

## 2015-03-19 ENCOUNTER — Other Ambulatory Visit: Payer: Self-pay | Admitting: Family Medicine

## 2015-03-19 NOTE — Telephone Encounter (Signed)
Phoned in refill for Zolpidem  PO QHS PRN sleep, insomnia #30, +5 refills.  Saralyn Pilar, DO [facility], PGY-3

## 2015-03-20 ENCOUNTER — Encounter: Payer: Self-pay | Admitting: Internal Medicine

## 2015-03-20 ENCOUNTER — Ambulatory Visit (INDEPENDENT_AMBULATORY_CARE_PROVIDER_SITE_OTHER): Payer: 59 | Admitting: Internal Medicine

## 2015-03-20 VITALS — BP 138/89 | HR 84 | Temp 98.2°F | Ht 62.0 in | Wt 168.0 lb

## 2015-03-20 DIAGNOSIS — N63 Unspecified lump in unspecified breast: Secondary | ICD-10-CM

## 2015-03-20 NOTE — Patient Instructions (Signed)
Thank you for coming in. Likely what you are feeling are some normal fibrocystic changes in your breast. However, as discussed, about a week after your menstrual cycle please check your breast for this lump if still present, please follow up otherwise no further need.  Breast Self-Awareness Breast self-awareness allows you to notice a breast problem early while it is still small. Do a breast self-exam:  Every month, 5-7 days after your period (menstrual period).  At the same time each month if you do not have periods anymore. Look for any:  Difference between your breasts (size, shape, or position).  Change in breast shape or size.  Fluid or blood coming from your nipples.  Changes in your nipples (dimpling, nipple movement).    Change in skin color or texture (redness, scaly areas). Feel for:  Lumps.  Bumps.  Dips.  Any other changes. HOW TO DO A BREAST SELF-EXAM Look at your breasts and nipples. 1. Take off all your clothes above your waist. 2. Stand in front of a mirror in a room with good lighting. 3. Put your hands on your hips and push your hands downward. Feel your breasts.  1. Lie flat on your back or stand in the shower or tub. If you are in the shower or tub, have wet, soapy hands. 2. Place your right arm above your head. 3. Place your left hand in the right underarm area. 4. Make small circles using the pads (not the fingertips) of your 3 middle fingers. Press lightly and then with medium and firm pressure. 5. Move your fingers a little lower and make the small circles at the 3 pressures (light, medium, and firm). 6. Continue moving your fingers lower and making circles until you reach the bottom of your breast. 7. Move your fingers one finger-width towards the center of the body. 8. Continue making the circles, this time moving upward until you reach the bottom of your neck. 9. Move your fingers one finger-width towards the center of your body. 10. Make circles  downward when starting at the bottom of the neck. Make circles upward when starting at the bottom of the breast. Stop when you reach the middle of the chest. 11.  Repeat these steps on the other breast. Write down what looks and feels normal for each breast. Also write down any changes you notice. GET HELP RIGHT AWAY IF:  You see any changes in your breasts or nipples.  You see skin changes.  You have unusual discharge from your nipples.  You feel a new lump.  You feel unusually thick areas. Document Released: 12/21/2007 Document Revised: 06/20/2012 Document Reviewed: 10/19/2011 [facility] Patient Information 2015 [place], [place]. This information is not intended to replace advice given to you by your health care provider. Make sure you discuss any questions you have with your health care provider.

## 2015-03-20 NOTE — Progress Notes (Signed)
Patient ID: Jessica Hale, female   DOB: 07-Oct-1975, 39 y.o.   MRN: [id number]   [facility] Noralee Chars, MD Phone: [phone]  Subjective:   # Breast Lump- Noticed breast lump last week when doing a self-breast exam. She states that her wife also noticed the lump at the time.  Since then have not noticed any change in size of lump. She states she is about to get her menstrual cycle. States that sometimes she has slight pain in her breast associated with menstrual cycle. She denies any erythema of her breast, or nipple discharge. She is not taking a hormone supplements or birth control. Patient has no family hx of breast cancer, potentially some great aunts on her grandfathers side. Patient has never had any surgery on breast. She has no previous hx of breast mass or breast cancer. No hx of recent breast trauma. Patient had menarche at age 66 and has not had any pregnancies.   All relevant systems were reviewed and were negative unless otherwise noted in the HPI  Past Medical History Reviewed problem list.  Medications- reviewed and updated Current Outpatient Prescriptions  Medication Sig Dispense Refill  . albuterol (PROVENTIL HFA;VENTOLIN HFA) 108 (90 BASE) MCG/ACT inhaler Inhale 2 puffs into the lungs every 6 (six) hours as needed for wheezing. (Patient not taking: Reported on 08/01/2014) 1 Inhaler 1  . albuterol (PROVENTIL) (2.5 MG/3ML) 0.083% nebulizer solution Take 3 mLs (2.5 mg total) by nebulization every 6 (six) hours as needed for wheezing. (Patient not taking: Reported on 12/31/2014) 75 mL 5  . budesonide (PULMICORT) 1 MG/2ML nebulizer solution Take 2 mLs (1 mg total) by nebulization daily. (Patient not taking: Reported on 12/31/2014) 60 mL 2  . cetirizine (ZYRTEC) 10 MG tablet Take 1 tablet (10 mg total) by mouth daily. (Patient not taking: Reported on 12/31/2014) 30 tablet 2  . cyclobenzaprine (FLEXERIL) 5 MG tablet Take 5 mg by mouth 3 (three) times daily as  needed for muscle spasms.    Marland Kitchen EPIPEN 2-PAK 0.3 MG/0.3ML SOAJ injection inject as directed WITH BEE STING  0  . glycerin adult (GLYCERIN ADULT) 2 G SUPP Place 1 suppository rectally once. (Patient not taking: Reported on 08/29/2014) 10 suppository 2  . hydrochlorothiazide (HYDRODIURIL) 25 MG tablet Take 0.5 tablets (12.5 mg total) by mouth daily. 30 tablet 2  . lactulose (CEPHULAC) 10 G packet Take 1 packet (10 g total) by mouth daily. (Patient not taking: Reported on 08/29/2014) 30 each 0  . metoprolol tartrate (LOPRESSOR) 25 MG tablet Take 0.5 tablets (12.5 mg total) by mouth 2 (two) times daily. 60 tablet 2  . omeprazole (PRILOSEC) 20 MG capsule TAKE ONE CAPSULE BY MOUTH ONCE DAILY 30 capsule 5  . polyethylene glycol powder (GLYCOLAX/MIRALAX) powder Take 17 g by mouth 2 (two) times daily as needed for moderate constipation or severe constipation. (Patient not taking: Reported on 08/29/2014) 3350 g 2  . QVAR 40 MCG/ACT inhaler INHALE TWO PUFFS BY MOUTH TWICE DAILY 1 Inhaler 11  . zolpidem (AMBIEN) 5 MG tablet TAKE ONE TABLET BY MOUTH AT BEDTIME AS NEEDED FOR SLEEP 30 tablet 5   No current facility-administered medications for this visit.   Chief complaint-noted No additions to family history Social history- No family hx of breast cancer   Objective: BP 138/89 mmHg  Pulse 84  Temp(Src) 98.2 F (36.8 C) (Oral)  Ht  (1.575 m)  Wt 168 lb (76.204 kg)  BMI 30.72 kg/m2  LMP 02/18/2015 (Exact  Date) Gen: NAD, alert, cooperative with exam Breast Exam:  No visual lumps, discoloration,  swelling, or nipple discharge.  No retraction of breast tissue or nipple. No lymphadenopathy in palpating the axillary nodes bilaterally. Left breast with fibrous diffusely  nodular mobile undefinable mass. Right breast with some slight nodular tissue as well.  Neck: FROM, supple, No lymphadenopathy  Skin: no rashes no lesions  Assessment/Plan:  Breast mass in female Left Breast Mass- Noticed mass about a  week before menstrual cycle. No family hx of breast cancer, menarche at 46. No hx of pregnancy. Physical exam, no definable mass found on breast exam, breast tissue diffusely nodular on the upper outer left quadrant. Patient states slightly tenderness. Patient likely with fibrocystic changes, no hard or immobile mass noted  - If patient still feels concerning lump a week after menstrual cycle advised patient to return  - Next step would include an ultrasound with possible FNA to determine if mass is a Cyst.

## 2015-03-20 NOTE — Assessment & Plan Note (Signed)
Left Breast Mass- Noticed mass about a week before menstrual cycle. No family hx of breast cancer, menarche at 81. No hx of pregnancy. Physical exam, no definable mass found on breast exam, breast tissue diffusely nodular on the upper outer left quadrant. Patient states slightly tenderness. Patient likely with fibrocystic changes, no hard or immobile mass noted  - If patient still feels concerning lump a week after menstrual cycle advised patient to return  - Next step would include an ultrasound with possible FNA to determine if mass is a Cyst.

## 2015-06-16 ENCOUNTER — Other Ambulatory Visit: Payer: Self-pay | Admitting: Family Medicine

## 2015-06-16 DIAGNOSIS — I10 Essential (primary) hypertension: Secondary | ICD-10-CM

## 2015-08-11 ENCOUNTER — Telehealth: Payer: Self-pay | Admitting: *Deleted

## 2015-08-11 NOTE — Telephone Encounter (Signed)
Called patient to offer flu vaccine. Patient states that she needs to schedule full physical and will ask for flu shot at that time. Offered to schedule appt, however, patient did not have schedule with her. Explained importance of receiving flu vaccine and length of season. States she will call back to schedule soon. Fredderick Severance, RN

## 2015-08-18 ENCOUNTER — Ambulatory Visit: Payer: 59

## 2015-08-19 ENCOUNTER — Ambulatory Visit (INDEPENDENT_AMBULATORY_CARE_PROVIDER_SITE_OTHER): Payer: Commercial Managed Care - HMO | Admitting: *Deleted

## 2015-08-19 DIAGNOSIS — Z23 Encounter for immunization: Secondary | ICD-10-CM | POA: Diagnosis not present

## 2015-09-08 ENCOUNTER — Encounter: Payer: 59 | Admitting: Family Medicine

## 2015-09-14 ENCOUNTER — Other Ambulatory Visit: Payer: Self-pay | Admitting: Family Medicine

## 2015-09-14 DIAGNOSIS — K219 Gastro-esophageal reflux disease without esophagitis: Secondary | ICD-10-CM

## 2015-09-17 ENCOUNTER — Ambulatory Visit (INDEPENDENT_AMBULATORY_CARE_PROVIDER_SITE_OTHER): Payer: Commercial Managed Care - HMO | Admitting: Family Medicine

## 2015-09-17 ENCOUNTER — Encounter: Payer: Self-pay | Admitting: Family Medicine

## 2015-09-17 VITALS — BP 142/86 | HR 77 | Temp 98.1°F | Ht 62.0 in | Wt 173.9 lb

## 2015-09-17 DIAGNOSIS — R05 Cough: Secondary | ICD-10-CM

## 2015-09-17 DIAGNOSIS — K219 Gastro-esophageal reflux disease without esophagitis: Secondary | ICD-10-CM

## 2015-09-17 DIAGNOSIS — J4520 Mild intermittent asthma, uncomplicated: Secondary | ICD-10-CM | POA: Diagnosis not present

## 2015-09-17 DIAGNOSIS — I10 Essential (primary) hypertension: Secondary | ICD-10-CM | POA: Diagnosis not present

## 2015-09-17 DIAGNOSIS — R059 Cough, unspecified: Secondary | ICD-10-CM

## 2015-09-17 DIAGNOSIS — J4540 Moderate persistent asthma, uncomplicated: Secondary | ICD-10-CM

## 2015-09-17 DIAGNOSIS — Z0000 Encounter for general adult medical examination without abnormal findings: Secondary | ICD-10-CM | POA: Diagnosis not present

## 2015-09-17 DIAGNOSIS — Z716 Tobacco abuse counseling: Secondary | ICD-10-CM

## 2015-09-17 DIAGNOSIS — G4700 Insomnia, unspecified: Secondary | ICD-10-CM

## 2015-09-17 DIAGNOSIS — E669 Obesity, unspecified: Secondary | ICD-10-CM

## 2015-09-17 LAB — COMPLETE METABOLIC PANEL WITH GFR
ALBUMIN: 4.3 g/dL (ref 3.6–5.1)
ALK PHOS: 46 U/L (ref 33–115)
ALT: 17 U/L (ref 6–29)
AST: 19 U/L (ref 10–30)
BILIRUBIN TOTAL: 0.3 mg/dL (ref 0.2–1.2)
BUN: 11 mg/dL (ref 7–25)
CO2: 25 mmol/L (ref 20–31)
CREATININE: 0.96 mg/dL (ref 0.50–1.10)
Calcium: 9.5 mg/dL (ref 8.6–10.2)
Chloride: 104 mmol/L (ref 98–110)
GFR, EST AFRICAN AMERICAN: 87 mL/min (ref >=60)
GFR, Est Non African American: 76 mL/min (ref >=60)
Glucose, Bld: 81 mg/dL (ref 65–99)
Potassium: 4.3 mmol/L (ref 3.5–5.3)
Sodium: 137 mmol/L (ref 135–146)
TOTAL PROTEIN: 7.4 g/dL (ref 6.1–8.1)

## 2015-09-17 LAB — LIPID PANEL
CHOLESTEROL: 177 mg/dL (ref 125–200)
HDL: 51 mg/dL (ref >=46)
LDL Cholesterol: 102 mg/dL (ref <130)
TRIGLYCERIDES: 122 mg/dL (ref <150)
Total CHOL/HDL Ratio: 3.5 Ratio (ref <=5.0)
VLDL: 24 mg/dL (ref <30)

## 2015-09-17 MED ORDER — BECLOMETHASONE DIPROPIONATE 40 MCG/ACT IN AERS: 2.0000 | INHALATION_SPRAY | Freq: Two times a day (BID) | RESPIRATORY_TRACT | Status: DC

## 2015-09-17 MED ORDER — METOPROLOL TARTRATE 25 MG PO TABS: 12.5000 mg | ORAL_TABLET | Freq: Two times a day (BID) | ORAL | Status: DC

## 2015-09-17 MED ORDER — CYCLOBENZAPRINE HCL 5 MG PO TABS: 5.0000 mg | ORAL_TABLET | Freq: Three times a day (TID) | ORAL | Status: DC | PRN

## 2015-09-17 MED ORDER — OMEPRAZOLE 20 MG PO CPDR: 20.0000 mg | DELAYED_RELEASE_CAPSULE | Freq: Every day | ORAL | Status: DC

## 2015-09-17 MED ORDER — ALBUTEROL SULFATE HFA 108 (90 BASE) MCG/ACT IN AERS: 2.0000 | INHALATION_SPRAY | Freq: Four times a day (QID) | RESPIRATORY_TRACT | Status: DC | PRN

## 2015-09-17 MED ORDER — HYDROCHLOROTHIAZIDE 25 MG PO TABS: 12.5000 mg | ORAL_TABLET | Freq: Every day | ORAL | Status: DC

## 2015-09-17 MED ORDER — ZOLPIDEM TARTRATE 5 MG PO TABS: 5.0000 mg | ORAL_TABLET | Freq: Every evening | ORAL | Status: DC | PRN

## 2015-09-17 NOTE — Assessment & Plan Note (Signed)
Improved control on PPI - Refilled Omeprazole

## 2015-09-17 NOTE — Assessment & Plan Note (Signed)
Acute on chronic insomnia recent worsening with life stressors. Also component of shift work sleep disorder likely. - Refill Ambien - Future consider trial SSRI or other medications if anxiety component

## 2015-09-17 NOTE — Assessment & Plan Note (Signed)
Slightly elevated BP again today, manual re-check mild improvement still SBP >140, however did not take anti-HTN meds today, recent stress  Plan: 1. Refilled HCTZ, Metoprolol today 2. Check CMET 3. Lifestyle modifications, smoking cessation provided, reduce salt / caffeine, start regular exercise 4. Check BP at home 5. RTC within 6 months HTN follow-up

## 2015-09-17 NOTE — Progress Notes (Signed)
Subjective:    Patient ID: Jessica Hale, female    DOB: [birth year], 40 y.o.   MRN: [id number]  Presents for Annual Physical  HPI  Health Maintenance: - No regular physical exercise, stays active at work - Last screen DM with A1c 5.4 in 02/2014. Due for repeat with family history DM2. Weight has remained mostly stable with some fluctuations, recently +5 lb in past 9 months - UTD on flu shot, immunizations - Not due for mammogram screening, until age 14+, no family history of Breast Cancer - Last pap smear 12/2013 (negative for malignancy and HPV co test negative, next due in 3-5 years, 2018-2020)  CHRONIC HTN Reports no concerns with BP. Does occasionally miss BP med doses. Wife checks BP at home occasionally, cannot recall numbers. 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 Controlled on infrequent Ambien. Now recently having some difficulties falling asleep with recent stress with passing of Aunt. - Request refill Ambien  GERD - Chronic history GERD. Symptoms mainly with heartburn, seems controlled on Omeprazole  daily. Requests refill.  TOBACCO ABUSE: - Resumed smoking after quit off and on for several years. Started again within past 3 months, recent significant life stressors with sick family member, who passed recently. - Smokes new port cigs, non-menthol about 7 daily - Not interested in quitting at this time  PMH Asthma - requests refills on inhalers  Social History  Substance Use Topics  . Smoking status: Current Every Day Smoker -- 0.50 packs/day for 5 years    Types: Cigarettes  . Smokeless tobacco: Never Used     Comment: decreased  smoking/ uses e-cigarette  . Alcohol Use: 0.0 oz/week    0 Standard drinks or equivalent per week     Comment: occasional   Review of Systems  See above HPI  Denies any fever/chills, unintentional weight gain/loss, CP, SOB, recent illness, joint pain, abdominal pain, n/v    Objective:   Physical Exam  Constitutional: She appears well-developed and well-nourished. No distress.  Overweight, well-appearing, comfortable, cooperative  HENT:  Head: Normocephalic and atraumatic.  Mouth/Throat: Oropharynx is clear and moist.  Eyes: Conjunctivae are normal. Pupils are equal, round, and reactive to light.  Neck: Normal range of motion. Neck supple. No thyromegaly present.  Cardiovascular: Normal rate, regular rhythm, normal heart sounds and intact distal pulses.   No murmur heard. Pulmonary/Chest: Effort normal and breath sounds normal. No respiratory distress. She has no wheezes. She has no rales.  Abdominal: Soft. Bowel sounds are normal. She exhibits no distension and no mass. There is no tenderness.  Musculoskeletal: She exhibits no edema.  Low back: No deformity, non-tender, mild bilateral lumbar paraspinal hypertonicity, negative SLR for radiculopathy  Neurological: She is alert.  Skin: Skin is warm and dry. No rash noted. She is not diaphoretic.  Psychiatric: She has a normal mood and affect. Her behavior is normal.  Nursing note and vitals reviewed.   BP 142/86 mmHg  Pulse 77  Temp(Src) 98.1 F (36.7 C) (Oral)  Ht  (1.575 m)  Wt 173 lb 14.4 oz (78.881 kg)  BMI 31.80 kg/m2  LMP 09/15/2015     Assessment & Plan:   Problem List Items Addressed  This Visit    Asthma    Mild intermittent asthma, well controlled. - Refill Albuterol - Refill Qvar      Relevant Medications   beclomethasone (QVAR) 40 MCG/ACT inhaler   albuterol (PROVENTIL HFA;VENTOLIN HFA) 108 (90 Base) MCG/ACT inhaler   GERD    Improved control on PPI - Refilled Omeprazole      Relevant  Medications   omeprazole (PRILOSEC) 20 MG capsule   HYPERTENSION, BENIGN    Slightly elevated BP again today, manual re-check mild improvement still SBP >140, however did not take anti-HTN meds today, recent stress  Plan: 1. Refilled HCTZ, Metoprolol today 2. Check CMET 3. Lifestyle modifications, smoking cessation provided, reduce salt / caffeine, start regular exercise 4. Check BP at home 5. RTC within 6 months HTN follow-up      Relevant Medications   metoprolol tartrate (LOPRESSOR) 25 MG tablet   hydrochlorothiazide (HYDRODIURIL) 25 MG tablet   Other Relevant Orders   COMPLETE METABOLIC PANEL WITH GFR   INSOMNIA, PERSISTENT    Acute on chronic insomnia recent worsening with life stressors. Also component of shift work sleep disorder likely. - Refill Ambien - Future consider trial SSRI or other medications if anxiety component      Relevant Medications   zolpidem (AMBIEN) 5 MG tablet   Obesity    Elevated BMI 31 - Will check fasting lipid, A1c for HLD / DM screening  Update - A1c not collected in Lab today. - Will contact patient to have them schedule a Lab Only apt for A1c, future order placed      Relevant Orders   COMPLETE METABOLIC PANEL WITH GFR   Lipid panel   POCT glycosylated hemoglobin (Hb A1C)   Tobacco abuse counseling    Resumed smoking after prior quit. Mild nicotine dependence - Previously saw Dr Raymondo Band, patient has resources, not ready to quit today with recent stress, but motivated to cutback in future       Other Visit Diagnoses    Annual physical exam    -  Primary    Cough        Relevant Medications    albuterol (PROVENTIL HFA;VENTOLIN HFA) 108 (90 Base) MCG/ACT inhaler      Return in about 6 months (around 03/19/2016) for blood pressure.  Saralyn Pilar, DO [facility], PGY-3

## 2015-09-17 NOTE — Assessment & Plan Note (Signed)
Mild intermittent asthma, well controlled. - Refill Albuterol - Refill Qvar

## 2015-09-17 NOTE — Assessment & Plan Note (Addendum)
Elevated BMI 31 - Will check fasting lipid, A1c for HLD / DM screening  Update - A1c not collected in Lab today. - Will contact patient to have them schedule a Lab Only apt for A1c, future order placed

## 2015-09-17 NOTE — Assessment & Plan Note (Signed)
Resumed smoking after prior quit. Mild nicotine dependence - Previously saw Dr Raymondo Band, patient has resources, not ready to quit today with recent stress, but motivated to cutback in future

## 2015-09-17 NOTE — Patient Instructions (Signed)
Thank you for coming in to clinic today.  1. BP is slightly elevated today - Remember to take meds each day - Work on improving regular exercise - Low salt diet, reduce caffeine, alcohol - Try to quit smoking  2. Refilled meds  3. Check blood work today, will contact you within a week if needed, otherwise mail letter  Please schedule a follow-up appointment with Dr Althea Charon in 3 to 6 months for BP follow-up  If you have any other questions or concerns, please feel free to call the clinic to contact me. You may also schedule an earlier appointment if necessary.  However, if your symptoms get significantly worse, please go to the Emergency Department to seek immediate medical attention.  Saralyn Pilar, DO [facility]

## 2015-09-18 ENCOUNTER — Telehealth: Payer: Self-pay | Admitting: Family Medicine

## 2015-09-18 DIAGNOSIS — E669 Obesity, unspecified: Secondary | ICD-10-CM

## 2015-09-18 NOTE — Telephone Encounter (Signed)
Last visit 09/17/15 for physical, had CMET, fasting lipid panel, and A1c. Reviewed results. Note lab did not obtain A1c due to error. Otherwise, results for CMET unremarkable with normal Cr, LFTs, K. Fasting lipid with stable slight elevated TG 122, normal HDL 51, good LDL 102. Given risk factors for ASCVD including HTN with last BP 142 in office, Smoker, she is at increased risk ASCVD 10 yr score 5.5% (compared to baseline 0.3% at her age, this was calculated using age 40, since calculator not valid < 40 yr). No recommendation to start statin therapy.  Called patient and reviewed the above results. Additionally, advised that her fasting glucose 81 is normal, and that can be effective DM screening, additionally offered repeat A1c since it was not drawn, and she may schedule a Lab Only appointment at anytime in next 6 to 12 months for this. Future order placed.  Results for orders placed or performed in visit on 09/17/15 (from the past 24 hour(s))  COMPLETE METABOLIC PANEL WITH GFR     Status: None   Collection Time: 09/17/15 10:58 AM  Result Value Ref Range   Sodium 137 135 - 146 mmol/L   Potassium 4.3 3.5 - 5.3 mmol/L   Chloride 104 98 - 110 mmol/L   CO2 25 20 - 31 mmol/L   Glucose, Bld 81 65 - 99 mg/dL   BUN 11 7 - 25 mg/dL   Creat 0.980.96 1.190.50 - 1.471.10 mg/dL   Total Bilirubin 0.3 0.2 - 1.2 mg/dL   Alkaline Phosphatase 46 33 - 115 U/L   AST 19 10 - 30 U/L   ALT 17 6 - 29 U/L   Total Protein 7.4 6.1 - 8.1 g/dL   Albumin 4.3 3.6 - 5.1 g/dL   Calcium 9.5 8.6 - 82.910.2 mg/dL   GFR, Est African American 87 >=60 mL/min   GFR, Est Non African American 76 >=60 mL/min   Narrative   Performed at:  [facility]                [address], [address]                [place], [place] [address]  Lipid panel     Status: None   Collection Time: 09/17/15 10:58 AM  Result Value Ref Range   Cholesterol 177 125 - 200 mg/dL   Triglycerides 578122 <469<150 mg/dL   HDL 51 >=62>=46 mg/dL   Total CHOL/HDL Ratio 3.5  <=5.0 Ratio   VLDL 24 <30 mg/dL   LDL Cholesterol 952102 <841<130 mg/dL   Narrative   Performed at:  [facility]                [address], [address]                [place], [place] [address]

## 2015-09-25 ENCOUNTER — Encounter: Payer: Self-pay | Admitting: Family Medicine

## 2015-09-25 ENCOUNTER — Encounter: Payer: Self-pay | Admitting: Licensed Clinical Social Worker

## 2015-09-25 ENCOUNTER — Ambulatory Visit (INDEPENDENT_AMBULATORY_CARE_PROVIDER_SITE_OTHER): Payer: Commercial Managed Care - HMO | Admitting: Family Medicine

## 2015-09-25 VITALS — BP 127/86 | HR 85 | Temp 98.5°F | Ht 62.0 in | Wt 172.0 lb

## 2015-09-25 DIAGNOSIS — F4321 Adjustment disorder with depressed mood: Secondary | ICD-10-CM | POA: Insufficient documentation

## 2015-09-25 DIAGNOSIS — J45901 Unspecified asthma with (acute) exacerbation: Secondary | ICD-10-CM | POA: Diagnosis not present

## 2015-09-25 MED ORDER — PREDNISONE 50 MG PO TABS: ORAL_TABLET | ORAL | Status: DC

## 2015-09-25 NOTE — Assessment & Plan Note (Signed)
Acute exacerbation Treat with Azithromycin and prednisone Assess for improvement at 2 week FU.

## 2015-09-25 NOTE — Progress Notes (Signed)
Patient ID: Jessica Hale, female   DOB: Dec 12, 1975, 40 y.o.   MRN: [id number]  CSW received request from MD Mingo Amber to meet with patient due to concerns with grief.  CSW met with patient for emotional support and to provide resources.    CSW reviewed and discussed the grief process with patient and provided resources for Hospice Grief services.  Patient was very appreciative of talking with CSW and open to receiving help with her grief.  Patient states she will follow up on the resources provided.     Casimer Lanius. [address] [employer]  [phone] 11:10 AM

## 2015-09-25 NOTE — Patient Instructions (Signed)
Thank you for talking with me today.  I realize this is a very hard time for you.  Come back to see either me or Dr. Althea CharonKaramalegos in the next  for 2 weeks to make sure you're doing ok.  Take the prednisone and azithromycin for the next 5 days.    If you have any shortness of breath, fevers, chest pain, or worsening symptoms, please come back immediately or go to the ER after hours.  It was good to see you today!

## 2015-09-25 NOTE — Assessment & Plan Note (Signed)
Normal grief.  No SI/HI. She is, however, interested in grief counseling. Gavin Poundeborah Child psychotherapistocial Worker provided her with information from Hospice for Grief Counseling.  FU in 2 weeks to see how she's doing.  Discussed returning/calling sooner if she feels she is decompensating.

## 2015-09-25 NOTE — Progress Notes (Signed)
Subjective:    Jessica Hale is a 40 y.o. female who presents to [facility] today for 2 issues:  1.  Death in the family:  Patient very close with her aunt, who passed away fairly suddenly 2 weeks ago.  She is worried for both herself and her grandmother who has had 3 children pass away now.  Has been more tired and yet waking up with racing thoughts at night.  Has very good support system with other family at home.  Is worried she will be fired from her job due to her feeling tired all the time.  No SI/HI.   2.  Productive cough:  Patient has had increasing cough for past 2 weeks.  Wife had similar symptoms but is getting over this.  Patient with history of asthma.  Usually uses albuterol once a week if that.  She is now having to use this 3-5 times daily and at night.  Cough worse at night.  Subjective chills.    ROS as above per HPI, otherwise neg.    The following portions of the patient's history were reviewed and updated as appropriate: allergies, current medications, past medical history, family and social history, and problem list. Patient is a nonsmoker.    PMH reviewed.  Past Medical History  Diagnosis Date  . Substance abuse   . Hypertension   . Asthma   . Migraine headache    Past Surgical History  Procedure Laterality Date  . Knee surgery      Medications reviewed. Current Outpatient Prescriptions  Medication Sig Dispense Refill  . albuterol (PROVENTIL HFA;VENTOLIN HFA) 108 (90 Base) MCG/ACT inhaler Inhale 2 puffs into the lungs every 6 (six) hours as needed for wheezing. 1 Inhaler 1  . albuterol (PROVENTIL) (2.5 MG/3ML) 0.083% nebulizer solution Take 3 mLs (2.5 mg total) by nebulization every 6 (six) hours as needed for wheezing. (Patient not taking: Reported on 12/31/2014) 75 mL 5  . beclomethasone (QVAR) 40 MCG/ACT inhaler Inhale 2 puffs into the lungs 2 (two) times daily. 1 Inhaler 11  . budesonide (PULMICORT) 1 MG/2ML nebulizer solution Take 2 mLs (1 mg total) by  nebulization daily. (Patient not taking: Reported on 12/31/2014) 60 mL 2  . cetirizine (ZYRTEC) 10 MG tablet Take 1 tablet (10 mg total) by mouth daily. (Patient not taking: Reported on 12/31/2014) 30 tablet 2  . cyclobenzaprine (FLEXERIL) 5 MG tablet Take 1 tablet (5 mg total) by mouth 3 (three) times daily as needed for muscle spasms. 30 tablet 2  . EPIPEN 2-PAK 0.3 MG/0.3ML SOAJ injection inject as directed WITH BEE STING  0  . glycerin adult (GLYCERIN ADULT) 2 G SUPP Place 1 suppository rectally once. (Patient not taking: Reported on 08/29/2014) 10 suppository 2  . hydrochlorothiazide (HYDRODIURIL) 25 MG tablet Take 0.5 tablets (12.5 mg total) by mouth daily. 30 tablet 11  . lactulose (CEPHULAC) 10 G packet Take 1 packet (10 g total) by mouth daily. (Patient not taking: Reported on 08/29/2014) 30 each 0  . metoprolol tartrate (LOPRESSOR) 25 MG tablet Take 0.5 tablets (12.5 mg total) by mouth 2 (two) times daily. 60 tablet 11  . omeprazole (PRILOSEC) 20 MG capsule Take 1 capsule (20 mg total) by mouth daily. 30 capsule 5  . polyethylene glycol powder (GLYCOLAX/MIRALAX) powder Take 17 g by mouth 2 (two) times daily as needed for moderate constipation or severe constipation. (Patient not taking: Reported on 08/29/2014) 3350 g 2  . predniSONE (DELTASONE) 50 MG tablet Take 1 tab daily  x 5 days 5 tablet 0  . zolpidem (AMBIEN) 5 MG tablet Take 1 tablet (5 mg total) by mouth at bedtime as needed. for sleep 30 tablet 5   No current facility-administered medications for this visit.     Objective:   Physical Exam BP 127/86 mmHg  Pulse 85  Temp(Src) 98.5 F (36.9 C) (Oral)  Ht 5\' 2"  (1.575 m)  Wt 172 lb (78.019 kg)  BMI 31.45 kg/m2  LMP 09/15/2015 Gen:  Alert, cooperative patient who appears stated age in no acute distress.  Vital signs reviewed. Psych:  Appropriate.  Tearful when discussing her aunt.   Lungs:  Some wheezing BL bases.   No results found for this or any previous visit (from the past  72 hour(s)).

## 2016-02-16 ENCOUNTER — Other Ambulatory Visit: Payer: Self-pay | Admitting: Family Medicine

## 2016-03-03 ENCOUNTER — Other Ambulatory Visit: Payer: Self-pay | Admitting: Family Medicine

## 2016-03-03 DIAGNOSIS — K219 Gastro-esophageal reflux disease without esophagitis: Secondary | ICD-10-CM

## 2016-03-03 DIAGNOSIS — G4700 Insomnia, unspecified: Secondary | ICD-10-CM

## 2016-03-03 NOTE — Telephone Encounter (Signed)
Pt called and needs a refill on her Prilosec and Ambien. Also she would like to    Have a different inhaler called in since the one we did call in is very expensive . jw

## 2016-03-04 NOTE — Telephone Encounter (Signed)
Pt called again stating she really needs her inhaler called in. Please advise. Thanks!ep

## 2016-03-05 MED ORDER — ZOLPIDEM TARTRATE 5 MG PO TABS: 5.0000 mg | ORAL_TABLET | Freq: Every evening | ORAL | 5 refills | Status: DC | PRN

## 2016-03-05 MED ORDER — OMEPRAZOLE 20 MG PO CPDR: 20.0000 mg | DELAYED_RELEASE_CAPSULE | Freq: Every day | ORAL | 5 refills | Status: DC

## 2016-03-07 ENCOUNTER — Telehealth: Payer: Self-pay | Admitting: Family Medicine

## 2016-03-07 NOTE — Telephone Encounter (Signed)
Patient came to office to request refill RX pump for wheezing. [facility] [address].  Please let patient know when complete [phone]

## 2016-03-07 NOTE — Telephone Encounter (Signed)
Pt is calling for the doctor to call in a cheaper asthma pump for her. She can not afford the current one since she no longer has insurance. Please do as soon as you can. jw

## 2016-03-08 NOTE — Telephone Encounter (Signed)
Pt has been asking for a few days now to have a new prescription sent in for her inhaler. She needs one that she can afford. She is wheezing a lot and can not go much longer with out her inhaler, jw

## 2016-03-09 MED ORDER — ALBUTEROL SULFATE HFA 108 (90 BASE) MCG/ACT IN AERS: 2.0000 | INHALATION_SPRAY | Freq: Four times a day (QID) | RESPIRATORY_TRACT | 1 refills | Status: DC | PRN

## 2016-03-09 NOTE — Telephone Encounter (Signed)
I can refill her albuterol inhailer but I saw she needs a cheaper one. What options do we have for someone without insurance? I will swing by the clinic after my pediatric shift at 4 today and call her after I fill the prescription. I could refill the albuterol now but it sounds like she wants a cheaper one.   Durward Parcelavid Kru Allman, DO [facility], PGY-1

## 2016-03-09 NOTE — Telephone Encounter (Signed)
Called pt about need for inhaler. She does not have insurance and needs a cheaper option. On Proventil (albuterol inhaler). Pt did not pick up so left voicemail. Told her I will write prescription for cheaper option (Ventolin). Also asked her to call clinic if she cannot afford this or if she is having worsening respiratory symptoms.  Durward Parcelavid McMullen, DO [facility], PGY-1

## 2016-03-09 NOTE — Telephone Encounter (Signed)
Patient is currently on proventil(or generic albuterol).  We could try proair which might be cheaper or try contacting the pharmacy to check on cost.  Patient has [employer] listed as per insurance provider. Jazmin Hartsell,CMA

## 2016-03-15 ENCOUNTER — Other Ambulatory Visit: Payer: Self-pay | Admitting: *Deleted

## 2016-03-15 ENCOUNTER — Other Ambulatory Visit: Payer: Self-pay | Admitting: Family Medicine

## 2016-03-15 DIAGNOSIS — G4700 Insomnia, unspecified: Secondary | ICD-10-CM

## 2016-03-15 MED ORDER — ZOLPIDEM TARTRATE 5 MG PO TABS: 5.0000 mg | ORAL_TABLET | Freq: Every evening | ORAL | 5 refills | Status: DC | PRN

## 2016-03-15 NOTE — Telephone Encounter (Signed)
Pt contacted on home number. Told Ambien and Prilosec were ordered on 8/19 and were ready for pick up. Pt acknowledged and will pick up today.  Durward Parcelavid McMullen, DO [facility], PGY-1

## 2016-03-15 NOTE — Telephone Encounter (Signed)
Needs refill on her ambien. She did not receive the RX in August.

## 2016-05-18 ENCOUNTER — Ambulatory Visit (INDEPENDENT_AMBULATORY_CARE_PROVIDER_SITE_OTHER): Payer: Commercial Managed Care - HMO | Admitting: Internal Medicine

## 2016-05-18 ENCOUNTER — Encounter: Payer: Self-pay | Admitting: Internal Medicine

## 2016-05-18 VITALS — BP 148/82 | HR 99 | Temp 97.4°F | Wt 170.0 lb

## 2016-05-18 DIAGNOSIS — J069 Acute upper respiratory infection, unspecified: Secondary | ICD-10-CM | POA: Diagnosis not present

## 2016-05-18 NOTE — Progress Notes (Deleted)
   [facility] Takyla Kuchera, MD Phone: [phone]  Reason For Visit:   # *** -   Past Medical History Reviewed problem list.  Medications- reviewed and updated No additions to family history Social history- patient is a *** smoker  Objective: There were no vitals taken for this visit. Gen: NAD, alert, cooperative with exam HEENT: Normal    Neck: No masses palpated. No lymphadenopathy    Ears: Tympanic membranes intact, normal light reflex, no erythema, no bulging    Eyes: PERRLA, EOMI    Nose: nasal turbinates moist    Throat: moist mucus membranes, no erythema Cardio: regular rate and rhythm, S1S2 heard, no murmurs appreciated Pulm: clear to auscultation bilaterally, no wheezes, rhonchi or rales GI: soft, non-tender, non-distended, bowel sounds present, no hepatomegaly, no splenomegaly GU: external vaginal tissue ***, cervix ***, *** punctate lesions on cervix appreciated, *** discharge from cervical os, *** bleeding, *** cervical motion tenderness, *** abdominal/ adnexal masses Extremities: warm, well perfused, No edema, cyanosis or clubbing;  MSK: Normal gait and station Skin: dry, intact, no rashes or lesions Neuro: Strength and sensation grossly intact   Assessment/Plan: See problem based a/p  

## 2016-05-18 NOTE — Assessment & Plan Note (Signed)
Well appearing women, with upper airway congestion slowly resolving as patient is a smoker  - Patient to try home flonase to help with the congestion  - Provided counseling and expectations to the patient regarding the progression cough and congestion

## 2016-05-18 NOTE — Progress Notes (Signed)
   [facility] Noralee CharsAsiyah Zackary Mckeone, MD Phone: [phone]  Reason For Visit: SDA for sore throat   #  - Last Wednesday with slight irritated throat sore throat - Feeling really congested over the weekend  -  Has tried Ibuprofen, Alka-Seltzer, Mucinex - Patient feels that she is slowly improving  - No fevers or chills - Has been nausea x 1 over the weekend -felt like it could have been the take out that she ate  - Has not tried Flonase  - No SOB, Very slight cough - mostly nasal congestion  - No muscle aches    Past Medical History Reviewed problem list.  Medications- reviewed and updated No additions to family history Social history- patient is a smoker  Objective: BP (!) 148/82   Pulse 99   Temp 97.4 F (36.3 C) (Oral)   Wt 170 lb (77.1 kg)   LMP 05/18/2016   SpO2 99%   BMI 31.09 kg/m  Gen: NAD, alert, cooperative with exam HEENT: Normal    Neck: No masses palpated. Positive for lymphadenopathy    Ears: Tympanic membranes intact, normal light reflex, no erythema, no bulging    Nose: nasal turbinates moist, erythematous     Throat: moist mucus membranes, no erythema Cardio: regular rate and rhythm, S1S2 heard, no murmurs appreciated Pulm: clear to auscultation bilaterally, no wheezes, rhonchi or rales  Assessment/Plan: See problem based a/p   Acute upper respiratory infection Well appearing women, with upper airway congestion slowly resolving as patient is a smoker  - Patient to try home flonase to help with the congestion  - Provided counseling and expectations to the patient regarding the progression cough and congestion

## 2016-05-18 NOTE — Patient Instructions (Signed)
Try using the Flonase for improvement. You should start to feel better over the next couple of days. Follow up next week, if no improvement    Upper Respiratory Infection, Adult Most upper respiratory infections (URIs) are a viral infection of the air passages leading to the lungs. A URI affects the nose, throat, and upper air passages. The most common type of URI is nasopharyngitis and is typically referred to as "the common cold." URIs run their course and usually go away on their own. Most of the time, a URI does not require medical attention, but sometimes a bacterial infection in the upper airways can follow a viral infection. This is called a secondary infection. Sinus and middle ear infections are common types of secondary upper respiratory infections. Bacterial pneumonia can also complicate a URI. A URI can worsen asthma and chronic obstructive pulmonary disease (COPD). Sometimes, these complications can require emergency medical care and may be life threatening.  CAUSES Almost all URIs are caused by viruses. A virus is a type of germ and can spread from one person to another.  RISKS FACTORS You may be at risk for a URI if:   You smoke.   You have chronic heart or lung disease.  You have a weakened defense (immune) system.   You are very young or very old.   You have nasal allergies or asthma.  You work in crowded or poorly ventilated areas.  You work in health care facilities or schools. SIGNS AND SYMPTOMS  Symptoms typically develop 2-3 days after you come in contact with a cold virus. Most viral URIs last 7-10 days. However, viral URIs from the influenza virus (flu virus) can last 14-18 days and are typically more severe. Symptoms may include:   Runny or stuffy (congested) nose.   Sneezing.   Cough.   Sore throat.   Headache.   Fatigue.   Fever.   Loss of appetite.   Pain in your forehead, behind your eyes, and over your cheekbones (sinus  pain).  Muscle aches.  DIAGNOSIS  Your health care provider may diagnose a URI by:  Physical exam.  Tests to check that your symptoms are not due to another condition such as:  Strep throat.  Sinusitis.  Pneumonia.  Asthma. TREATMENT  A URI goes away on its own with time. It cannot be cured with medicines, but medicines may be prescribed or recommended to relieve symptoms. Medicines may help:  Reduce your fever.  Reduce your cough.  Relieve nasal congestion. HOME CARE INSTRUCTIONS   Take medicines only as directed by your health care provider.   Gargle warm saltwater or take cough drops to comfort your throat as directed by your health care provider.  Use a warm mist humidifier or inhale steam from a shower to increase air moisture. This may make it easier to breathe.  Drink enough fluid to keep your urine clear or pale yellow.   Eat soups and other clear broths and maintain good nutrition.   Rest as needed.   Return to work when your temperature has returned to normal or as your health care provider advises. You may need to stay home longer to avoid infecting others. You can also use a face mask and careful hand washing to prevent spread of the virus.  Increase the usage of your inhaler if you have asthma.   Do not use any tobacco products, including cigarettes, chewing tobacco, or electronic cigarettes. If you need help quitting, ask your health care provider.  PREVENTION  The best way to protect yourself from getting a cold is to practice good hygiene.   Avoid oral or hand contact with people with cold symptoms.   Wash your hands often if contact occurs.  There is no clear evidence that vitamin C, vitamin E, echinacea, or exercise reduces the chance of developing a cold. However, it is always recommended to get plenty of rest, exercise, and practice good nutrition.  SEEK MEDICAL CARE IF:   You are getting worse rather than better.   Your symptoms are  not controlled by medicine.   You have chills.  You have worsening shortness of breath.  You have brown or red mucus.  You have yellow or brown nasal discharge.  You have pain in your face, especially when you bend forward.  You have a fever.  You have swollen neck glands.  You have pain while swallowing.  You have white areas in the back of your throat. SEEK IMMEDIATE MEDICAL CARE IF:   You have severe or persistent:  Headache.  Ear pain.  Sinus pain.  Chest pain.  You have chronic lung disease and any of the following:  Wheezing.  Prolonged cough.  Coughing up blood.  A change in your usual mucus.  You have a stiff neck.  You have changes in your:  Vision.  Hearing.  Thinking.  Mood. MAKE SURE YOU:   Understand these instructions.  Will watch your condition.  Will get help right away if you are not doing well or get worse.   This information is not intended to replace advice given to you by your health care provider. Make sure you discuss any questions you have with your health care provider.   Document Released: 12/28/2000 Document Revised: 11/18/2014 Document Reviewed: 10/09/2013 Elsevier Interactive Patient Education [employer]

## 2016-09-05 ENCOUNTER — Other Ambulatory Visit: Payer: Self-pay | Admitting: Family Medicine

## 2016-09-05 DIAGNOSIS — J4540 Moderate persistent asthma, uncomplicated: Secondary | ICD-10-CM

## 2016-09-09 ENCOUNTER — Other Ambulatory Visit: Payer: Self-pay | Admitting: Family Medicine

## 2016-09-09 MED ORDER — ALBUTEROL SULFATE HFA 108 (90 BASE) MCG/ACT IN AERS: 2.0000 | INHALATION_SPRAY | Freq: Four times a day (QID) | RESPIRATORY_TRACT | 1 refills | Status: DC | PRN

## 2016-09-09 MED ORDER — BUDESONIDE 1 MG/2ML IN SUSP: 1.0000 mg | Freq: Every day | RESPIRATORY_TRACT | 2 refills | Status: DC

## 2016-09-09 NOTE — Telephone Encounter (Signed)
Rx filled. Ready for pick up. Please let patient know. Thank you. -- Durward Parcelavid McMullen, DO [facility], PGY-1

## 2016-09-09 NOTE — Telephone Encounter (Signed)
Pt needs a refill on her Nebulizer and also Ambien. She is scheduled to come in on March 16, which is the first available. Can we send in enough medication to last until her appointment. jw

## 2016-09-12 NOTE — Telephone Encounter (Signed)
Pt informed. Deseree Blount, CMA  

## 2016-09-30 ENCOUNTER — Ambulatory Visit (INDEPENDENT_AMBULATORY_CARE_PROVIDER_SITE_OTHER): Payer: Commercial Managed Care - HMO | Admitting: Family Medicine

## 2016-09-30 ENCOUNTER — Encounter: Payer: Self-pay | Admitting: Family Medicine

## 2016-09-30 DIAGNOSIS — F4320 Adjustment disorder, unspecified: Secondary | ICD-10-CM | POA: Diagnosis not present

## 2016-09-30 DIAGNOSIS — J45909 Unspecified asthma, uncomplicated: Secondary | ICD-10-CM | POA: Diagnosis not present

## 2016-09-30 DIAGNOSIS — Z0000 Encounter for general adult medical examination without abnormal findings: Secondary | ICD-10-CM | POA: Diagnosis not present

## 2016-09-30 DIAGNOSIS — F4321 Adjustment disorder with depressed mood: Secondary | ICD-10-CM

## 2016-09-30 DIAGNOSIS — Z716 Tobacco abuse counseling: Secondary | ICD-10-CM | POA: Diagnosis not present

## 2016-09-30 DIAGNOSIS — I10 Essential (primary) hypertension: Secondary | ICD-10-CM

## 2016-09-30 MED ORDER — METOPROLOL TARTRATE 25 MG PO TABS: 12.5000 mg | ORAL_TABLET | Freq: Two times a day (BID) | ORAL | 11 refills | Status: DC

## 2016-09-30 NOTE — Assessment & Plan Note (Addendum)
Acute. Lost family and friends 3 months ago. Coping well. No concerns for depression or HI/SI. --Offered behavioral counseling if interested at next visit

## 2016-09-30 NOTE — Assessment & Plan Note (Addendum)
Annual physical exam for work. No concerns today. Controlled asthma and interested in smoking cessation. Will need to control BP. --UTD on vaccines and screenings --Will need PAP in June 2018

## 2016-09-30 NOTE — Assessment & Plan Note (Addendum)
Uncontrolled. On HCTZ. Formerly on Countrywide FinancialLopressor. Smoker. --Continue HCTZ 12.5 mg QD --Restart Lopressor 12.5 mg QD --Will schedule plan for smoking cessation --RTC in 1 month

## 2016-09-30 NOTE — Progress Notes (Signed)
   Subjective:   Patient ID: Jessica Hale    DOB: 05-28-1976, 41 y.o. female   MRN: [id number]  CC: "physical"  HPI: Jessica Eonatasha M Gries is a 41 y.o. female who presents to clinic today annual wellness physical. Problems discussed today are as follows:  Wellness physical: Pt here for work physical exam. No concerns today. Making efforts to include vegetables in diet and avoid excessive carbs. Interested in flu shot. Was formerly on lopressor but stopped last year due to controlled BP levels.  Smoker: Using SABA 1-2 times per month. Says never followed up with PFT as instructed last year. Smoker 5-6 cigs daily and wants to stop.  Grief: Lost 3 friends including and uncle who she was close to over past 3 months. Says she does not have feeling of depression but has moments where she misses them. Able to continue ADL w/o impairment since deaths. Has great support group with family members in area.  ROS: complete ROS performed, see HPI for pertinent ROS.  PMFSH: HTN, tobacco use disorder, asthma, GERD, obesity. Smoking status reviewed. Medications reviewed.  Objective:   BP (!) 158/98   Pulse 82   Temp 98.5 F (36.9 C) (Oral)   Wt 178 lb 6.4 oz (80.9 kg)   SpO2 99%   BMI 32.63 kg/m  Vitals and nursing note reviewed.  General: well nourished, well developed, in no acute distress with non-toxic appearance HEENT: normocephalic, atraumatic, moist mucous membranes Neck: supple, non-tender without lymphadenopathy CV: regular rate and rhythm without murmurs, rubs, or gallops, no lower extremity edema Lungs: clear to auscultation bilaterally with normal work of breathing Abdomen: soft, non-tender, non-distended, no masses or organomegaly palpable, normoactive bowel sounds Skin: warm, dry, no rashes or lesions, cap refill < 2 seconds Extremities: warm and well perfused, normal tone  Assessment & Plan:   Primary hypertension Uncontrolled. On HCTZ. Formerly on [employer] FinancialLopressor. Smoker. --Continue  HCTZ 12.5 mg QD --Restart Lopressor 12.5 mg QD --Will schedule plan for smoking cessation --RTC in 1 month  Mild intermittent asthma Controlled. Smoker. On albuterol inhaler with minimal use. Using Spiriva and QVAR. --Continue albuterol inhaler as needed --Continue QVAR and Spiriva --Referral placed for Dr. Raymondo BandKoval concerning PFT and smoking cessation  Grief Acute. Lost family and friends 3 months ago. Coping well. No concerns for depression or HI/SI. --Offered behavioral counseling if interested at next visit  Tobacco use disorder Smoker 5-6 cigs. Smoked 5-6 years. Pack years 3 years. Interested in stopping. No former attempts of medication for cessation. --Will schedule appt with Dr. Raymondo BandKoval for cessation when seen for PFTs (see asthma) --Instructed to call 1-800-QUIT-NOW for assistance if interested in meantime  Encounter for preventative adult health care examination Annual physical exam for work. No concerns today. Controlled asthma and interested in smoking cessation. Will need to control BP. --UTD on vaccines and screenings --Will need PAP in June 2018  No orders of the defined types were placed in this encounter.  Meds ordered this encounter  Medications  . metoprolol tartrate (LOPRESSOR) 25 MG tablet    Sig: Take 0.5 tablets (12.5 mg total) by mouth 2 (two) times daily.    Dispense:  60 tablet    Refill:  11    Durward Parcelavid McMullen, DO [facility], PGY-1 10/02/2016 4:53 PM

## 2016-09-30 NOTE — Assessment & Plan Note (Addendum)
Controlled. Smoker. On albuterol inhaler with minimal use. Using Spiriva and QVAR. --Continue albuterol inhaler as needed --Continue QVAR and Spiriva --Referral placed for Dr. Raymondo BandKoval concerning PFT and smoking cessation

## 2016-09-30 NOTE — Patient Instructions (Signed)
Thank you for coming in to see us today. Please see below to review our plan for today's visit.  1. Her blood pressure is elevated. Please continue taking HCTZ and restart her Lopressor. I sent in a prescription to your pharmacy for this medication. Please monitor and record her blood pressure readings a few times during the week and bring these in to your next follow-up in one month. 2. Please request an appointment with Dr. Raymondo BandKoval for smoking cessation and pulmonary function testing as you leave today. 3. Return to clinic in one month.  Please call the clinic at [phone] if your symptoms worsen or you have any concerns. It was my pleasure to see you. -- Durward Parcelavid Ethyle Tiedt, DO [facility], PGY-1

## 2016-09-30 NOTE — Assessment & Plan Note (Addendum)
Smoker 5-6 cigs. Smoked 5-6 years. Pack years 3 years. Interested in stopping. No former attempts of medication for cessation. --Will schedule appt with Dr. Raymondo BandKoval for cessation when seen for PFTs (see asthma) --Instructed to call 1-800-QUIT-NOW for assistance if interested in meantime

## 2016-10-05 ENCOUNTER — Other Ambulatory Visit: Payer: Self-pay | Admitting: Family Medicine

## 2016-10-05 ENCOUNTER — Other Ambulatory Visit: Payer: Self-pay | Admitting: *Deleted

## 2016-10-05 DIAGNOSIS — K219 Gastro-esophageal reflux disease without esophagitis: Secondary | ICD-10-CM

## 2016-10-05 DIAGNOSIS — G4700 Insomnia, unspecified: Secondary | ICD-10-CM

## 2016-10-05 MED ORDER — ZOLPIDEM TARTRATE 5 MG PO TABS: 5.0000 mg | ORAL_TABLET | Freq: Every evening | ORAL | 5 refills | Status: DC | PRN

## 2016-10-10 ENCOUNTER — Other Ambulatory Visit: Payer: Self-pay | Admitting: Family Medicine

## 2016-10-10 DIAGNOSIS — G4700 Insomnia, unspecified: Secondary | ICD-10-CM

## 2016-10-10 DIAGNOSIS — K219 Gastro-esophageal reflux disease without esophagitis: Secondary | ICD-10-CM

## 2016-10-10 NOTE — Telephone Encounter (Signed)
Lisinopril is not listed on current medication list. Clovis PuMartin, Tamika L, RN

## 2016-10-10 NOTE — Telephone Encounter (Signed)
Ambien and Prilosec filled on 10/05/16. Please inform pt these meds are available for pick up. No h/o Lisiopril since 2013 and was discontinued due to cough (started on amlodipine instead). Will not fill for Lisinopril as there is no indication at this point. Please inform pt. Thank you. -- Durward Parcelavid Windi Toro, DO [facility], PGY-1

## 2016-10-10 NOTE — Telephone Encounter (Signed)
Pt calling to request refill of:  Name of Medication(s):  Omeprazole, Ambien, and lisinopril  Last date of OV: 09-30-16 [facility] [place]   Will route refill request to Clinic RN.  Discussed with patient policy to call pharmacy for future refills.  Also, discussed refills may take up to 48 hours to approve or deny.  Markus JarvisEmily C Pittman  *pt needs to get this refilled as soon as possible, pt's last day at her job is Wednesday and pt is unsure how soon after her insurance will be canceled.*ep

## 2016-10-13 ENCOUNTER — Telehealth: Payer: Self-pay | Admitting: Family Medicine

## 2016-10-13 MED ORDER — ZOLPIDEM TARTRATE 5 MG PO TABS: 5.0000 mg | ORAL_TABLET | Freq: Every evening | ORAL | 0 refills | Status: DC | PRN

## 2016-10-13 NOTE — Addendum Note (Signed)
Addended by: Janit PaganENIOLA, KEHINDE T on: 10/13/2016 02:10 PM   Modules accepted: Orders

## 2016-10-13 NOTE — Telephone Encounter (Signed)
Ambien Rx is not up front. Pt needs to pick this up today because pt's insurance runs out this month. ep

## 2016-10-13 NOTE — Telephone Encounter (Signed)
Pt informed. Gustavo Meditz Dawn, CMA  

## 2016-10-13 NOTE — Telephone Encounter (Signed)
Per documentation, refilled was placed up front by PCP. I will give Ambien with few day supply till she is able to get in touch with her PCP next week.  Dr. McMullen please address this issue.  Prescription called in with 10 tablets. I spoke with the [facility] pharmacy ( Satya Mordi)  Note forwarded to him. 

## 2016-10-13 NOTE — Telephone Encounter (Addendum)
Per documentation, refilled was placed up front by PCP. I will give Ambien with few day supply till she is able to get in touch with her PCP next week.  Dr. Abelardo DieselMcMullen please address this issue.  Prescription called in with 10 tablets. [facility]( Satya Mordi)  Note forwarded to him.

## 2016-10-13 NOTE — Telephone Encounter (Signed)
Will forward to PCP and Shanda BumpsJessica, CMA.  Clovis PuMartin, Tamika L, RN

## 2016-10-17 ENCOUNTER — Other Ambulatory Visit: Payer: Self-pay | Admitting: Family Medicine

## 2016-10-17 DIAGNOSIS — G4700 Insomnia, unspecified: Secondary | ICD-10-CM

## 2016-10-17 NOTE — Telephone Encounter (Signed)
Somehow I managed to misinterpret when she had received her medications. Called in a prescription to pharmacy. Thank you for giving her a short-term prescription and for notifying me. -- Durward Parcel, DO [facility], PGY-1

## 2016-10-17 NOTE — Telephone Encounter (Signed)
Prescription for Ambien 5 mg QD, #30 pills, 2 refills called into pharmacy. Please notify patient prescription is available for pickup. Thank you. -- Durward Parcel, DO [facility], PGY-1

## 2016-10-18 NOTE — Telephone Encounter (Signed)
Left message on patient voicemail that rx had been called into pharmacy.

## 2016-10-18 NOTE — Telephone Encounter (Signed)
Thanks for following up on this. 

## 2016-11-01 ENCOUNTER — Other Ambulatory Visit: Payer: Self-pay | Admitting: Family Medicine

## 2016-11-01 DIAGNOSIS — I10 Essential (primary) hypertension: Secondary | ICD-10-CM

## 2016-11-02 MED ORDER — HYDROCHLOROTHIAZIDE 25 MG PO TABS: 12.5000 mg | ORAL_TABLET | Freq: Every day | ORAL | 11 refills | Status: DC

## 2016-11-02 NOTE — Addendum Note (Signed)
Addended by: Henri Medal on: 11/02/2016 08:57 AM   Modules accepted: Orders

## 2016-11-06 ENCOUNTER — Other Ambulatory Visit: Payer: Self-pay | Admitting: Family Medicine

## 2016-11-06 DIAGNOSIS — J4540 Moderate persistent asthma, uncomplicated: Secondary | ICD-10-CM

## 2016-12-18 ENCOUNTER — Other Ambulatory Visit: Payer: Self-pay | Admitting: Family Medicine

## 2016-12-18 DIAGNOSIS — J4540 Moderate persistent asthma, uncomplicated: Secondary | ICD-10-CM

## 2016-12-20 ENCOUNTER — Other Ambulatory Visit: Payer: Self-pay | Admitting: Family Medicine

## 2016-12-20 DIAGNOSIS — J4520 Mild intermittent asthma, uncomplicated: Secondary | ICD-10-CM

## 2016-12-20 MED ORDER — FLUTICASONE PROPIONATE HFA 44 MCG/ACT IN AERO: 2.0000 | INHALATION_SPRAY | Freq: Two times a day (BID) | RESPIRATORY_TRACT | 12 refills | Status: DC

## 2017-01-23 ENCOUNTER — Encounter: Payer: Self-pay | Admitting: Student

## 2017-01-23 ENCOUNTER — Telehealth: Payer: Self-pay | Admitting: Family Medicine

## 2017-01-23 ENCOUNTER — Ambulatory Visit (INDEPENDENT_AMBULATORY_CARE_PROVIDER_SITE_OTHER): Payer: Commercial Managed Care - HMO | Admitting: Student

## 2017-01-23 VITALS — BP 125/80 | HR 88 | Temp 98.9°F | Ht 62.0 in | Wt 175.0 lb

## 2017-01-23 DIAGNOSIS — L299 Pruritus, unspecified: Secondary | ICD-10-CM | POA: Diagnosis not present

## 2017-01-23 DIAGNOSIS — J4520 Mild intermittent asthma, uncomplicated: Secondary | ICD-10-CM | POA: Diagnosis not present

## 2017-01-23 DIAGNOSIS — R197 Diarrhea, unspecified: Secondary | ICD-10-CM

## 2017-01-23 MED ORDER — FLUTICASONE PROPIONATE HFA 44 MCG/ACT IN AERO: 2.0000 | INHALATION_SPRAY | Freq: Two times a day (BID) | RESPIRATORY_TRACT | 12 refills | Status: DC

## 2017-01-23 MED ORDER — LOPERAMIDE HCL 2 MG PO TABS: 2.0000 mg | ORAL_TABLET | Freq: Four times a day (QID) | ORAL | 0 refills | Status: DC | PRN

## 2017-01-23 MED ORDER — HYDROXYZINE HCL 25 MG PO TABS: 25.0000 mg | ORAL_TABLET | Freq: Three times a day (TID) | ORAL | 0 refills | Status: DC | PRN

## 2017-01-23 MED ORDER — ALBUTEROL SULFATE HFA 108 (90 BASE) MCG/ACT IN AERS
2.0000 | INHALATION_SPRAY | Freq: Four times a day (QID) | RESPIRATORY_TRACT | 1 refills | Status: DC | PRN
Start: 2017-01-23 — End: 2017-12-29

## 2017-01-23 NOTE — Assessment & Plan Note (Addendum)
Likely heat rash. It could also be viral exanthem in the setting of GI symptoms including nausea and diarrhea which could suggest a viral gastroenteritis. Doubt this is anaphylaxis. The skin rash is not typical for urticaria. It could also be a reaction to the dryer sheet as she reports.  -Recommended avoiding the dryer sheets if this could be contributing -Recommended light clothes -Hydroxyzine 25 mg 3 times a day. Discussed about the adverse effects including drowsiness and sedation -Discussed return precautions

## 2017-01-23 NOTE — Telephone Encounter (Signed)
Patient was just seen today but still needs a [facility] on [facility] [address]  Thank you.

## 2017-01-23 NOTE — Patient Instructions (Signed)
It was great seeing you today! We have addressed the following issues today 1. Pruritus/itching: we have sent a prescription for hydroxyzine to your pharmacy. You can take this medication up to 3 times a day for itching. However, this medication might make you drowsy/sleepy. So we don't recommend driving or operating machinery while taking this medication. We also recommend avoiding the dryer sheet that could have triggered the itching.  2.   Diarrhea: You can try loperamide, which is available over-the-counter. If no improvement with this or other symptoms concerning, please come back and see us.   If we did any lab work today, and the results require attention, either me or my nurse will get in touch with you. If everything is normal, you will get a letter in mail and a message via . If you don't hear from us in two weeks, please give us a call. Otherwise, we look forward to seeing you again at your next visit. If you have any questions or concerns before then, please call the clinic at [phone].  Please bring all your medications to every doctors visit  Sign up for My Chart to have easy access to your labs results, and communication with your Primary care physician.    Please check-out at the front desk before leaving the clinic.    Take Care,   Dr. Alanda SlimGonfa

## 2017-01-23 NOTE — Assessment & Plan Note (Addendum)
Likely viral gastroenteritis but no abdominal pain. She has some nausea but no URI symptoms either. Abdominal exam is benign. Recommended adequate hydration. She can try loperamide as needed. I also discussed about the side effects of long-term use of PPI and recommended stopping that she agrees to.

## 2017-01-23 NOTE — Progress Notes (Signed)
Subjective:    Jessica Hale is a 41 y.o. old female here for skin itching  HPI Pruritus: started about 3 days ago. Itching over her arms, legs, chest and back. No notable skin rash. Denies tongue swelling, voice change, throat irritation, shortness of breath, chest pain, fever or URI symptoms. Admits nausea and diarrhea for the last 3 days. The diarrhea is loose stool without blood. She reports up to 3 bowel movements a day.Denies emesis or abdominal pain. She states she used a new dryer sheet. This is the only possible trigger she remembers. She denies new soap, detergent, medicines or food.  She tried Benadryl 1 tablet daily for the last 3 days. Can she think this is not helping. She also tried calamine lotion and oatmeal bath.  She is allergic to bee sting. She has an EpiPen at home. Smokes about 5 cigarettes a day. Drinks alcohol occasionally. Denies recreational drug use.  PMH/Problem List: has Tobacco use disorder; Primary hypertension; Mild intermittent asthma; GERD; TB SKIN TEST, POSITIVE; Atypical migraine; Allergic rhinitis; Diarrhea; Meniere's disease (cochlear hydrops); Costochondritis; Palpitations; Constipation; Breast mass in female; Obesity; Grief; Encounter for preventative adult health care examination; and Pruritus on her problem list.   has a past medical history of Asthma; Hypertension; Migraine headache; and Substance abuse.  FH:  No family history on file.  SH Social History  Substance Use Topics  . Smoking status: Current Every Day Smoker    Packs/day: 0.25    Years: 5.00    Types: Cigarettes  . Smokeless tobacco: Never Used  . Alcohol use 0.0 oz/week     Comment: occasional    Review of Systems Review of systems negative except for pertinent positives and negatives in history of present illness above.     Objective:     Vitals:   01/23/17 0933 01/23/17 1001  BP: (!) 148/88 125/80  Pulse: 88   Temp: 98.9 F (37.2 C)   TempSrc: Oral   SpO2: 99%     Weight: 175 lb (79.4 kg)   Height: 5\' 2"  (1.575 m)     Physical Exam GEN: appears well, no apparent distress. Eyes: conjunctiva without injection, sclera anicteric Ears: external ear and ear canal normal Nares: no rhinorrhea, congestion or erythema Oropharynx: mmm without erythema, tongue swelling or exudation HEM: negative for cervical or periauricular lymphadenopathies CVS: RRR, nl S1&S2, no murmurs, no edema RESP: speaks in full sentence, no IWOB, good air movement bilaterally, CTAB GI: BS present & normal, soft, NTND MSK: no focal tenderness or notable swelling SKIN: Scattered erythematous papular rash over her lower back and shoulders. See picture for more.   NEURO: alert and oiented appropriately, no gross defecits  PSYCH: euthymic mood with congruent affect    Assessment and Plan:  Pruritus Likely heat rash. It could also be viral exanthem in the setting of GI symptoms including nausea and diarrhea which could suggest a viral gastroenteritis. Doubt this is anaphylaxis. The skin rash is not typical for urticaria. It could also be a reaction to the dryer sheet as she reports.  -Recommended avoiding the dryer sheets if this could be contributing -Recommended light clothes -Hydroxyzine 25 mg 3 times a day. Discussed about the adverse effects including drowsiness and sedation -Discussed return precautions  Diarrhea Likely viral gastroenteritis but no abdominal pain. She has some nausea but no URI symptoms either. Abdominal exam is benign. Recommended adequate hydration. She can try loperamide as needed. I also discussed about the side effects of long-term use of PPI  and recommended stopping that she agrees to.   Mild intermittent asthma Stable. Refilled her fluticasone and albuterol today.  Meds ordered this encounter  Medications  . loperamide (LOPERAMIDE A-D) 2 MG tablet    Sig: Take 1 tablet (2 mg total) by mouth 4 (four) times daily as needed for diarrhea or loose  stools.    Dispense:  30 tablet    Refill:  0  . hydrOXYzine (ATARAX/VISTARIL) 25 MG tablet    Sig: Take 1 tablet (25 mg total) by mouth 3 (three) times daily as needed.    Dispense:  30 tablet    Refill:  0  . albuterol (VENTOLIN HFA) 108 (90 Base) MCG/ACT inhaler    Sig: Inhale 2 puffs into the lungs every 6 (six) hours as needed for wheezing or shortness of breath.    Dispense:  1 Inhaler    Refill:  1  . fluticasone (FLOVENT HFA) 44 MCG/ACT inhaler    Sig: Inhale 2 puffs into the lungs 2 (two) times daily.    Dispense:  1 Inhaler    Refill:  12   Return if symptoms worsen or fail to improve.  Almon Hercules, MD 01/23/17 Pager: [phone]

## 2017-01-23 NOTE — Assessment & Plan Note (Signed)
Stable. Refilled her fluticasone and albuterol today.

## 2017-01-24 ENCOUNTER — Other Ambulatory Visit: Payer: Self-pay | Admitting: *Deleted

## 2017-01-24 DIAGNOSIS — G4700 Insomnia, unspecified: Secondary | ICD-10-CM

## 2017-01-25 MED ORDER — ZOLPIDEM TARTRATE 5 MG PO TABS
5.0000 mg | ORAL_TABLET | Freq: Every evening | ORAL | 0 refills | Status: DC | PRN
Start: 2017-01-25 — End: 2017-02-06

## 2017-01-26 NOTE — Telephone Encounter (Signed)
Pt states pharmacy does not have the Rx for Ambien. ep

## 2017-01-26 NOTE — Telephone Encounter (Signed)
Left message on [facility] pharmacy line for refill on Ambien 5 mg 1 tablet by mouth at bedtime PRN sleep, #10, 0 refills by Dr. Abelardo DieselMcMullen.  Clovis PuMartin, Tamika L, RN

## 2017-02-06 ENCOUNTER — Other Ambulatory Visit: Payer: Self-pay | Admitting: Family Medicine

## 2017-02-06 DIAGNOSIS — G4700 Insomnia, unspecified: Secondary | ICD-10-CM

## 2017-02-07 ENCOUNTER — Telehealth: Payer: Self-pay | Admitting: Family Medicine

## 2017-02-07 NOTE — Telephone Encounter (Signed)
Pt called because her insurance doesn't take effect until September of this year. She can not afford some of her medications and would like to speak to a nurse about what else that can be called in that would be cheaper. Please call to discuss. jw

## 2017-02-08 NOTE — Telephone Encounter (Signed)
Would need to know what meds she needs called in. Please advise.

## 2017-02-09 ENCOUNTER — Telehealth: Payer: Self-pay | Admitting: Family Medicine

## 2017-02-09 ENCOUNTER — Other Ambulatory Visit: Payer: Self-pay | Admitting: Family Medicine

## 2017-02-09 DIAGNOSIS — I10 Essential (primary) hypertension: Secondary | ICD-10-CM

## 2017-02-09 MED ORDER — METOPROLOL TARTRATE 25 MG PO TABS: 12.5000 mg | ORAL_TABLET | Freq: Two times a day (BID) | ORAL | 11 refills | Status: DC

## 2017-02-09 MED ORDER — HYDROCHLOROTHIAZIDE 25 MG PO TABS: 12.5000 mg | ORAL_TABLET | Freq: Every day | ORAL | 11 refills | Status: DC

## 2017-02-09 NOTE — Telephone Encounter (Signed)
Pt needs a cheaper alternative for Flovent until insurance kicks in in September. Pt also states she needs the cheapest alternative with all of her other medications as well. Pt uses [facility] [place]. ep

## 2017-02-09 NOTE — Telephone Encounter (Signed)
Error

## 2017-02-09 NOTE — Telephone Encounter (Signed)
Contacted patient. Prescriptions refilled. Will [facility] week to substitute Flovent when Dr. Raymondo BandKoval is in the office.

## 2017-02-09 NOTE — Telephone Encounter (Signed)
Contacted patient regarding loss of insurance until 03/18/2017. Refill place for Lopressor and HCTZ. Patient currently has Ambien prescription filled. Removed Prilosec form medications due to long-term use. Will discuss with Dr. Raymondo BandKoval next week obtaining Dulera sample to substitute Flovent. Patient agreement with no further questions. -- Durward Parcelavid Topacio Cella, DO [facility], PGY-2

## 2017-02-13 ENCOUNTER — Other Ambulatory Visit: Payer: Self-pay | Admitting: Family Medicine

## 2017-02-13 ENCOUNTER — Telehealth: Payer: Self-pay | Admitting: Family Medicine

## 2017-02-13 DIAGNOSIS — G4700 Insomnia, unspecified: Secondary | ICD-10-CM

## 2017-02-13 NOTE — Telephone Encounter (Signed)
Pt called because she went to the pharmacy to get Ambien and the pharmacy didn't have it. It shows print in epic but there was no prescription up front. Can we check on this and also not forget to let Dr. Raymondo BandKoval know that she still needs samples of Dulera sample for a substitute for Flovent. jw

## 2017-02-14 NOTE — Telephone Encounter (Signed)
I filled a 30 day supply of Zolpidem on 7/24. Please advise. Thank you.

## 2017-02-14 NOTE — Telephone Encounter (Signed)
Never mind I see there was an issue with the pharmacy. Will contact them tomorrow to identify the issue.

## 2017-02-14 NOTE — Telephone Encounter (Signed)
Pt called again about her refills.

## 2017-02-15 MED ORDER — FLUTICASONE FUROATE-VILANTEROL 100-25 MCG/INH IN AEPB
1.0000 | INHALATION_SPRAY | Freq: Every day | RESPIRATORY_TRACT | 0 refills | Status: DC
Start: 2017-02-15 — End: 2017-12-29

## 2017-02-15 NOTE — Telephone Encounter (Signed)
Called in Ambien Rx today and also Dr. Raymondo BandKoval gave pt 2 of the Breo Ellipta 100/25. Lamonte SakaiZimmerman Rumple, April D, [place]

## 2017-03-20 DIAGNOSIS — Z719 Counseling, unspecified: Secondary | ICD-10-CM | POA: Diagnosis not present

## 2017-03-27 ENCOUNTER — Telehealth: Payer: Self-pay | Admitting: Family Medicine

## 2017-03-27 DIAGNOSIS — G4700 Insomnia, unspecified: Secondary | ICD-10-CM

## 2017-03-27 NOTE — Telephone Encounter (Signed)
Pt cannot afford the flo vent because of her insurance.  Please switch her back to Qvar.  She also needs a refill on Ambien.  [facility] [address]

## 2017-03-28 ENCOUNTER — Other Ambulatory Visit: Payer: Self-pay | Admitting: Family Medicine

## 2017-03-28 DIAGNOSIS — J4520 Mild intermittent asthma, uncomplicated: Secondary | ICD-10-CM

## 2017-03-28 DIAGNOSIS — G4700 Insomnia, unspecified: Secondary | ICD-10-CM

## 2017-03-28 MED ORDER — ZOLPIDEM TARTRATE 5 MG PO TABS: 5.0000 mg | ORAL_TABLET | Freq: Every evening | ORAL | 0 refills | Status: DC | PRN

## 2017-03-28 MED ORDER — BECLOMETHASONE DIPROP HFA 40 MCG/ACT IN AERB: 2.0000 | INHALATION_SPRAY | Freq: Two times a day (BID) | RESPIRATORY_TRACT | 6 refills | Status: DC

## 2017-03-28 NOTE — Telephone Encounter (Signed)
Pt was given inhaler samples about 2 weeks ago and has used them all. Sh has been without inhaler for about 2 weeks.  When can she expect a resolution?

## 2017-03-28 NOTE — Telephone Encounter (Signed)
Qvar is no longer available to order. If flovent is expensive then change to Qvar Redihaler, , which may not be covered by patient's insurance.

## 2017-03-29 ENCOUNTER — Other Ambulatory Visit: Payer: Self-pay | Admitting: Family Medicine

## 2017-03-29 ENCOUNTER — Telehealth: Payer: Self-pay | Admitting: Family Medicine

## 2017-03-29 DIAGNOSIS — Z719 Counseling, unspecified: Secondary | ICD-10-CM | POA: Diagnosis not present

## 2017-03-29 DIAGNOSIS — G4700 Insomnia, unspecified: Secondary | ICD-10-CM

## 2017-03-29 MED ORDER — ZOLPIDEM TARTRATE 5 MG PO TABS: 5.0000 mg | ORAL_TABLET | Freq: Every evening | ORAL | 0 refills | Status: DC | PRN

## 2017-03-29 NOTE — Telephone Encounter (Signed)
Pt picked up her prescription for Ambien and the pharmacy will not fill this because it is saying that DR. Abelardo DieselMcMullen is not authorized to write this. Can we have one of the faculty members write a new one and [facility] [address]. If she has to pick this back up please call and let her know. jw

## 2017-03-29 NOTE — Progress Notes (Signed)
Rx called into [facility] on [address] per patient request.

## 2017-03-29 NOTE — Progress Notes (Signed)
Dr. Abelardo DieselMcMullen having trouble with E Rx so I will have the RED TEAM call AMBIEN refill as below  in under my name. THANKS! Denny LevySara Neal

## 2017-03-29 NOTE — Telephone Encounter (Signed)
Discussed this with Tamika and Dr. Jennette KettleNeal who will apoint somebody from red team to fill the prescription. Thanks.

## 2017-04-05 DIAGNOSIS — Z719 Counseling, unspecified: Secondary | ICD-10-CM | POA: Diagnosis not present

## 2017-04-12 DIAGNOSIS — Z719 Counseling, unspecified: Secondary | ICD-10-CM | POA: Diagnosis not present

## 2017-04-13 ENCOUNTER — Encounter: Payer: Self-pay | Admitting: Internal Medicine

## 2017-04-13 ENCOUNTER — Ambulatory Visit (INDEPENDENT_AMBULATORY_CARE_PROVIDER_SITE_OTHER): Payer: 59 | Admitting: Internal Medicine

## 2017-04-13 DIAGNOSIS — R109 Unspecified abdominal pain: Secondary | ICD-10-CM | POA: Insufficient documentation

## 2017-04-13 DIAGNOSIS — R10A1 Flank pain, right side: Secondary | ICD-10-CM | POA: Insufficient documentation

## 2017-04-13 NOTE — Progress Notes (Signed)
   Subjective:   [name]       Birthdate: 14-Sep-1975       MRN: [id number]      HPI  Jessica Hale is a 41 y.o. female presenting for R back/flank pain.   R back/flank pain Began 5 days ago. Patient also developed diarrhea around the same time that resolved about two days later. Denies fevers or vomiting. Patient says that the pain is better after she has moved around some, and worsens when she is sitting still. Also has pain when laying on the R side. Pain has woken her up at night. Has taken ibuprofen which didn't help much. Has been using a muscle rub as well as Icy Hot pads which have helped significantly. Soaking in a warm bath helped as well. Patient has a history of back pain and says this pain feels similar but is just in a different location. She was seeing a chiropractor which significantly improved her back pain but can no longer afford this. Denies dysuria, hematuria, increased frequency.   Smoking status reviewed. Patient is current every day smoker.   Review of Systems See HPI.     Objective:  Physical Exam  Constitutional: She is oriented to person, place, and time and well-developed, well-nourished, and in no distress.  HENT:  Head: Normocephalic and atraumatic.  Eyes: Conjunctivae and EOM are normal. Right eye exhibits no discharge. Left eye exhibits no discharge.  Pulmonary/Chest: Effort normal. No respiratory distress.  Abdominal: Soft. Bowel sounds are normal. She exhibits no distension. There is no tenderness. There is no rebound and no guarding.  Musculoskeletal:  TTP of R flank, R thoracic region, R lumbar region. Full ROM. Able to sit, stand, and walk unassisted without pain. No CVA tenderness.   Neurological: She is alert and oriented to person, place, and time.  Skin: Skin is warm and dry.  Psychiatric: Affect and judgment normal.      Assessment & Plan:  Right flank pain Most consistent with MSK etiology, as pain reproducible on palpation  and relief with muscle rubs and heating pad. Likely muscle strain. Less likely pyelonephritis or renal/urinary etiology, as no urinary symptoms and negative CVA tenderness. Also well-appearing and afebrile. Will treat conservatively with scheduled NSAIDs for next 3 days and continue muscle rub, heating pads, and warm baths.    Tarri Abernethy, MD, MPH PGY-3 [facility] Pager [phone]

## 2017-04-13 NOTE — Assessment & Plan Note (Signed)
Most consistent with MSK etiology, as pain reproducible on palpation and relief with muscle rubs and heating pad. Likely muscle strain. Less likely pyelonephritis or renal/urinary etiology, as no urinary symptoms and negative CVA tenderness. Also well-appearing and afebrile. Will treat conservatively with scheduled NSAIDs for next 3 days and continue muscle rub, heating pads, and warm baths.

## 2017-04-13 NOTE — Patient Instructions (Signed)
It was nice meeting you today Ms. Mcclatchy!  Please begin taking two ibuprofen tablets every 6 hours for the next few days. You can continue using the muscle rub, warm baths, and hot pads to help with pain as well.   If you have any questions or concerns, please feel free to call the clinic.   Be well,  Dr. Natale Milch

## 2017-04-19 DIAGNOSIS — Z719 Counseling, unspecified: Secondary | ICD-10-CM | POA: Diagnosis not present

## 2017-04-26 DIAGNOSIS — Z719 Counseling, unspecified: Secondary | ICD-10-CM | POA: Diagnosis not present

## 2017-05-12 ENCOUNTER — Encounter: Payer: 59 | Admitting: Family Medicine

## 2017-05-17 DIAGNOSIS — Z719 Counseling, unspecified: Secondary | ICD-10-CM | POA: Diagnosis not present

## 2017-05-24 DIAGNOSIS — Z719 Counseling, unspecified: Secondary | ICD-10-CM | POA: Diagnosis not present

## 2017-05-29 ENCOUNTER — Other Ambulatory Visit: Payer: Self-pay | Admitting: Family Medicine

## 2017-05-29 DIAGNOSIS — G4700 Insomnia, unspecified: Secondary | ICD-10-CM

## 2017-05-31 DIAGNOSIS — Z719 Counseling, unspecified: Secondary | ICD-10-CM | POA: Diagnosis not present

## 2017-06-01 ENCOUNTER — Encounter: Payer: Self-pay | Admitting: Family Medicine

## 2017-06-01 NOTE — Progress Notes (Signed)
   Subjective   Patient ID: Jessica Hale    DOB: Sep 25, 1975, 41 y.o. female   MRN: [id number]  CC: "Physical exam"  HPI: Jessica Hale is a 41 y.o. female who presents to clinic today for the following:  Annual wellness physical: Here for annual wellness visit.  No concerns today.  Smoker.  Has not received flu shot.  Wants Pap updated today.  Requesting refill of zolpidem for insomnia which is well controlled with medication.  Denies fevers or chills, nausea or vomiting, headache, abdominal pain, pelvic pain, vaginal bleeding or discharge.  Asthma: Well controlled with minimal use of inhaler.  Requesting refills for controller. Denies cough, chest pain, shortness of breath, wheeze.  Breast mass: Patient states she felt a lump in her left breast back in 03/2015.  Denies family history of breast cancer.  States the mass disappeared within a few weeks and was never felt by physician at her appointment.  No history of image to evaluate breast tissue.  No lumps since.  Smoker: Current every day.  Smokes 5-7 cigs daily.  Not interested in cessation today.  ROS: see HPI for pertinent.  PMFSH: HTN, tobacco use disorder, asthma, GERD, obesity.  Surgical history knee.  Family history unremarkable.  Smoking status reviewed. Medications reviewed.  Objective   BP 118/78   Pulse 68   Temp 98.5 F (36.9 C) (Oral)   Ht 5\' 2"  (1.575 m)   Wt 178 lb (80.7 kg)   LMP 05/10/2017 (Exact Date)   SpO2 99%   BMI 32.56 kg/m  Vitals and nursing note reviewed.  General: well nourished, well developed, NAD with non-toxic appearance HEENT: normocephalic, atraumatic, moist mucous membranes Neck: supple, non-tender without lymphadenopathy Cardiovascular: regular rate and rhythm without murmurs, rubs, or gallops Lungs: clear to auscultation bilaterally with normal work of breathing Abdomen: soft, non-tender, non-distended, normoactive bowel sounds GU: accompanied by chaperone, no external lesions or rash,  normal vaginal rugae, no odor, cervical os non-friable, without discharge, no cervical or adnexal tendeness Skin: warm, dry, no rashes or lesions, cap refill < 2 seconds Extremities: warm and well perfused, normal tone, no edema  Assessment & Plan   Tobacco use disorder Chronic.  Not interested in cessation.  Has tried nicotine supplements with 1 trial in past.  Would consider alternatives such as Chantix or Wellbutrin. - Given number to 1-800-QUIT-NOW and discussed importance of smoking cessation  Mild intermittent asthma Chronic.  Well controlled with controller and minimal use of rescue inhaler.  Smoking. - Given refill for beclomethasone inhaler - Administered flu shot  Breast mass in female Resolved.  Likely fibroadenoma given quick resolution.  No image or mentioned follow up.  No red flags.  Will meet criteria for mammogram screen next year. - Discussed importance of mammogram starting next year, patient requests to begin screening at age 41  Orders Placed This Encounter  Procedures  . Flu Vaccine QUAD 36+ mos IM   Meds ordered this encounter  Medications  . zolpidem (AMBIEN) 5 MG tablet    Sig: Take 1 tablet (5 mg total) at bedtime as needed by mouth. for sleep    Dispense:  30 tablet    Refill:  2  . beclomethasone (QVAR REDIHALER) 40 MCG/ACT inhaler    Sig: Inhale 2 puffs 2 (two) times daily into the lungs.    Dispense:  2 Inhaler    Refill:  6    Durward Parcelavid McMullen, DO [facility], PGY-2 06/04/2017, 8:48 PM

## 2017-06-02 ENCOUNTER — Other Ambulatory Visit: Payer: Self-pay

## 2017-06-02 ENCOUNTER — Ambulatory Visit (INDEPENDENT_AMBULATORY_CARE_PROVIDER_SITE_OTHER): Payer: 59 | Admitting: Family Medicine

## 2017-06-02 ENCOUNTER — Other Ambulatory Visit (HOSPITAL_COMMUNITY)
Admission: RE | Admit: 2017-06-02 | Discharge: 2017-06-02 | Disposition: A | Discharge status: Home / Self Care | Payer: 59 | Source: Ambulatory Visit | Attending: Family Medicine | Admitting: Family Medicine

## 2017-06-02 ENCOUNTER — Encounter: Payer: Self-pay | Admitting: Family Medicine

## 2017-06-02 VITALS — BP 118/78 | HR 68 | Temp 98.5°F | Ht 62.0 in | Wt 178.0 lb

## 2017-06-02 DIAGNOSIS — Z23 Encounter for immunization: Secondary | ICD-10-CM

## 2017-06-02 DIAGNOSIS — Z0000 Encounter for general adult medical examination without abnormal findings: Secondary | ICD-10-CM

## 2017-06-02 DIAGNOSIS — N63 Unspecified lump in unspecified breast: Secondary | ICD-10-CM

## 2017-06-02 DIAGNOSIS — F17200 Nicotine dependence, unspecified, uncomplicated: Secondary | ICD-10-CM

## 2017-06-02 DIAGNOSIS — Z124 Encounter for screening for malignant neoplasm of cervix: Secondary | ICD-10-CM | POA: Diagnosis not present

## 2017-06-02 DIAGNOSIS — J4520 Mild intermittent asthma, uncomplicated: Secondary | ICD-10-CM

## 2017-06-02 DIAGNOSIS — G4700 Insomnia, unspecified: Secondary | ICD-10-CM

## 2017-06-02 MED ORDER — ZOLPIDEM TARTRATE 5 MG PO TABS: 5.0000 mg | ORAL_TABLET | Freq: Every evening | ORAL | 2 refills | Status: DC | PRN

## 2017-06-02 MED ORDER — BECLOMETHASONE DIPROP HFA 40 MCG/ACT IN AERB: 2.0000 | INHALATION_SPRAY | Freq: Two times a day (BID) | RESPIRATORY_TRACT | 6 refills | Status: DC

## 2017-06-02 NOTE — Patient Instructions (Signed)
Thank you for coming in to see us today. Please see below to review our plan for today's visit.  1.  You are now up-to-date on your flu shot and her Pap smear.  We will call you if your Pap smear results are abnormal.  If normal, your next Pap smear will be in 5 years. 2.  I would advise you to contact your chiropractor if you would like to be seen.  You do not need a referral by me.  It is important that he continue to exercise regularly throughout the week trying to achieve approximately 150 minutes of moderate to high intensity exercise per week. 3.  Let me know when you are ready to stop smoking.  We have resources to help you with this.  In the meantime you can call 1-800-QUIT-NOW  Please call the clinic at [phone] if your symptoms worsen or you have any concerns. It was our pleasure to serve you.  Durward Parcelavid Arlie Riker, DO [facility], PGY-2

## 2017-06-04 NOTE — Assessment & Plan Note (Signed)
Chronic.  Not interested in cessation.  Has tried nicotine supplements with 1 trial in past.  Would consider alternatives such as Chantix or Wellbutrin. - Given number to 1-800-QUIT-NOW and discussed importance of smoking cessation

## 2017-06-04 NOTE — Assessment & Plan Note (Signed)
Resolved.  Likely fibroadenoma given quick resolution.  No image or mentioned follow up.  No red flags.  Will meet criteria for mammogram screen next year. - Discussed importance of mammogram starting next year, patient requests to begin screening at age 41

## 2017-06-04 NOTE — Assessment & Plan Note (Addendum)
Chronic.  Well controlled with controller and minimal use of rescue inhaler.  Smoking. - Given refill for beclomethasone inhaler - Administered flu shot

## 2017-06-06 ENCOUNTER — Telehealth: Payer: Self-pay | Admitting: Family Medicine

## 2017-06-06 ENCOUNTER — Encounter: Payer: Self-pay | Admitting: Family Medicine

## 2017-06-06 LAB — CYTOLOGY - PAP
DIAGNOSIS: NEGATIVE
HPV (WINDOPATH): DETECTED — AB

## 2017-06-06 NOTE — Telephone Encounter (Signed)
Contacted patient regarding positive HPV with negative cytology on Pap smear.  Recommending patient return for Pap smear in 1 year for repeat co-testing.  Patient understands without questions.  Patient without further questions or concerns.  Will update chart to reflect this.  Care maintenance to reflect this.  Durward Parcelavid McMullen, DO [facility], PGY-2

## 2017-06-07 DIAGNOSIS — Z719 Counseling, unspecified: Secondary | ICD-10-CM | POA: Diagnosis not present

## 2017-06-13 NOTE — Telephone Encounter (Signed)
Pt would like Dr to call her back and discuss these results. She said she could barely hear him on the phone the first time and would like to discuss these results with him again. Please advise

## 2017-06-14 ENCOUNTER — Other Ambulatory Visit: Payer: Self-pay | Admitting: Family Medicine

## 2017-06-14 DIAGNOSIS — R87810 Cervical high risk human papillomavirus (HPV) DNA test positive: Secondary | ICD-10-CM

## 2017-06-14 NOTE — Telephone Encounter (Signed)
Patient wants proceed with colposcopy referral based on results despite guidelines recommending retest in 1 year. I feel this is okay given her concern and will place [facility]. Patient without further questions or concerns.

## 2017-06-14 NOTE — Progress Notes (Signed)
Patient wants to proceed with colposcopy referral based on results despite guidelines recommending retest in 1 year. I feel this is okay given her concern and will place [facility]. Patient without further questions or concerns. Scheduled 06/22/17 at 8:30 am.  Durward Parcelavid McMullen, DO [facility], PGY-2

## 2017-06-16 NOTE — Telephone Encounter (Signed)
Pt has appointment scheduled for this on 06/22/17. Lamonte SakaiZimmerman Rumple, April D, [place]

## 2017-06-19 ENCOUNTER — Encounter: Payer: Self-pay | Admitting: Family Medicine

## 2017-06-22 ENCOUNTER — Other Ambulatory Visit: Payer: Self-pay

## 2017-06-22 ENCOUNTER — Ambulatory Visit (INDEPENDENT_AMBULATORY_CARE_PROVIDER_SITE_OTHER): Payer: 59 | Admitting: Family Medicine

## 2017-06-22 DIAGNOSIS — R87810 Cervical high risk human papillomavirus (HPV) DNA test positive: Secondary | ICD-10-CM

## 2017-06-22 NOTE — Assessment & Plan Note (Signed)
Clinically normal colposcopy. Repeat cotest pap in one year

## 2017-06-22 NOTE — Progress Notes (Signed)
Pap 2015: negative, no hi risk HPV detected Pap 2018: negative, POSITIVE detection hi risk HPV  Asymptomatic No prior hx abnormal paps No hx cervical procedures PERTINENT  PMH / PSH: I have reviewed the patient's medications, allergies, past medical and surgical history, smoking status.  Pertinent findings that relate to today's visit / issues include: Smoker Hx benign breast mass Obesity  Patient given informed consent, signed copy in the chart.  Placed in lithotomy position. Cervix viewed with speculum and colposcope after application of acetic acid.   Colposcopy adequate (entire squamocolumnar junctions seen  in entirety) ?  yes Acetowhite lesions? none Punctation? no Mosaicism?  no Abnormal vasculature?  no Biopsies? none ECC? no Complications? no  COMMENTS:  Patient was given post procedure instructions.  .Marland Kitchen

## 2017-09-03 ENCOUNTER — Encounter: Payer: Self-pay | Admitting: Family Medicine

## 2017-09-04 ENCOUNTER — Other Ambulatory Visit: Payer: Self-pay | Admitting: *Deleted

## 2017-09-04 DIAGNOSIS — G4700 Insomnia, unspecified: Secondary | ICD-10-CM

## 2017-09-05 ENCOUNTER — Other Ambulatory Visit: Payer: Self-pay | Admitting: Family Medicine

## 2017-09-05 MED ORDER — ZOLPIDEM TARTRATE 5 MG PO TABS: 5.0000 mg | ORAL_TABLET | Freq: Every evening | ORAL | 2 refills | Status: DC | PRN

## 2017-09-05 MED ORDER — PANTOPRAZOLE SODIUM 40 MG PO TBEC
40.0000 mg | DELAYED_RELEASE_TABLET | Freq: Every day | ORAL | 0 refills | Status: DC
Start: 2017-09-05 — End: 2017-12-29

## 2017-09-05 NOTE — Progress Notes (Signed)
   Subjective   Patient ID: Jessica Hale    DOB: Jan 29, 1976, 42 y.o. female   MRN: [id number]  CC: "Rectal bleeding"  HPI: Jessica Eonatasha M Kilroy is a 42 y.o. female who presents to clinic today for the following:  Hematochezia: Onset 3 weeks ago with intermittent episodes of bright red blood per stool both on the outside of the stool and with wiping.  Patient has a history of similar episodes back in 2016 and was diagnosed with internal hemorrhoids.  She denies any pain with the passage of blood.  Patient endorses history of constipation with daily bowel movements requiring straining and excessive maceration on the toilet.  She denies family history of colorectal cancer.  Patient denies unexplained weight loss, abdominal pain, diarrhea, melena, syncope or fatigue.  ROS: see HPI for pertinent.  PMFSH: HTN, tobacco use disorder, asthma, GERD, obesity.  Surgical history knee.  Family history unremarkable. Smoking status reviewed. Medications reviewed.  Objective   BP 117/80 (BP Location: Left Arm, Patient Position: Sitting, Cuff Size: Normal)   Pulse 69   Temp 98.2 F (36.8 C) (Oral)   Wt 187 lb 3.2 oz (84.9 kg)   LMP 08/21/2017 (Approximate)   SpO2 99%   BMI 34.24 kg/m  Vitals and nursing note reviewed.  General: well nourished, well developed, NAD with non-toxic appearance HEENT: normocephalic, atraumatic, moist mucous membranes Cardiovascular: regular rate and rhythm without murmurs, rubs, or gallops Lungs: clear to auscultation bilaterally with normal work of breathing Abdomen: soft, non-tender, non-distended, normoactive bowel sounds Rectal: Chaperone present, no external hemorrhoids or tags evident, unable to identify internal hemorrhoid on anoscopy, appropriate anal sphincter tone, absent stool in rectal vault, no masses identified, no gross blood per rectum Skin: warm, dry, no rashes or lesions, cap refill < 2 seconds Extremities: warm and well perfused, normal tone, no  edema  Assessment & Plan   Hematochezia Acute.  Prior history of similar episodes diagnosed as internal hemorrhoids.  No red flags.  Likely internal hemorrhoids given nonpainful hematochezia.  Suspect underlying constipation may be exacerbating hemorrhoids.  Negative FOBT.  Patient without symptoms of anemia. - Advised patient to try over-the-counter Metamucil daily along with MiraLAX as needed until daily bowel movements require less straining - Reviewed return precautions  No orders of the defined types were placed in this encounter.  No orders of the defined types were placed in this encounter.   Durward Parcelavid McMullen, DO [facility], PGY-2 09/06/2017, 4:31 PM

## 2017-09-06 ENCOUNTER — Encounter: Payer: Self-pay | Admitting: Family Medicine

## 2017-09-06 ENCOUNTER — Ambulatory Visit: Payer: 59 | Admitting: Family Medicine

## 2017-09-06 ENCOUNTER — Other Ambulatory Visit: Payer: Self-pay | Admitting: Family Medicine

## 2017-09-06 VITALS — BP 117/80 | HR 69 | Temp 98.2°F | Wt 187.2 lb

## 2017-09-06 DIAGNOSIS — G4700 Insomnia, unspecified: Secondary | ICD-10-CM

## 2017-09-06 DIAGNOSIS — K921 Melena: Secondary | ICD-10-CM

## 2017-09-06 MED ORDER — ZOLPIDEM TARTRATE 5 MG PO TABS: 5.0000 mg | ORAL_TABLET | Freq: Every evening | ORAL | 2 refills | Status: DC | PRN

## 2017-09-06 NOTE — Patient Instructions (Signed)
Thank you for coming in to see us today. Please see below to review our plan for today's visit.  Your bleeding is likely from internal hemorrhoids.  These can bleed with irritation usually due to constipation.  To help treat the constipation, we recommend over-the-counter Metamucil daily (a fiber supplement).  You can also take over-the-counter MiraLAX and add the powder formula to your favorite drink and titrate until your bowel movements require less straining.  Please call the clinic at [phone] if your symptoms worsen or you have any concerns. It was our pleasure to serve you.  Durward Parcelavid McMullen, DO [facility], PGY-2   Hemorrhoids Hemorrhoids are swollen veins in and around the rectum or anus. Hemorrhoids can cause pain, itching, or bleeding. Most of the time, they do not cause serious problems. They usually get better with diet changes, lifestyle changes, and other home treatments. Follow these instructions at home: Eating and drinking  Eat foods that have fiber, such as whole grains, beans, nuts, fruits, and vegetables. Ask your doctor about taking products that have added fiber (fibersupplements).  Drink enough fluid to keep your pee (urine) clear or pale yellow. For Pain and Swelling  Take a warm-water bath (sitz bath) for 20 minutes to ease pain. Do this 3-4 times a day.  If directed, put ice on the painful area. It may be helpful to use ice between your warm baths. ? Put ice in a plastic bag. ? Place a towel between your skin and the bag. ? Leave the ice on for 20 minutes, 2-3 times a day. General instructions  Take over-the-counter and prescription medicines only as told by your doctor. ? Medicated creams and medicines that are inserted into the anus (suppositories) may be used or applied as told.  Exercise often.  Go to the bathroom when you have the urge to poop (to have a bowel movement). Do not wait.  Avoid pushing too hard (straining) when you  poop.  Keep the butt area dry and clean. Use wet toilet paper or moist paper towels.  Do not sit on the toilet for a long time. Contact a doctor if:  You have any of these: ? Pain and swelling that do not get better with treatment or medicine. ? Bleeding that will not stop. ? Trouble pooping or you cannot poop. ? Pain or swelling outside the area of the hemorrhoids. This information is not intended to replace advice given to you by your health care provider. Make sure you discuss any questions you have with your health care provider. Document Released: 04/12/2008 Document Revised: 12/10/2015 Document Reviewed: 03/18/2015 Elsevier Interactive Patient Education  [employer]2018 [employer]

## 2017-09-06 NOTE — Assessment & Plan Note (Addendum)
Acute.  Prior history of similar episodes diagnosed as internal hemorrhoids.  No red flags.  Likely internal hemorrhoids given nonpainful hematochezia.  Suspect underlying constipation may be exacerbating hemorrhoids.  Negative FOBT.  Patient without symptoms of anemia. - Advised patient to try over-the-counter Metamucil daily along with MiraLAX as needed until daily bowel movements require less straining - Reviewed return precautions

## 2017-09-11 ENCOUNTER — Encounter: Payer: Self-pay | Admitting: Family Medicine

## 2017-09-28 DIAGNOSIS — M545 Low back pain: Secondary | ICD-10-CM | POA: Diagnosis not present

## 2017-09-28 DIAGNOSIS — M2560 Stiffness of unspecified joint, not elsewhere classified: Secondary | ICD-10-CM | POA: Diagnosis not present

## 2017-09-28 DIAGNOSIS — R293 Abnormal posture: Secondary | ICD-10-CM | POA: Diagnosis not present

## 2017-11-07 ENCOUNTER — Ambulatory Visit: Payer: 59 | Admitting: Family Medicine

## 2017-11-30 ENCOUNTER — Other Ambulatory Visit: Payer: Self-pay

## 2017-11-30 DIAGNOSIS — G4700 Insomnia, unspecified: Secondary | ICD-10-CM

## 2017-11-30 MED ORDER — ZOLPIDEM TARTRATE 5 MG PO TABS: 5.0000 mg | ORAL_TABLET | Freq: Every evening | ORAL | 2 refills | Status: DC | PRN

## 2017-12-03 ENCOUNTER — Encounter: Payer: Self-pay | Admitting: Family Medicine

## 2017-12-28 NOTE — Progress Notes (Signed)
Jessica Hale is a 42 y.o. female [facility]   Patient Care Team: Helane RimaWallace, Damarious Holtsclaw, DO as PCP - General (Family Medicine)   History of Present Illness:   Jessica Hale, CMA acting as scribe for Dr. Helane RimaErica Kenji Mapel.   HPI: Here to establish care. Has been looking for Dr. Earlene Hale after seeing during residency. She had been seen at residency for several years but wanted to get a more permanent PCP.   Back pain and neck pain: On and off for abut 5 yrs. No injury but very physically demanding job. No problems with bowel or incontinence. Back pain goes into right leg down front shin to top of foot. She has numbness and tingling when it happens. Neck pain is similar in feeling goes into right arm to hand. She has tried heat, ice, ibuprofen, and has seen chiropractor. All have only provided little relief. Chiropractor told her she has scoliosis. She had treatment for 4 weeks twice a week. She felt like that help more than other treatments she has had. She had xray about 3 months ago before she started treatment.   Blood in stools: Evaluation by PCP in the past and told that it was hemorrhoids. She only has small amounts off and on. Was given instructions to take miralax and stool softer. She feel like that it help little. She is still having some heartburn. Stopped PPI months ago due to PCP instructions.  She has history of asthma and smoking. It is controlled with medication and does not have to use rescue inhaler often at all.   There are no preventive care reminders to display for this patient.   Depression screen PHQ 2/9 12/29/2017  Decreased Interest 0  Down, Depressed, Hopeless 0  PHQ - 2 Score 0  Altered sleeping -  Tired, decreased energy -  Change in appetite -  Feeling bad or failure about yourself  -  Trouble concentrating -  Moving slowly or fidgety/restless -  Suicidal thoughts -  PHQ-9 Score -    PMHx, SurgHx, SocialHx, Medications, and Allergies were  reviewed in the Visit Navigator and updated as appropriate.   Past Medical History:  Diagnosis Date  . Asthma   . Hypertension   . Migraine headache      Past Surgical History:  Procedure Laterality Date  . KNEE SURGERY     Age 42     Family History  Problem Relation Age of Onset  . Heart attack Maternal Grandmother   . Hypertension Maternal Grandmother     Social History   Tobacco Use  . Smoking status: Current Every Day Smoker    Packs/day: 0.25    Years: 5.00    Pack years: 1.25    Types: Cigarettes  . Smokeless tobacco: Never Used  Substance Use Topics  . Alcohol use: Yes    Alcohol/week: 0.0 oz    Comment: occasional  . Drug use: Not Currently    Types: Marijuana    Current Medications and Allergies:   .  albuterol (VENTOLIN HFA) 108 (90 Base) MCG/ACT inhaler, Inhale 2 puffs into the lungs every 6 (six) hours as needed for wheezing or shortness of breath., Disp: 1 Inhaler, Rfl: 1 .  beclomethasone (QVAR REDIHALER) 40 MCG/ACT inhaler, Inhale 2 puffs 2 (two) times daily into the lungs., Disp: 2 Inhaler, Rfl: 6 .  EPIPEN 2-PAK 0.3 MG/0.3ML SOAJ injection, inject as directed WITH BEE STING, Disp: , Rfl: 0 .  hydrochlorothiazide (HYDRODIURIL) 25 MG  tablet, Take 0.5 tablets (12.5 mg total) by mouth daily., Disp: 30 tablet, Rfl: 11 .  metoprolol tartrate (LOPRESSOR) 25 MG tablet, Take 0.5 tablets (12.5 mg total) by mouth 2 (two) times daily., Disp: 60 tablet, Rfl: 11 .  zolpidem (AMBIEN) 5 MG tablet, Take 1 tablet (5 mg total) by mouth at bedtime as needed. for sleep, Disp: 30 tablet, Rfl: 2   Allergies  Allergen Reactions  . Bee Venom Anaphylaxis   Review of Systems:   Pertinent items are noted in the HPI. Otherwise, ROS is negative.  Vitals:   Vitals:   12/29/17 1321  BP: 120/78  Pulse: 80  Temp: 98.6 F (37 C)  TempSrc: Oral  SpO2: 99%  Weight: 171 lb (77.6 kg)  Height: 5\' 2"  (1.575 m)     Body mass index is 31.28 kg/m.  Physical Exam:    Physical Exam  Constitutional: She is oriented to person, place, and time. She appears well-developed and well-nourished. No distress.  HENT:  Head: Normocephalic and atraumatic.  Right Ear: External ear normal.  Left Ear: External ear normal.  Nose: Nose normal.  Mouth/Throat: Oropharynx is clear and moist.  Eyes: Pupils are equal, round, and reactive to light. Conjunctivae and EOM are normal.  Neck: Normal range of motion. Neck supple. No thyromegaly present.  Cardiovascular: Normal rate, regular rhythm, normal heart sounds and intact distal pulses.  Pulmonary/Chest: Effort normal and breath sounds normal.  Abdominal: Soft. Bowel sounds are normal.  Musculoskeletal: Normal range of motion.       Cervical back: She exhibits spasm.       Back:  Right periscapular ttp. FROM cervical and lumbar spines. FROM UE with normal strength, reflexes, DP, and sensation.  Lymphadenopathy:    She has no cervical adenopathy.  Neurological: She is alert and oriented to person, place, and time.  Skin: Skin is warm and dry. Capillary refill takes less than 2 seconds.  Psychiatric: She has a normal mood and affect. Her behavior is normal.  Nursing note and vitals reviewed.  Results for orders placed or performed in visit on 12/29/17  CBC with Differential/Platelet  Result Value Ref Range   WBC 6.6 4.0 - 10.5 K/uL   RBC 4.42 3.87 - 5.11 Mil/uL   Hemoglobin 11.1 (L) 12.0 - 15.0 g/dL   HCT 98.1 (L) 19.1 - 47.8 %   MCV 77.9 (L) 78.0 - 100.0 fl   MCHC 32.1 30.0 - 36.0 g/dL   RDW 29.5 (H) 62.1 - 30.8 %   Platelets 382.0 150.0 - 400.0 K/uL   Neutrophils Relative % 51.0 43.0 - 77.0 %   Lymphocytes Relative 33.5 12.0 - 46.0 %   Monocytes Relative 6.8 3.0 - 12.0 %   Eosinophils Relative 7.6 (H) 0.0 - 5.0 %   Basophils Relative 1.1 0.0 - 3.0 %   Neutro Abs 3.4 1.4 - 7.7 K/uL   Lymphs Abs 2.2 0.7 - 4.0 K/uL   Monocytes Absolute 0.5 0.1 - 1.0 K/uL   Eosinophils Absolute 0.5 0.0 - 0.7 K/uL    Basophils Absolute 0.1 0.0 - 0.1 K/uL  Comprehensive metabolic panel  Result Value Ref Range   Sodium 138 135 - 145 mEq/L   Potassium 3.8 3.5 - 5.1 mEq/L   Chloride 107 96 - 112 mEq/L   CO2 23 19 - 32 mEq/L   Glucose, Bld 109 (H) 70 - 99 mg/dL   BUN 17 6 - 23 mg/dL   Creatinine, Ser 6.57 0.40 - 1.20 mg/dL  Total Bilirubin 0.3 0.2 - 1.2 mg/dL   Alkaline Phosphatase 41 39 - 117 U/L   AST 14 0 - 37 U/L   ALT 17 0 - 35 U/L   Total Protein 7.4 6.0 - 8.3 g/dL   Albumin 4.5 3.5 - 5.2 g/dL   Calcium 9.7 8.4 - 16.1 mg/dL   GFR 09.60 >45.40 mL/min  Lipid panel  Result Value Ref Range   Cholesterol 185 0 - 200 mg/dL   Triglycerides 981.1 0.0 - 149.0 mg/dL   HDL 91.47 >82.95 mg/dL   VLDL 62.1 0.0 - 30.8 mg/dL   LDL Cholesterol 657 (H) 0 - 99 mg/dL   Total CHOL/HDL Ratio 3    NonHDL 131.17   TSH  Result Value Ref Range   TSH 2.04 0.35 - 4.50 uIU/mL  Lipase  Result Value Ref Range   Lipase 38.0 11.0 - 59.0 U/L  Vitamin B12  Result Value Ref Range   Vitamin B-12 672 211 - 911 pg/mL  H. pylori antibody, IgG  Result Value Ref Range   H Pylori IgG Negative Negative   Assessment and Plan:   Shakhia was seen today for establish care, back pain and neck pain.  Diagnoses and all orders for this visit:  Weight loss -     CBC with Differential/Platelet -     Comprehensive metabolic panel -     Lipid panel -     TSH -     Lipase -     Vitamin B12  Mild intermittent asthma without complication Comments: Controlled. Orders: -     albuterol (VENTOLIN HFA) 108 (90 Base) MCG/ACT inhaler; Inhale 2 puffs into the lungs every 6 (six) hours as needed for wheezing or shortness of breath. -     beclomethasone (QVAR REDIHALER) 40 MCG/ACT inhaler; Inhale 2 puffs into the lungs 2 (two) times daily.  Benign essential HTN Comments: Controlled. Orders: -     hydrochlorothiazide (HYDRODIURIL) 25 MG tablet; Take 0.5 tablets (12.5 mg total) by mouth daily. -     metoprolol tartrate (LOPRESSOR)  25 MG tablet; Take 0.5 tablets (12.5 mg total) by mouth 2 (two) times daily.  Gastroesophageal reflux disease, esophagitis presence not specified Comments: Restart PPI. EGD. Orders: -     H. pylori antibody, IgG -     Ambulatory referral to Gastroenterology  Chronic periscapular pain on right side Comments: Intermittent. Works job in [employer]. Orders: -     meloxicam (MOBIC) 7.5 MG tablet; Take 1 tablet (7.5 mg total) by mouth daily.  Tobacco use disorder Comments: 1/2 PPD. No THC. I advised patient to quit smoking, and offered support. [place] QUITLINE: 1-800-QUIT-NOW [phone]).  Chronic right-sided low back pain without sciatica Comments: No red flags. Will obtain previous XRAYs - done a few weeks ago. Okay Mobic. Orders: -     meloxicam (MOBIC) 7.5 MG tablet; Take 1 tablet (7.5 mg total) by mouth daily.  Psychophysiological insomnia Comments: Uses Ambien sparingly.  Papanicolaou smear of cervix with positive high risk human papilloma virus (HPV) test Comments: Due for retest in the fall.  Iron deficiency anemia due to chronic blood loss Comments: Will instruct patient to start iron supplement and recheck in a few months.     . Reviewed expectations re: course of current medical issues. . Discussed self-management of symptoms. . Outlined signs and symptoms indicating need for more acute intervention. . Patient verbalized understanding and all questions were answered. Marland Kitchen Health Maintenance issues including appropriate healthy diet, exercise, and smoking  avoidance were discussed with patient. . See orders for this visit as documented in the electronic medical record. . Patient received an After Visit Summary.  CMA served as Neurosurgeon during this visit. History, Physical, and Plan performed by medical provider. The above documentation has been reviewed and is accurate and complete. Helane Rima, D.O.  Helane Rima, DO [place], [facility] 01/01/2018

## 2017-12-29 ENCOUNTER — Encounter: Payer: Self-pay | Admitting: Internal Medicine

## 2017-12-29 ENCOUNTER — Ambulatory Visit: Payer: 59 | Admitting: Family Medicine

## 2017-12-29 ENCOUNTER — Encounter: Payer: Self-pay | Admitting: Family Medicine

## 2017-12-29 VITALS — BP 120/78 | HR 80 | Temp 98.6°F | Ht 62.0 in | Wt 171.0 lb

## 2017-12-29 DIAGNOSIS — D500 Iron deficiency anemia secondary to blood loss (chronic): Secondary | ICD-10-CM | POA: Diagnosis not present

## 2017-12-29 DIAGNOSIS — F17200 Nicotine dependence, unspecified, uncomplicated: Secondary | ICD-10-CM

## 2017-12-29 DIAGNOSIS — M25511 Pain in right shoulder: Secondary | ICD-10-CM

## 2017-12-29 DIAGNOSIS — G8929 Other chronic pain: Secondary | ICD-10-CM | POA: Diagnosis not present

## 2017-12-29 DIAGNOSIS — I10 Essential (primary) hypertension: Secondary | ICD-10-CM | POA: Diagnosis not present

## 2017-12-29 DIAGNOSIS — R87810 Cervical high risk human papillomavirus (HPV) DNA test positive: Secondary | ICD-10-CM

## 2017-12-29 DIAGNOSIS — K219 Gastro-esophageal reflux disease without esophagitis: Secondary | ICD-10-CM | POA: Diagnosis not present

## 2017-12-29 DIAGNOSIS — F5104 Psychophysiologic insomnia: Secondary | ICD-10-CM | POA: Diagnosis not present

## 2017-12-29 DIAGNOSIS — M545 Low back pain, unspecified: Secondary | ICD-10-CM

## 2017-12-29 DIAGNOSIS — R634 Abnormal weight loss: Secondary | ICD-10-CM

## 2017-12-29 DIAGNOSIS — J4520 Mild intermittent asthma, uncomplicated: Secondary | ICD-10-CM | POA: Diagnosis not present

## 2017-12-29 LAB — COMPREHENSIVE METABOLIC PANEL
ALT: 17 U/L (ref 0–35)
AST: 14 U/L (ref 0–37)
Albumin: 4.5 g/dL (ref 3.5–5.2)
Alkaline Phosphatase: 41 U/L (ref 39–117)
BUN: 17 mg/dL (ref 6–23)
CO2: 23 mEq/L (ref 19–32)
Calcium: 9.7 mg/dL (ref 8.4–10.5)
Chloride: 107 mEq/L (ref 96–112)
Creatinine, Ser: 1.02 mg/dL (ref 0.40–1.20)
GFR: 77.32 mL/min (ref >60.00)
Glucose, Bld: 109 mg/dL — ABNORMAL HIGH (ref 70–99)
Potassium: 3.8 mEq/L (ref 3.5–5.1)
Sodium: 138 mEq/L (ref 135–145)
Total Bilirubin: 0.3 mg/dL (ref 0.2–1.2)
Total Protein: 7.4 g/dL (ref 6.0–8.3)

## 2017-12-29 LAB — CBC WITH DIFFERENTIAL/PLATELET
Basophils Absolute: 0.1 10*3/uL (ref 0.0–0.1)
Basophils Relative: 1.1 % (ref 0.0–3.0)
Eosinophils Absolute: 0.5 10*3/uL (ref 0.0–0.7)
Eosinophils Relative: 7.6 % — ABNORMAL HIGH (ref 0.0–5.0)
HCT: 34.4 % — ABNORMAL LOW (ref 36.0–46.0)
Hemoglobin: 11.1 g/dL — ABNORMAL LOW (ref 12.0–15.0)
Lymphocytes Relative: 33.5 % (ref 12.0–46.0)
Lymphs Abs: 2.2 10*3/uL (ref 0.7–4.0)
MCHC: 32.1 g/dL (ref 30.0–36.0)
MCV: 77.9 fl — ABNORMAL LOW (ref 78.0–100.0)
Monocytes Absolute: 0.5 10*3/uL (ref 0.1–1.0)
Monocytes Relative: 6.8 % (ref 3.0–12.0)
Neutro Abs: 3.4 10*3/uL (ref 1.4–7.7)
Neutrophils Relative %: 51 % (ref 43.0–77.0)
Platelets: 382 10*3/uL (ref 150.0–400.0)
RBC: 4.42 Mil/uL (ref 3.87–5.11)
RDW: 17.4 % — ABNORMAL HIGH (ref 11.5–15.5)
WBC: 6.6 10*3/uL (ref 4.0–10.5)

## 2017-12-29 LAB — LIPID PANEL
Cholesterol: 185 mg/dL (ref 0–200)
HDL: 53.4 mg/dL (ref >39.00)
LDL Cholesterol: 108 mg/dL — ABNORMAL HIGH (ref 0–99)
NonHDL: 131.17
Total CHOL/HDL Ratio: 3
Triglycerides: 114 mg/dL (ref 0.0–149.0)
VLDL: 22.8 mg/dL (ref 0.0–40.0)

## 2017-12-29 LAB — LIPASE: Lipase: 38 U/L (ref 11.0–59.0)

## 2017-12-29 LAB — VITAMIN B12: Vitamin B-12: 672 pg/mL (ref 211–911)

## 2017-12-29 LAB — H. PYLORI ANTIBODY, IGG: H Pylori IgG: NEGATIVE

## 2017-12-29 LAB — TSH: TSH: 2.04 u[IU]/mL (ref 0.35–4.50)

## 2017-12-29 MED ORDER — METOPROLOL TARTRATE 25 MG PO TABS: 12.5000 mg | ORAL_TABLET | Freq: Two times a day (BID) | ORAL | 11 refills | Status: DC

## 2017-12-29 MED ORDER — ALBUTEROL SULFATE HFA 108 (90 BASE) MCG/ACT IN AERS
2.0000 | INHALATION_SPRAY | Freq: Four times a day (QID) | RESPIRATORY_TRACT | 1 refills | Status: DC | PRN
Start: 2017-12-29 — End: 2019-04-11

## 2017-12-29 MED ORDER — MELOXICAM 7.5 MG PO TABS: 7.5000 mg | ORAL_TABLET | Freq: Every day | ORAL | 0 refills | Status: DC

## 2017-12-29 MED ORDER — BECLOMETHASONE DIPROP HFA 40 MCG/ACT IN AERB: 2.0000 | INHALATION_SPRAY | Freq: Two times a day (BID) | RESPIRATORY_TRACT | 6 refills | Status: DC

## 2017-12-29 MED ORDER — HYDROCHLOROTHIAZIDE 25 MG PO TABS: 12.5000 mg | ORAL_TABLET | Freq: Every day | ORAL | 11 refills | Status: DC

## 2018-01-01 ENCOUNTER — Encounter: Payer: Self-pay | Admitting: Family Medicine

## 2018-01-01 DIAGNOSIS — G8929 Other chronic pain: Secondary | ICD-10-CM | POA: Insufficient documentation

## 2018-01-01 DIAGNOSIS — M25511 Pain in right shoulder: Secondary | ICD-10-CM

## 2018-01-02 ENCOUNTER — Other Ambulatory Visit: Payer: Self-pay

## 2018-01-04 ENCOUNTER — Other Ambulatory Visit: Payer: Self-pay

## 2018-01-04 MED ORDER — CYCLOBENZAPRINE HCL 5 MG PO TABS: 5.0000 mg | ORAL_TABLET | Freq: Every day | ORAL | 0 refills | Status: DC

## 2018-01-19 ENCOUNTER — Encounter: Payer: Self-pay | Admitting: Family Medicine

## 2018-01-22 MED ORDER — OMEPRAZOLE 40 MG PO CPDR: 40.0000 mg | DELAYED_RELEASE_CAPSULE | Freq: Every day | ORAL | 3 refills | Status: DC

## 2018-01-26 ENCOUNTER — Other Ambulatory Visit: Payer: Self-pay | Admitting: Family Medicine

## 2018-01-26 DIAGNOSIS — M25511 Pain in right shoulder: Principal | ICD-10-CM

## 2018-01-26 DIAGNOSIS — M545 Low back pain, unspecified: Secondary | ICD-10-CM

## 2018-01-26 DIAGNOSIS — G8929 Other chronic pain: Secondary | ICD-10-CM

## 2018-02-08 ENCOUNTER — Encounter: Payer: Self-pay | Admitting: Family Medicine

## 2018-02-12 ENCOUNTER — Telehealth: Payer: Self-pay | Admitting: Family Medicine

## 2018-02-12 NOTE — Telephone Encounter (Signed)
Copied from CRM [id number]. Topic: Quick Communication - See Telephone Encounter >> Feb 12, 2018  4:27 PM Herby AbrahamJohnson, Shiquita C wrote: CRM for notification. See Telephone encounter for: 02/12/18.   Pt called back in to be advised. She said that she spoke with Marland KitchenJo Ellen and is waiting for a call back to be advised. I did advise per message (response from PCP) pt would also like to know if provider was able to get her X-Ray images from chiropractor that she has been seeing?   Please advise.

## 2018-02-13 ENCOUNTER — Encounter: Payer: Self-pay | Admitting: Family Medicine

## 2018-02-13 NOTE — Telephone Encounter (Signed)
sent my chart message to address

## 2018-02-15 NOTE — Progress Notes (Signed)
Jessica Hale is a 42 y.o. female is here for follow up.  History of Present Illness:   Jessica Hale, CMA acting as scribe for Dr. Helane RimaErica Travas Hale.   RUE:[id number]:Patient having back and right more than left pain. She is also having neck and right more than left arm in to hand pain. Today she has taken the Mobic 7.5 mg around eight this morning. She can not tell any improvement with it. Her pain is still a 5/10.   Health Maintenance Due  Topic Date Due  . INFLUENZA VACCINE  02/15/2018   [facility] 12/29/2017 06/22/2017 06/02/2017  Decreased Interest 0 0 0  Down, Depressed, Hopeless 0 0 0  PHQ - 2 Score 0 0 0  Altered sleeping - - -  Tired, decreased energy - - -  Change in appetite - - -  Feeling bad or failure about yourself  - - -  Trouble concentrating - - -  Moving slowly or fidgety/restless - - -  Suicidal thoughts - - -  PHQ-9 Score - - -   PMHx, SurgHx, SocialHx, FamHx, Medications, and Allergies were reviewed in the Visit Navigator and updated as appropriate.   Patient Active Problem List   Diagnosis Date Noted  . Chronic periscapular pain on right side 01/01/2018  . Hematochezia 09/06/2017  . Obesity (BMI 30.0-34.9) 09/17/2015  . Breast mass in female 03/20/2015  . Chronic right-sided low back pain without sciatica 08/01/2014  . Palpitations 03/06/2014  . Meniere's disease (cochlear hydrops) 01/25/2013  . Allergic rhinitis 04/10/2012  . Atypical migraine 05/02/2011  . Tobacco use disorder 03/09/2010  . Papanicolaou smear of cervix with positive high risk human papilloma virus (HPV) test 12/06/2007  . GERD 10/16/2007  . Benign essential HTN 02/07/2007  . Mild intermittent asthma 02/07/2007   Social History   Tobacco Use  . Smoking status: Current Every Day Smoker    Packs/day: 0.25    Years: 5.00    Pack years: 1.25    Types: Cigarettes  . Smokeless tobacco: Never Used  Substance Use Topics  . Alcohol use: Yes    Alcohol/week: 0.0 oz   Comment: occasional  . Drug use: Not Currently    Types: Marijuana   Current Medications and Allergies:   .  albuterol (VENTOLIN HFA) 108 (90 Base) MCG/ACT inhaler, Inhale 2 puffs into the lungs every 6 (six) hours as needed for wheezing or shortness of breath., Disp: 1 Inhaler, Rfl: 1 .  beclomethasone (QVAR REDIHALER) 40 MCG/ACT inhaler, Inhale 2 puffs into the lungs 2 (two) times daily., Disp: 2 Inhaler, Rfl: 6 .  cyclobenzaprine (FLEXERIL) 5 MG tablet, Take 1 tablet (5 mg total) by mouth at bedtime., Disp: 30 tablet, Rfl: 0 .  EPIPEN 2-PAK 0.3 MG/0.3ML SOAJ injection, inject as directed WITH BEE STING, Disp: , Rfl: 0 .  hydrochlorothiazide (HYDRODIURIL) 25 MG tablet, Take 0.5 tablets (12.5 mg total) by mouth daily., Disp: 30 tablet, Rfl: 11 .  meloxicam (MOBIC) 7.5 MG tablet, TAKE 1 TABLET(7.5 MG) BY MOUTH DAILY, Disp: 30 tablet, Rfl: 0 .  metoprolol tartrate (LOPRESSOR) 25 MG tablet, Take 0.5 tablets (12.5 mg total) by mouth 2 (two) times daily., Disp: 60 tablet, Rfl: 11 .  omeprazole (PRILOSEC) 40 MG capsule, Take 1 capsule (40 mg total) by mouth daily., Disp: 30 capsule, Rfl: 3 .  zolpidem (AMBIEN) 5 MG tablet, Take 1 tablet (5 mg total) by mouth at bedtime as needed. for sleep, Disp: 30 tablet, Rfl: 2  Allergies  Allergen Reactions  . Bee Venom Anaphylaxis   Review of Systems   Pertinent items are noted in the HPI. Otherwise, ROS is negative.  Vitals:   Vitals:   02/16/18 1112  BP: 120/74  Pulse: 99  Temp: 98.6 F (37 C)  TempSrc: Oral  SpO2: 99%  Weight: 169 lb 12.8 oz (77 kg)  Height: 5\' 2"  (1.575 m)     Body mass index is 31.06 kg/m.  Physical Exam:   Physical Exam  Constitutional: She appears well-nourished.  HENT:  Head: Normocephalic and atraumatic.  Eyes: Pupils are equal, round, and reactive to light. EOM are normal.  Neck: Normal range of motion. Neck supple.  Cardiovascular: Normal rate, regular rhythm, normal heart sounds and intact distal pulses.    Pulmonary/Chest: Effort normal.  Abdominal: Soft.  Musculoskeletal:       Cervical back: She exhibits decreased range of motion, bony tenderness and spasm.       Lumbar back: She exhibits decreased range of motion, bony tenderness and spasm.  Skin: Skin is warm.  Psychiatric: She has a normal mood and affect. Her behavior is normal.  Nursing note and vitals reviewed.  Assessment and Plan:   Jessica Hale was seen today for follow-up.  Diagnoses and all orders for this visit:  Neck pain, chronic -     MR Cervical Spine Wo Contrast -     methylPREDNISolone acetate (DEPO-MEDROL) injection 80 mg  Chronic periscapular pain on right side -     methylPREDNISolone acetate (DEPO-MEDROL) injection 80 mg  Radicular pain in right arm -     gabapentin (NEURONTIN) 100 MG capsule; Take 1 capsule (100 mg total) by mouth 3 (three) times daily. -     ketorolac (TORADOL) 10 MG tablet; Take 1 tablet (10 mg total) by mouth every 6 (six) hours as needed. -     MR Cervical Spine Wo Contrast -     methylPREDNISolone acetate (DEPO-MEDROL) injection 80 mg    . Reviewed expectations re: course of current medical issues. . Discussed self-management of symptoms. . Outlined signs and symptoms indicating need for more acute intervention. . Patient verbalized understanding and all questions were answered. Jessica Hale Health Maintenance issues including appropriate healthy diet, exercise, and smoking avoidance were discussed with patient. . See orders for this visit as documented in the electronic medical record. . Patient received an After Visit Summary.  Jessica Rima, DO [place], [facility] 02/17/2018  Future Appointments  Date Time Provider Department Center  03/01/2018  8:45 AM Hilarie Fredrickson, MD [facility]  04/06/2018  9:00 AM Jessica Rima, DO [facility]   CMA served as scribe during this visit. History, Physical, and Plan performed by medical provider. The above documentation has been reviewed and  is accurate and complete. Jessica Hale, D.O.

## 2018-02-16 ENCOUNTER — Encounter: Payer: Self-pay | Admitting: Family Medicine

## 2018-02-16 ENCOUNTER — Ambulatory Visit: Payer: 59 | Admitting: Family Medicine

## 2018-02-16 VITALS — BP 120/74 | HR 99 | Temp 98.6°F | Ht 62.0 in | Wt 169.8 lb

## 2018-02-16 DIAGNOSIS — M25511 Pain in right shoulder: Secondary | ICD-10-CM | POA: Diagnosis not present

## 2018-02-16 DIAGNOSIS — M542 Cervicalgia: Secondary | ICD-10-CM | POA: Diagnosis not present

## 2018-02-16 DIAGNOSIS — G8929 Other chronic pain: Secondary | ICD-10-CM | POA: Diagnosis not present

## 2018-02-16 DIAGNOSIS — M792 Neuralgia and neuritis, unspecified: Secondary | ICD-10-CM | POA: Diagnosis not present

## 2018-02-16 MED ORDER — METHYLPREDNISOLONE ACETATE 80 MG/ML IJ SUSP
80.0000 mg | Freq: Once | INTRAMUSCULAR | Status: AC
  Administered 2018-02-16: 80 mg via INTRAMUSCULAR

## 2018-02-16 MED ORDER — GABAPENTIN 100 MG PO CAPS: 100.0000 mg | ORAL_CAPSULE | Freq: Three times a day (TID) | ORAL | 3 refills | Status: DC

## 2018-02-16 MED ORDER — KETOROLAC TROMETHAMINE 10 MG PO TABS: 10.0000 mg | ORAL_TABLET | Freq: Four times a day (QID) | ORAL | 0 refills | Status: DC | PRN

## 2018-02-28 ENCOUNTER — Encounter: Payer: Self-pay | Admitting: Family Medicine

## 2018-03-01 ENCOUNTER — Encounter: Payer: Self-pay | Admitting: Internal Medicine

## 2018-03-01 ENCOUNTER — Ambulatory Visit: Payer: 59 | Admitting: Internal Medicine

## 2018-03-01 VITALS — BP 124/84 | HR 72 | Ht 62.0 in | Wt 169.0 lb

## 2018-03-01 DIAGNOSIS — M5010 Cervical disc disorder with radiculopathy, unspecified cervical region: Secondary | ICD-10-CM

## 2018-03-01 DIAGNOSIS — D509 Iron deficiency anemia, unspecified: Secondary | ICD-10-CM | POA: Diagnosis not present

## 2018-03-01 DIAGNOSIS — K219 Gastro-esophageal reflux disease without esophagitis: Secondary | ICD-10-CM

## 2018-03-01 DIAGNOSIS — K625 Hemorrhage of anus and rectum: Secondary | ICD-10-CM

## 2018-03-01 DIAGNOSIS — R1030 Lower abdominal pain, unspecified: Secondary | ICD-10-CM

## 2018-03-01 MED ORDER — PEG-KCL-NACL-NASULF-NA ASC-C 140 G PO SOLR: 1.0000 | Freq: Once | ORAL | 0 refills | Status: AC

## 2018-03-01 NOTE — Telephone Encounter (Signed)
Did I need to put order in for injection or did you want to get MRI first?

## 2018-03-01 NOTE — Progress Notes (Signed)
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 Earlene PlaterWallace regarding a chief complaint of neck and shoulder pain possibly related to reflux. Other issues include iron deficiency anemia, rectal bleeding, and lower abdominal pain. The patient tells me that she has long-standing GERD as manifested by pyrosis with regurgitation. She has been on omeprazole 40 mg daily for about 5 years. She tells me that the medication controls her symptoms nicely with rare breakthrough. One of her providers did try to take her off of her PPI at one, but this resulted in intolerable reflux symptoms in addition to nausea and vomiting. Resumption of medication resulted in resolution of this issue. She denies dysphagia. Over the past year she reports problems with pain in the right side of her neck with discomfort radiating to the right shoulder and right upper back. Occasionally numbness in the right arm. She wondered if this was related to her reflux. Symptoms can be exacerbated by activity. Next, the patient was noted to be anemic. CBC from 12/29/2017 reveals hemoglobin 11.1 with an MCV of 77.9. She denies a prior history of anemia. She does have regular monthly menstrual periods. She does not donate blood. She has had 6 or 7 month history of intermittent rectal bleeding with blood admixed with stool. It was suggested that she had hemorrhoids, though this was not confirmed with physical exam or anoscopy. She has also had intermittent lower abdominal cramping discomfort. Historically her bowels alternated though in the past 6 months more of a tendency toward constipation. No rectal pain. She has had no significant change in weight. She did undergo a CT scan of the abdomen and pelvis in 2012 to evaluate right-sided pain. This was negative. She does smoke and uses alcohol on a daily basis. No significant NSAID useother  relevant blood tests include negative Helicobacter pylori antibody, normal lipase, normal B12 level, normal TSH.Marland Kitchen.  REVIEW OF SYSTEMS:  All non-GI ROS negative unless otherwise stated in the history of present illness except for fatigue, back pain, headaches, irregular heart rate, muscle cramps, sleeping problems, ankle swelling  Past Medical History:  Diagnosis Date  . Asthma   . Hypertension   . Migraine headache     Past Surgical History:  Procedure Laterality Date  . KNEE SURGERY     Age 42    Social History Jessica Eonatasha M Haman  reports that she has been smoking cigarettes. She has a 1.25 pack-year smoking history. She has never used smokeless tobacco. She reports that she drinks alcohol. She reports that she has current or past drug history. Drug: Marijuana.  family history includes Heart attack in her maternal grandmother; Hypertension in her maternal grandmother; Lung cancer in her maternal aunt.  Allergies  Allergen Reactions  . Bee Venom Anaphylaxis       PHYSICAL EXAMINATION: Vital signs: BP 124/84   Pulse 72   Ht 5\' 2"  (1.575 m)   Wt 169 lb (76.7 kg)   LMP 02/01/2018 (Exact Date)   BMI 30.91 kg/m   Constitutional: generally well-appearing, no acute distress Psychiatric: alert and oriented x3, cooperative Eyes: extraocular movements intact, anicteric, conjunctiva pink Mouth: oral pharynx moist, no lesions Neck: supple no lymphadenopathy Cardiovascular: heart regular rate and rhythm, no murmur Lungs: clear to auscultation bilaterally Abdomen: soft, nontender, nondistended, no obvious ascites, no peritoneal signs, normal bowel sounds, no organomegaly Rectal:deferred until colonoscopy Extremities: no  lower extremity edema bilaterally Skin: multiple tattoos.no clinically relevant lesions on visible extremities Neuro: No focal deficits. Cranial nerves intact  ASSESSMENT:  #1. Chronic GERD. For the most part managed well with omeprazole 40 mg daily. #2. Cervical  radiculopathy. Complaints of neck shoulder and arm pain with paresthesias most consistent with this diagnosis. Needs more formal evaluation, including possible MRI of the cervical spine. I will refer #3. Iron deficiency anemia. May be due to chronic blood loss from menstrual periods. However, given multiple GI complaints (including rectal bleeding) GI mucosal pathology must be excluded. #4. Intermittent rectal bleeding with blood in next was stool for 6 or 7 months. Iron deficiency anemia. Rule out colorectal neoplasia #5. Lower abdominal pain. #6. Constipation #7. Chronic alcohol and tobacco use #8. Obesity  PLAN:  #1. Reflux precautions, including weight loss #2. Continue omeprazole daily #3. Schedule upper endoscopy to evaluate chronic GERD and iron deficiency anemia.The nature of the procedure, as well as the risks, benefits, and alternatives were carefully and thoroughly reviewed with the patient. Ample time for discussion and questions allowed. The patient understood, was satisfied, and agreed to proceed. #4. Schedule colonoscopy to evaluate iron deficiency anemia, rectal bleeding, constipation, and lower abdominal pain. A significant concern would be colon cancer.The nature of the procedure, as well as the risks, benefits, and alternatives were carefully and thoroughly reviewed with the patient. Ample time for discussion and questions allowed. The patient understood, was satisfied, and agreed to proceed. #5. Referred to Dr. Ayesha MohairZack Smith regarding probable cervical radiculopathy. I will leave the workup to him  A copy of this consultation note has been sent to Dr. Marice PotterWallis and Dr. Katrinka BlazingSmith

## 2018-03-01 NOTE — Patient Instructions (Addendum)
You have been scheduled for an endoscopy and colonoscopy. Please follow the written instructions given to you at your visit today. Please pick up your prep supplies at the pharmacy within the next 1-3 days. If you use inhalers (even only as needed), please bring them with you on the day of your procedure. Your physician has requested that you go to www.startemmi.com and enter the access code given to you at your visit today. This web site gives a general overview about your procedure. However, you should still follow specific instructions given to you by our office regarding your preparation for the procedure.   I will send a referral to Terrilee FilesZach Smith in Primary Care about your neck problems

## 2018-03-04 ENCOUNTER — Other Ambulatory Visit: Payer: Self-pay | Admitting: Family Medicine

## 2018-03-04 DIAGNOSIS — G4700 Insomnia, unspecified: Secondary | ICD-10-CM

## 2018-03-05 NOTE — Telephone Encounter (Signed)
F/u 04/06/18 Last sript 11-30-17 #30 2 RF Last o/v 02/16/18

## 2018-03-12 ENCOUNTER — Encounter: Payer: Self-pay | Admitting: Family Medicine

## 2018-03-12 DIAGNOSIS — M25511 Pain in right shoulder: Principal | ICD-10-CM

## 2018-03-12 DIAGNOSIS — G8929 Other chronic pain: Secondary | ICD-10-CM

## 2018-03-16 ENCOUNTER — Other Ambulatory Visit: Payer: Self-pay

## 2018-03-20 ENCOUNTER — Telehealth: Payer: Self-pay | Admitting: Internal Medicine

## 2018-03-20 DIAGNOSIS — M5010 Cervical disc disorder with radiculopathy, unspecified cervical region: Secondary | ICD-10-CM

## 2018-03-21 NOTE — Telephone Encounter (Signed)
Referral to Ayesha Mohair (sports medicine) put in system

## 2018-03-27 ENCOUNTER — Telehealth: Payer: Self-pay | Admitting: Family Medicine

## 2018-03-27 ENCOUNTER — Telehealth: Payer: Self-pay | Admitting: Internal Medicine

## 2018-03-27 DIAGNOSIS — M5010 Cervical disc disorder with radiculopathy, unspecified cervical region: Secondary | ICD-10-CM

## 2018-03-27 NOTE — Telephone Encounter (Signed)
Copied from CRM [id number]. Topic: General - Other >> Mar 27, 2018 11:01 AM Jessica Hale wrote: Reason for CRM: Patient called to get the status of a referral for Ortho and also sports medicine for her shoulder.  She states that this was requested since 03/01/18 and she has not heard anything yet.  Please advise and call patient back to schedule appointment.  CB# [phone]

## 2018-03-27 NOTE — Telephone Encounter (Signed)
Patient stated that she would like to wait to schedule anything else. She is scheduled to see Dr. Alvester Morin on 09/27 and has a MRI scheduled this week. She will follow  Up with Dr. Earlene Plater on 10/04 to discuss any further needs for referrals.

## 2018-03-27 NOTE — Telephone Encounter (Signed)
Resubmitted referral for sports medicine but upon contacting patient discovered that patient's pcp wants her to move forward with ortho first before sports medicine.  If sports medicine calls her to schedule appointment she will update them with this information.

## 2018-03-29 ENCOUNTER — Encounter: Payer: Self-pay | Admitting: Internal Medicine

## 2018-03-31 ENCOUNTER — Ambulatory Visit
Admission: RE | Admit: 2018-03-31 | Discharge: 2018-03-31 | Disposition: A | Discharge status: Home / Self Care | Payer: 59 | Source: Ambulatory Visit | Attending: Family Medicine | Admitting: Family Medicine

## 2018-03-31 DIAGNOSIS — M4802 Spinal stenosis, cervical region: Secondary | ICD-10-CM | POA: Diagnosis not present

## 2018-04-02 ENCOUNTER — Encounter: Payer: Self-pay | Admitting: Surgical

## 2018-04-02 ENCOUNTER — Encounter: Payer: Self-pay | Admitting: Family Medicine

## 2018-04-02 ENCOUNTER — Telehealth: Payer: Self-pay | Admitting: Family Medicine

## 2018-04-02 NOTE — Telephone Encounter (Signed)
Copied from CRM [id number]. Topic: Quick Communication - See Telephone Encounter >> Apr 02, 2018  8:10 AM Jay SchlichterWeikart, Melissa J wrote: CRM for notification. See Telephone encounter for: 04/02/18. Pt would like call back regarding what she can do at work, since she has had her mri  Cb is [phone]

## 2018-04-02 NOTE — Telephone Encounter (Signed)
I have spoke with the patient and put letter up front for her to pick up.

## 2018-04-02 NOTE — Telephone Encounter (Signed)
Please advise 

## 2018-04-02 NOTE — Telephone Encounter (Signed)
Call and get more information. MRI with disc disease. I am okay with adding restrictions - need expectations.

## 2018-04-05 ENCOUNTER — Other Ambulatory Visit: Payer: Self-pay | Admitting: Family Medicine

## 2018-04-05 DIAGNOSIS — G4700 Insomnia, unspecified: Secondary | ICD-10-CM

## 2018-04-06 ENCOUNTER — Ambulatory Visit: Payer: 59 | Admitting: Family Medicine

## 2018-04-12 ENCOUNTER — Ambulatory Visit (AMBULATORY_SURGERY_CENTER): Payer: 59 | Admitting: Internal Medicine

## 2018-04-12 ENCOUNTER — Encounter: Payer: Self-pay | Admitting: Internal Medicine

## 2018-04-12 VITALS — BP 127/86 | HR 86 | Temp 99.3°F | Resp 16 | Ht 62.0 in | Wt 169.0 lb

## 2018-04-12 DIAGNOSIS — K625 Hemorrhage of anus and rectum: Secondary | ICD-10-CM | POA: Diagnosis not present

## 2018-04-12 DIAGNOSIS — D508 Other iron deficiency anemias: Secondary | ICD-10-CM

## 2018-04-12 DIAGNOSIS — K219 Gastro-esophageal reflux disease without esophagitis: Secondary | ICD-10-CM

## 2018-04-12 DIAGNOSIS — R1030 Lower abdominal pain, unspecified: Secondary | ICD-10-CM

## 2018-04-12 MED ORDER — SODIUM CHLORIDE 0.9 % IV SOLN: 500.0000 mL | Freq: Once | INTRAVENOUS | Status: DC

## 2018-04-12 NOTE — Op Note (Signed)
[facility] Patient Name: Jessica Hale Procedure Date: 04/12/2018 2:29 PM MRN: [id number] Endoscopist: Wilhemina Bonito. Marina Goodell , MD Age: 42 Referring MD:  Date of Birth: January 15, 1976 Gender: Female Account #: [id number] Procedure:                Upper GI endoscopy Indications:              Iron deficiency anemia, Esophageal reflux Medicines:                Monitored Anesthesia Care Procedure:                Pre-Anesthesia Assessment:                           - Prior to the procedure, a History and Physical                            was performed, and patient medications and                            allergies were reviewed. The patient's tolerance of                            previous anesthesia was also reviewed. The risks                            and benefits of the procedure and the sedation                            options and risks were discussed with the patient.                            All questions were answered, and informed consent                            was obtained. Prior Anticoagulants: The patient has                            taken no previous anticoagulant or antiplatelet                            agents. ASA Grade Assessment: II - A patient with                            mild systemic disease. After reviewing the risks                            and benefits, the patient was deemed in                            satisfactory condition to undergo the procedure.                           After obtaining informed consent, the endoscope was  passed under direct vision. Throughout the                            procedure, the patient's blood pressure, pulse, and                            oxygen saturations were monitored continuously. The                            Model GIF-HQ190 [id number]) scope was introduced                            through the mouth, and advanced to the third part                            of duodenum.  The upper GI endoscopy was                            accomplished without difficulty. The patient                            tolerated the procedure well. Scope In: Scope Out: Findings:                 The esophagus was normal.                           The stomach was normal.                           The examined duodenum was normal.                           The cardia and gastric fundus were normal on                            retroflexion. Complications:            No immediate complications. Estimated Blood Loss:     Estimated blood loss: none. Impression:               1. Normal EGD                           2. Iron deficiency anemia felt to be secondary to                            chronic blood loss from menstrual periods. Recommendation:           - Patient has a contact number available for                            emergencies. The signs and symptoms of potential                            delayed complications were discussed with the  patient. Return to normal activities tomorrow.                            Written discharge instructions were provided to the                            patient.                           - Resume previous diet.                           - Continue present medications.                           - Recommend daily iron supplement as long as the                            patient is having menstrual periods                           -Return to the care of your primary provider to                            monitor your blood counts. GI follow-up as needed Wilhemina Bonito. Marina Goodell, MD 04/12/2018 2:56:37 PM This report has been signed electronically.

## 2018-04-12 NOTE — Op Note (Signed)
[facility] Patient Name: Jessica Hale Procedure Date: 04/12/2018 2:29 PM MRN: [id number] Endoscopist: Wilhemina Bonito. Marina Goodell , MD Age: 42 Referring MD:  Date of Birth: 1975/07/20 Gender: Female Account #: [id number] Procedure:                Colonoscopy Indications:              Lower abdominal pain, Rectal bleeding, Iron                            deficiency anemia Medicines:                Monitored Anesthesia Care Procedure:                Pre-Anesthesia Assessment:                           - Prior to the procedure, a History and Physical                            was performed, and patient medications and                            allergies were reviewed. The patient's tolerance of                            previous anesthesia was also reviewed. The risks                            and benefits of the procedure and the sedation                            options and risks were discussed with the patient.                            All questions were answered, and informed consent                            was obtained. Prior Anticoagulants: The patient has                            taken no previous anticoagulant or antiplatelet                            agents. ASA Grade Assessment: II - A patient with                            mild systemic disease. After reviewing the risks                            and benefits, the patient was deemed in                            satisfactory condition to undergo the procedure.  After obtaining informed consent, the colonoscope                            was passed under direct vision. Throughout the                            procedure, the patient's blood pressure, pulse, and                            oxygen saturations were monitored continuously. The                            Colonoscope was introduced through the anus and                            advanced to the the cecum, identified by                    appendiceal orifice and ileocecal valve. The                            terminal ileum, ileocecal valve, appendiceal                            orifice, and rectum were photographed. The quality                            of the bowel preparation was excellent. The                            colonoscopy was performed without difficulty. The                            patient tolerated the procedure well. The bowel                            preparation used was SUPREP. Scope In: 2:35:38 PM Scope Out: 2:45:12 PM Scope Withdrawal Time: 0 hours 7 minutes 54 seconds  Total Procedure Duration: 0 hours 9 minutes 34 seconds  Findings:                 The terminal ileum appeared normal.                           Scattered small-mouthed diverticula were found in                            the left colon.                           Internal hemorrhoids were found during retroflexion.                           The exam was otherwise without abnormality on  direct and retroflexion views. Complications:            No immediate complications. Estimated blood loss:                            None. Estimated Blood Loss:     Estimated blood loss: none. Impression:               - The examined portion of the ileum was normal.                           - Diverticulosis in the left colon.                           - Internal hemorrhoids.                           - The examination was otherwise normal on direct                            and retroflexion views.                           - No specimens collected. Recommendation:           - Repeat colonoscopy in 10 years for screening                            purposes.                           - Patient has a contact number available for                            emergencies. The signs and symptoms of potential                            delayed complications were discussed with the                             patient. Return to normal activities tomorrow.                            Written discharge instructions were provided to the                            patient.                           - Resume previous diet.                           - Continue present medications. Wilhemina Bonito. Marina Goodell, MD 04/12/2018 2:48:35 PM This report has been signed electronically.

## 2018-04-12 NOTE — Patient Instructions (Signed)
Continue present medications. Recommend daily Iron supplements. Please read handouts on Diverticulosis and Hemorrhoids.      YOU HAD AN ENDOSCOPIC PROCEDURE TODAY AT THE [facility]:   Refer to the procedure report that was given to you for any specific questions about what was found during the examination.  If the procedure report does not answer your questions, please call your gastroenterologist to clarify.  If you requested that your care partner not be given the details of your procedure findings, then the procedure report has been included in a sealed envelope for you to review at your convenience later.  YOU SHOULD EXPECT: Some feelings of bloating in the abdomen. Passage of more gas than usual.  Walking can help get rid of the air that was put into your GI tract during the procedure and reduce the bloating. If you had a lower endoscopy (such as a colonoscopy or flexible sigmoidoscopy) you may notice spotting of blood in your stool or on the toilet paper. If you underwent a bowel prep for your procedure, you may not have a normal bowel movement for a few days.  Please Note:  You might notice some irritation and congestion in your nose or some drainage.  This is from the oxygen used during your procedure.  There is no need for concern and it should clear up in a day or so.  SYMPTOMS TO REPORT IMMEDIATELY:   Following lower endoscopy (colonoscopy or flexible sigmoidoscopy):  Excessive amounts of blood in the stool  Significant tenderness or worsening of abdominal pains  Swelling of the abdomen that is new, acute  Fever of 100F or higher   Following upper endoscopy (EGD)  Vomiting of blood or coffee ground material  New chest pain or pain under the shoulder blades  Painful or persistently difficult swallowing  New shortness of breath  Fever of 100F or higher  Black, tarry-looking stools  For urgent or emergent issues, a gastroenterologist can be reached at any hour  by calling [phone].   DIET:  We do recommend a small meal at first, but then you may proceed to your regular diet.  Drink plenty of fluids but you should avoid alcoholic beverages for 24 hours.  ACTIVITY:  You should plan to take it easy for the rest of today and you should NOT DRIVE or use heavy machinery until tomorrow (because of the sedation medicines used during the test).    FOLLOW UP: Our staff will call the number listed on your records the next business day following your procedure to check on you and address any questions or concerns that you may have regarding the information given to you following your procedure. If we do not reach you, we will leave a message.  However, if you are feeling well and you are not experiencing any problems, there is no need to return our call.  We will assume that you have returned to your regular daily activities without incident.  If any biopsies were taken you will be contacted by phone or by letter within the next 1-3 weeks.  Please call us at [phone] if you have not heard about the biopsies in 3 weeks.    SIGNATURES/CONFIDENTIALITY: You and/or your care partner have signed paperwork which will be entered into your electronic medical record.  These signatures attest to the fact that that the information above on your After Visit Summary has been reviewed and is understood.  Full responsibility of the confidentiality of this discharge  information lies with you and/or your care-partner. 

## 2018-04-12 NOTE — Progress Notes (Signed)
To recovery, report given VSS

## 2018-04-13 ENCOUNTER — Other Ambulatory Visit: Payer: Self-pay | Admitting: *Deleted

## 2018-04-13 ENCOUNTER — Ambulatory Visit (INDEPENDENT_AMBULATORY_CARE_PROVIDER_SITE_OTHER): Payer: 59 | Admitting: Physical Medicine and Rehabilitation

## 2018-04-13 ENCOUNTER — Encounter (INDEPENDENT_AMBULATORY_CARE_PROVIDER_SITE_OTHER): Payer: Self-pay | Admitting: Physical Medicine and Rehabilitation

## 2018-04-13 ENCOUNTER — Encounter: Payer: Self-pay | Admitting: Neurology

## 2018-04-13 ENCOUNTER — Telehealth: Payer: Self-pay

## 2018-04-13 VITALS — BP 144/102 | HR 71 | Ht 62.0 in | Wt 163.0 lb

## 2018-04-13 DIAGNOSIS — M7918 Myalgia, other site: Secondary | ICD-10-CM

## 2018-04-13 DIAGNOSIS — M5412 Radiculopathy, cervical region: Secondary | ICD-10-CM

## 2018-04-13 DIAGNOSIS — M5010 Cervical disc disorder with radiculopathy, unspecified cervical region: Secondary | ICD-10-CM

## 2018-04-13 DIAGNOSIS — R202 Paresthesia of skin: Secondary | ICD-10-CM

## 2018-04-13 NOTE — Progress Notes (Signed)
Jessica Hale - 42 y.o. female MRN [id number]  Date of birth: 1975-11-12  Office Visit Note: Visit Date: 04/13/2018 PCP: Helane Rima, DO Referred by: Helane Rima, DO  Subjective: Chief Complaint  Patient presents with  . Neck - Pain  . Right Shoulder - Pain  . Middle Back - Pain  . Left Hand - Numbness, Tingling  . Right Hand - Numbness, Tingling   HPI: Jessica Hale is a 42 year old right-hand-dominant female comes in today at the request of Dr. Helane Rima for evaluation management of her neck pain and arm pain with some numbness and tingling in the hands.  Dr. Earlene Plater is been treating her with good conservative care including anti-inflammatories and gabapentin without much relief.  She has a history of chronic periscapular pain and comes in today with increasing and worsening neck pain with upper back pain radiating to the right shoulder blade.  She denies any referral of the left shoulder or shoulder blade.  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  Dr. Earlene Plater has her on light duty at this point she feels like that helps to a degree.  She reports they are able to accommodate her with duty restrictions.  She reports really this is been ongoing pain for 2 years and is just gotten worse.  Has had some physical therapy.  She has had massages and ice packs and that seems to help temporarily.  She also reports numbness and tingling into both hands.  Right more than left.  It is in the radial digits.  Some of this is worse nocturnally and with activity.  She also reports some pain and tingling into the right leg.  She has had MRI of the cervical spine and this is reviewed today with the patient using spine models and the images themselves.  She has 2 very small disc protrusion centrally and somewhat a little bit right paracentral.  The C5-6 does touch the ventral part of the cord but there is no deformation or cord signal changes.  The patient has not  had focal weakness.  She has not had balance difficulties although she has a history of Mnire's disease.  She also has a history of chronic migraine.   Review of Systems  Constitutional: Negative for chills, fever, malaise/fatigue and weight loss.  HENT: Negative for hearing loss and sinus pain.   Eyes: Negative for blurred vision, double vision and photophobia.  Respiratory: Negative for cough and shortness of breath.   Cardiovascular: Negative for chest pain, palpitations and leg swelling.  Gastrointestinal: Negative for abdominal pain, nausea and vomiting.  Genitourinary: Negative for flank pain.  Musculoskeletal: Positive for back pain and neck pain. Negative for myalgias.  Skin: Negative for itching and rash.  Neurological: Positive for tingling. Negative for tremors, focal weakness and weakness.  Endo/Heme/Allergies: Negative.   Psychiatric/Behavioral: Negative for depression.  All other systems reviewed and are negative.  Otherwise per HPI.  Assessment & Plan: Visit Diagnoses:  1. Cervical radiculopathy   2. Cervical disc disorder with radiculopathy   3. Myofascial pain syndrome   4. Paresthesia of skin     Plan: Findings:  1.  Chronic 2-year history of neck and parascapular pain on the right and this would fit with radiculitis radiculopathy of a C5 or even C6 distribution.  She has disc protrusion with annular tearing which can cause painful neck pain and parascapular pain at C5-6 and less so at C6-7.  There is no focal nerve compression or focal stenosis of the cervical spine.  Really nothing that I would consider giving the patient hand numbness and tingling which she also has.  On exam today she does have a positive Phalen's test on the right think she may be having some carpal tunnel type syndrome.  She has a lot of physical work with her hands.  She is really failed conservative care at this point.  As for right now I think the best approach is C7-T1 interlaminar injection  diagnostically hopefully therapeutically.  This would be done with fluoroscopic guidance.  I also want her to see Dr. Nita Sickle for electrodiagnostic study of both upper limbs to look at carpal tunnel entrapment versus radiculopathy.  She will continue to stay on the gabapentin in the ketorolac.  The gabapentin has room to increase but it does make her sleepy.  We talked about using the sadness at night only.  She does have a history of chronic insomnia and it may help her sleep at least at first and eventually this may be something we can increase over time slowly weathers myself or Dr. Earlene Plater.  Dr. Earlene Plater is maintaining her on light duty restrictions I think that is fair at this point.  I do not think the cervical spine lesion is a surgical lesion at this point.  Her leg pain really was not evaluated today as it is a minor issue but still a problem.  She was relating the 2 together and I did explain to her that that something that would not be related to the cervical spine unless she had probably a worse imaging finding that we saw today although I cannot rule it totally out we could look at her leg at some point if it became more of an issue.    Meds & Orders: No orders of the defined types were placed in this encounter.   Orders Placed This Encounter  Procedures  . Ambulatory referral to Neurology    Follow-up: Return for Right C7-T1 interlaminar epidural steroid injection.   Procedures: No procedures performed  No notes on file   Clinical History: MRI CERVICAL SPINE WITHOUT CONTRAST  TECHNIQUE: Multiplanar, multisequence MR imaging of the cervical spine was performed. No intravenous contrast was administered.  COMPARISON:  None.  FINDINGS: ALIGNMENT: Straightened cervical lordosis.  No malalignment.  VERTEBRAE/DISCS: Vertebral bodies are intact. Intervertebral disc morphology's and signal are normal. Mild chronic discogenic endplate changes C5-6. No abnormal or acute bone  marrow signal.  CORD:Cervical spinal cord is normal morphology and signal characteristics from the cervicomedullary junction to level of T2-3, the most caudal well visualized level.  POSTERIOR FOSSA, VERTEBRAL ARTERIES, PARASPINAL TISSUES: No MR findings of ligamentous injury. Vertebral artery flow voids present. Included posterior fossa and paraspinal soft tissues are normal.  DISC LEVELS:  C2-3 through C4-5: No disc bulge, canal stenosis nor neural foraminal narrowing.  C5-6: Small central disc protrusion and annular fissure contacting the ventral spinal cord, mild canal stenosis. No neural foraminal narrowing.  C6-7: 1-2 mm central disc protrusion with annular fissure. No canal stenosis or neural foraminal narrowing.  C7-T1: No disc bulge, canal stenosis nor neural foraminal narrowing.  IMPRESSION: 1. Small C5-6 and C6-7 disc protrusions with annular fissures. No fracture or malalignment. 2. Mild canal stenosis C5-6.  No neural foraminal narrowing.   Electronically Signed   By: Awilda Metro M.D.   On: 03/31/2018 23:54   She reports that she has been smoking cigarettes. She  has a 1.25 pack-year smoking history. She has never used smokeless tobacco. No results for input(s): HGBA1C, LABURIC in the last 8760 hours.  Objective:  VS:  HT:5\' 2"  (157.5 cm)   WT:163 lb (73.9 kg)  BMI:29.81    BP:(!) 144/102  HR:71bpm  TEMP: ( )  RESP:100 % Physical Exam  Constitutional: She is oriented to person, place, and time. She appears well-developed and well-nourished. No distress.  HENT:  Head: Normocephalic and atraumatic.  Nose: Nose normal.  Mouth/Throat: Oropharynx is clear and moist.  Eyes: Pupils are equal, round, and reactive to light. Conjunctivae are normal.  Neck: Normal range of motion. Neck supple.  Cardiovascular: Regular rhythm and intact distal pulses.  Pulmonary/Chest: Effort normal. No respiratory distress.  Abdominal: She exhibits no distension.  There is no guarding.  Musculoskeletal:  Examination of the cervical spine shows very tight musculature of the trapezius and levator scapula and rhomboid right more than left.  She clearly has positive trigger points that are at least concordant to some degree with her pain.  She has some mild shoulder impingement on the right compared to left.  Negative drop arm test.  She has good strength in the upper extremities bilaterally without any deficit side to side.  She has intact sensation in all dermatomal patterns.  She does have a positive Phalen's test on the right wrist.  She has a negative Hoffman's test.  She has equivocally positive Spurling's and it just mainly causes pain.  Neurological: She is alert and oriented to person, place, and time. She exhibits normal muscle tone. Coordination normal.  Skin: Skin is warm. No rash noted. No erythema.  Psychiatric: She has a normal mood and affect. Her behavior is normal.  Nursing note and vitals reviewed.   Ortho Exam Imaging: No results found.  Past Medical/Family/Surgical/Social History: Medications & Allergies reviewed per EMR, new medications updated. Patient Active Problem List   Diagnosis Date Noted  . Chronic periscapular pain on right side 01/01/2018  . Hematochezia 09/06/2017  . Obesity (BMI 30.0-34.9) 09/17/2015  . Breast mass in female 03/20/2015  . Chronic right-sided low back pain without sciatica 08/01/2014  . Palpitations 03/06/2014  . Meniere's disease (cochlear hydrops) 01/25/2013  . Allergic rhinitis 04/10/2012  . Atypical migraine 05/02/2011  . Tobacco use disorder 03/09/2010  . Papanicolaou smear of cervix with positive high risk human papilloma virus (HPV) test 12/06/2007  . GERD 10/16/2007  . Benign essential HTN 02/07/2007  . Mild intermittent asthma 02/07/2007   Past Medical History:  Diagnosis Date  . Anemia   . Asthma   . GERD (gastroesophageal reflux disease)   . Hypertension   . Migraine headache     Family History  Problem Relation Age of Onset  . Heart attack Maternal Grandmother   . Hypertension Maternal Grandmother   . Lung cancer Maternal Aunt   . Colon cancer Neg Hx    Past Surgical History:  Procedure Laterality Date  . KNEE SURGERY     Age 23   Social History   Occupational History  . Not on file  Tobacco Use  . Smoking status: Current Every Day Smoker    Packs/day: 0.25    Years: 5.00    Pack years: 1.25    Types: Cigarettes  . Smokeless tobacco: Never Used  Substance and Sexual Activity  . Alcohol use: Yes    Alcohol/week: 0.0 standard drinks    Comment: occasional  . Drug use: Not Currently    Types: Marijuana  .  Sexual activity: Yes    Partners: Female    Comment: Lesbian

## 2018-04-13 NOTE — Telephone Encounter (Signed)
  Follow up Call-  Call Nelline Lio number 04/12/2018  Post procedure Call Myndi Wamble phone  # [phone]  Permission to leave phone message Yes  Some recent data might be hidden     Patient questions:  Do you have a fever, pain , or abdominal swelling? No. Pain Score  0 *  Have you tolerated food without any problems? Yes.    Have you been able to return to your normal activities? Yes.    Do you have any questions about your discharge instructions: Diet   No. Medications  No. Follow up visit  No.  Do you have questions or concerns about your Care? No.  Actions: * If pain score is 4 or above: No action needed, pain <4.

## 2018-04-13 NOTE — Progress Notes (Signed)
 .  Numeric Pain Rating Scale and Functional Assessment Average Pain 8 Pain Right Now 6 My pain is constant, sharp and tingling Pain is worse with: bending and some activites Pain improves with: heat/ice and medication   In the last MONTH (on 0-10 scale) has pain interfered with the following?  1. General activity like being  able to carry out your everyday physical activities such as walking, climbing stairs, carrying groceries, or moving a chair?  Rating(5)  2. Relation with others like being able to carry out your usual social activities and roles such as  activities at home, at work and in your community. Rating(4)  3. Enjoyment of life such that you have  been bothered by emotional problems such as feeling anxious, depressed or irritable?  Rating(2)

## 2018-04-19 ENCOUNTER — Encounter (INDEPENDENT_AMBULATORY_CARE_PROVIDER_SITE_OTHER): Payer: Self-pay | Admitting: Physical Medicine and Rehabilitation

## 2018-04-19 ENCOUNTER — Other Ambulatory Visit (INDEPENDENT_AMBULATORY_CARE_PROVIDER_SITE_OTHER): Payer: Self-pay | Admitting: Physical Medicine and Rehabilitation

## 2018-04-19 DIAGNOSIS — F411 Generalized anxiety disorder: Secondary | ICD-10-CM

## 2018-04-19 MED ORDER — TRIAZOLAM 0.25 MG PO TABS: ORAL_TABLET | ORAL | 0 refills | Status: DC

## 2018-04-19 NOTE — Progress Notes (Signed)
Pre-procedure triazolam ordered for pre-operative anxiety.  

## 2018-04-19 NOTE — Progress Notes (Signed)
Jessica Hale is a 42 y.o. female is here for follow up.  History of Present Illness:   Barnie Mort, CMA acting as scribe for Dr. Helane Rima.   HPI: follow up on back and neck pain. She had an appointment with [facility]. She has an EMG scheduled for this month as well as C7-T1 ESI. She has not noticed any improvement or change in pain. Working light duty helps somewhat. Thinks that she may need to transition to a new department in the future, work supportive though.  Health Maintenance Due  Topic Date Due  . INFLUENZA VACCINE  02/15/2018   Depression screen [facility] 2/9 12/29/2017 06/22/2017 06/02/2017  Decreased Interest 0 0 0  Down, Depressed, Hopeless 0 0 0  PHQ - 2 Score 0 0 0  Altered sleeping - - -  Tired, decreased energy - - -  Change in appetite - - -  Feeling bad or failure about yourself  - - -  Trouble concentrating - - -  Moving slowly or fidgety/restless - - -  Suicidal thoughts - - -  PHQ-9 Score - - -   PMHx, SurgHx, SocialHx, FamHx, Medications, and Allergies were reviewed in the Visit Navigator and updated as appropriate.   Patient Active Problem List   Diagnosis Date Noted  . Cervical radiculopathy 04/20/2018  . Chronic periscapular pain on right side 01/01/2018  . Hematochezia 09/06/2017  . Obesity (BMI 30.0-34.9) 09/17/2015  . Breast mass in female 03/20/2015  . Chronic right-sided low back pain without sciatica 08/01/2014  . Palpitations 03/06/2014  . Meniere's disease (cochlear hydrops) 01/25/2013  . Allergic rhinitis 04/10/2012  . Atypical migraine 05/02/2011  . Tobacco use disorder 03/09/2010  . Papanicolaou smear of cervix with positive high risk human papilloma virus (HPV) test 12/06/2007  . GERD 10/16/2007  . Benign essential HTN 02/07/2007  . Mild intermittent asthma 02/07/2007   Social History   Tobacco Use  . Smoking status: Current Every Day Smoker    Packs/day: 0.25    Years: 5.00    Pack years: 1.25    Types: Cigarettes  .  Smokeless tobacco: Never Used  Substance Use Topics  . Alcohol use: Yes    Alcohol/week: 0.0 standard drinks    Comment: occasional  . Drug use: Not Currently    Types: Marijuana   Current Medications and Allergies:   .  albuterol (VENTOLIN HFA) 108 (90 Base) MCG/ACT inhaler, Inhale 2 puffs into the lungs every 6 (six) hours as needed for wheezing or shortness of breath., Disp: 1 Inhaler, Rfl: 1 .  beclomethasone (QVAR REDIHALER) 40 MCG/ACT inhaler, Inhale 2 puffs into the lungs 2 (two) times daily., Disp: 2 Inhaler, Rfl: 6 .  gabapentin (NEURONTIN) 100 MG capsule, Take 1 capsule (100 mg total) by mouth 3 (three) times daily., Disp: 30 capsule, Rfl: 3 .  hydrochlorothiazide (HYDRODIURIL) 25 MG tablet, Take 0.5 tablets (12.5 mg total) by mouth daily., Disp: 30 tablet, Rfl: 11 .  metoprolol tartrate (LOPRESSOR) 25 MG tablet, Take 0.5 tablets (12.5 mg total) by mouth 2 (two) times daily., Disp: 60 tablet, Rfl: 11 .  omeprazole (PRILOSEC) 40 MG capsule, Take 1 capsule (40 mg total) by mouth daily., Disp: 30 capsule, Rfl: 3 .  triazolam (HALCION) 0.25 MG tablet, Take 1 tab by mouth 1 hour prior to procedure with very light food, do not drive motor vehicle., Disp: 2 tablet, Rfl: 0 .  zolpidem (AMBIEN) 5 MG tablet, TAKE 1 TABLET BY MOUTH AT BEDTIME AS  NEEDED FOR SLEEP, Disp: 30 tablet, Rfl: 0   Allergies  Allergen Reactions  . Bee Venom Anaphylaxis   Review of Systems   Pertinent items are noted in the HPI. Otherwise, ROS is negative.  Vitals:   Vitals:   04/20/18 0937  BP: 140/88  Pulse: 89  Temp: 99.1 F (37.3 C)  TempSrc: Oral  SpO2: 99%  Weight: 169 lb 6.4 oz (76.8 kg)  Height: 5\' 2"  (1.575 m)     Body mass index is 30.98 kg/m.  Physical Exam:   Physical Exam  Constitutional: She appears well-nourished.  HENT:  Head: Normocephalic and atraumatic.  Eyes: Pupils are equal, round, and reactive to light. EOM are normal.  Neck: Normal range of motion. Neck supple.    Cardiovascular: Normal rate, regular rhythm, normal heart sounds and intact distal pulses.  Pulmonary/Chest: Effort normal.  Abdominal: Soft.  Musculoskeletal:       Cervical back: She exhibits decreased range of motion, bony tenderness and spasm.       Lumbar back: She exhibits decreased range of motion, bony tenderness and spasm.  Skin: Skin is warm.  Psychiatric: She has a normal mood and affect. Her behavior is normal.  Nursing note and vitals reviewed.  Assessment and Plan:   Teeghan was seen today for follow-up.  Diagnoses and all orders for this visit:  Hyperglycemia -     Hemoglobin A1c  Cervical radiculopathy -     naproxen (NAPROSYN) 500 MG tablet; Take 1 tablet (500 mg total) by mouth 2 (two) times daily with a meal. - Increase Gabapentin to 300 mg po qhs. If helps pain and sleep, patient will call for Rx renewal.  Mild intermittent asthma without complication  Gastroesophageal reflux disease  Need for immunization against influenza -     Flu Vaccine QUAD 36+ mos IM  Anemia, unspecified type -     CBC with Differential/Platelet -     Iron, TIBC and Ferritin Panel  . Reviewed expectations re: course of current medical issues. . Discussed self-management of symptoms. . Outlined signs and symptoms indicating need for more acute intervention. . Patient verbalized understanding and all questions were answered. Marland Kitchen Health Maintenance issues including appropriate healthy diet, exercise, and smoking avoidance were discussed with patient. . See orders for this visit as documented in the electronic medical record. . Patient received an After Visit Summary.  CMA served as Neurosurgeon during this visit. History, Physical, and Plan performed by medical provider. The above documentation has been reviewed and is accurate and complete. Helane Rima, D.O.  Helane Rima, DO [place], [place] 04/20/2018

## 2018-04-20 ENCOUNTER — Ambulatory Visit: Payer: 59 | Admitting: Family Medicine

## 2018-04-20 ENCOUNTER — Encounter: Payer: Self-pay | Admitting: Family Medicine

## 2018-04-20 VITALS — BP 140/88 | HR 89 | Temp 99.1°F | Ht 62.0 in | Wt 169.4 lb

## 2018-04-20 DIAGNOSIS — D649 Anemia, unspecified: Secondary | ICD-10-CM | POA: Diagnosis not present

## 2018-04-20 DIAGNOSIS — K219 Gastro-esophageal reflux disease without esophagitis: Secondary | ICD-10-CM

## 2018-04-20 DIAGNOSIS — J4520 Mild intermittent asthma, uncomplicated: Secondary | ICD-10-CM

## 2018-04-20 DIAGNOSIS — M5412 Radiculopathy, cervical region: Secondary | ICD-10-CM | POA: Diagnosis not present

## 2018-04-20 DIAGNOSIS — Z23 Encounter for immunization: Secondary | ICD-10-CM | POA: Diagnosis not present

## 2018-04-20 DIAGNOSIS — R739 Hyperglycemia, unspecified: Secondary | ICD-10-CM

## 2018-04-20 LAB — CBC WITH DIFFERENTIAL/PLATELET
Basophils Absolute: 0.1 10*3/uL (ref 0.0–0.1)
Basophils Relative: 1 % (ref 0.0–3.0)
Eosinophils Absolute: 0.5 10*3/uL (ref 0.0–0.7)
Eosinophils Relative: 6.4 % — ABNORMAL HIGH (ref 0.0–5.0)
HCT: 35.7 % — ABNORMAL LOW (ref 36.0–46.0)
Hemoglobin: 11.5 g/dL — ABNORMAL LOW (ref 12.0–15.0)
Lymphocytes Relative: 30.3 % (ref 12.0–46.0)
Lymphs Abs: 2.2 10*3/uL (ref 0.7–4.0)
MCHC: 32.2 g/dL (ref 30.0–36.0)
MCV: 79.2 fl (ref 78.0–100.0)
Monocytes Absolute: 0.6 10*3/uL (ref 0.1–1.0)
Monocytes Relative: 8.1 % (ref 3.0–12.0)
Neutro Abs: 4 10*3/uL (ref 1.4–7.7)
Neutrophils Relative %: 54.2 % (ref 43.0–77.0)
Platelets: 356 10*3/uL (ref 150.0–400.0)
RBC: 4.5 Mil/uL (ref 3.87–5.11)
RDW: 17.4 % — ABNORMAL HIGH (ref 11.5–15.5)
WBC: 7.4 10*3/uL (ref 4.0–10.5)

## 2018-04-20 LAB — HEMOGLOBIN A1C: Hgb A1c MFr Bld: 5.5 % (ref 4.6–6.5)

## 2018-04-20 MED ORDER — NAPROXEN 500 MG PO TABS: 500.0000 mg | ORAL_TABLET | Freq: Two times a day (BID) | ORAL | 3 refills | Status: DC

## 2018-04-20 NOTE — Patient Instructions (Signed)
..  It takes about 2 weeks for protection to develop after vaccination.  There are many flu viruses, and they are always changing. Each year a new flu vaccine is made to protect against three or four viruses that are likely to cause disease in the upcoming flu season. Even when the vaccine doesn't exactly match these viruses, it may still provide some protection.   Influenza vaccine does not cause flu.  Influenza vaccine may be given at the same time as other vaccines.  3. Talk with your health care provider  Tell your vaccine provider if the person getting the vaccine: ; Has had an allergic reaction after a previous dose of influenza vaccine, or has any severe, life-threatening allergies.  ; Has ever had Guillain-Barr Syndrome (also called GBS).  In some cases, your health care provider may decide to postpone influenza vaccination to a future visit.  People with minor illnesses, such as a cold, may be vaccinated. People who are moderately or severely ill should usually wait until they recover before getting influenza vaccine.  Your health care provider can give you more information.  4. Risks of a reaction  ; Soreness, redness, and swelling where shot is given, fever, muscle aches, and headache can happen after influenza vaccine. ; There may be a very small increased risk of Guillain-Barr Syndrome (GBS) after inactivated influenza vaccine (the flu shot).  Young children who get the flu shot along with pneumococcal vaccine (PCV13), and/or DTaP vaccine at the same time might be slightly more likely to have a seizure caused by fever. Tell your health care provider if a child who is getting flu vaccine has ever had a seizure.  People sometimes faint after medical procedures, including vaccination. Tell your provider if you feel dizzy or have vision changes or ringing in the ears.  As with any medicine, there is a very remote chance of a vaccine causing a severe allergic reaction, other  serious injury, or death.  5. What if there is a serious problem?  An allergic reaction could occur after the vaccinated person leaves the clinic. If you see signs of a severe allergic reaction (hives, swelling of the face and throat, difficulty breathing, a fast heartbeat, dizziness, or weakness), call 9-1-1 and get the person to the nearest hospital.  For other signs that concern you, call your health care provider.  Adverse reactions should be reported to the Vaccine Adverse Event Reporting System (VAERS). Your health care provider will usually file this report, or you can do it yourself. Visit the VAERS website at www.vaers.hhs.gov or call 1-800-822-7967.  VAERS is only for reporting reactions, and VAERS staff do not give medical advice.  6. The National Vaccine Injury Compensation Program  The National Vaccine Injury Compensation Program (VICP) is a federal program that was created to compensate people who may have been injured by certain vaccines. Visit the VICP website at www.hrsa.gov/vaccinecompensation or call 1-800-338-2382 to learn about the program and about filing a claim. There is a time limit to file a claim for compensation.  7. How can I learn more?  ; Ask your health care provider.  ; Call your local or state health department. ; Contact the Centers for Disease Control and Prevention (CDC): - Call 1-800-232-4636 (1-800-CDC-INFO) or - Visit CDC's influenza website at www.cdc.gov/flu  Vaccine Information Statement (Interim) Inactivated Influenza Vaccine  03/01/2018 42 U.S.C.  300aa-26   Department of Health and Human Services Centers for Disease Control and Prevention  Office Use Only  

## 2018-04-21 LAB — IRON,TIBC AND FERRITIN PANEL
%SAT: 98 % (calc) — ABNORMAL HIGH (ref 16–45)
Ferritin: 21 ng/mL (ref 16–154)
Iron: 424 ug/dL — ABNORMAL HIGH (ref 40–190)
TIBC: 434 mcg/dL (calc) (ref 250–450)

## 2018-04-24 ENCOUNTER — Encounter: Payer: Self-pay | Admitting: Family Medicine

## 2018-04-30 ENCOUNTER — Encounter: Payer: Self-pay | Admitting: Family Medicine

## 2018-05-01 ENCOUNTER — Telehealth: Payer: Self-pay

## 2018-05-01 NOTE — Telephone Encounter (Signed)
Copied from CRM [id number]. Topic: General - Inquiry >> May 01, 2018 10:05 AM Crist Infante wrote: Reason for CRM: Jiles Crocker city of [place] returning you call.  She said the [facility] paperwork not filled out completley, missing sec 3, is it intermittent and a few other things. [phone] direct line

## 2018-05-02 NOTE — Telephone Encounter (Signed)
Tried to call phone number back and it was busy. Forms have been re faxed.

## 2018-05-04 ENCOUNTER — Encounter (INDEPENDENT_AMBULATORY_CARE_PROVIDER_SITE_OTHER): Payer: Self-pay | Admitting: Physical Medicine and Rehabilitation

## 2018-05-04 ENCOUNTER — Ambulatory Visit (INDEPENDENT_AMBULATORY_CARE_PROVIDER_SITE_OTHER): Payer: Self-pay

## 2018-05-04 ENCOUNTER — Ambulatory Visit (INDEPENDENT_AMBULATORY_CARE_PROVIDER_SITE_OTHER): Payer: 59 | Admitting: Physical Medicine and Rehabilitation

## 2018-05-04 VITALS — BP 148/112 | HR 89 | Temp 98.2°F

## 2018-05-04 DIAGNOSIS — M5412 Radiculopathy, cervical region: Secondary | ICD-10-CM | POA: Diagnosis not present

## 2018-05-04 MED ORDER — METHYLPREDNISOLONE ACETATE 80 MG/ML IJ SUSP
80.0000 mg | Freq: Once | INTRAMUSCULAR | Status: AC
  Administered 2018-05-04: 80 mg

## 2018-05-04 NOTE — Patient Instructions (Signed)

## 2018-05-04 NOTE — Progress Notes (Signed)
 .  Numeric Pain Rating Scale and Functional Assessment Average Pain 7   In the last MONTH (on 0-10 scale) has pain interfered with the following?  1. General activity like being  able to carry out your everyday physical activities such as walking, climbing stairs, carrying groceries, or moving a chair?  Rating(4)   +Driver, -BT, -Dye Allergies.  

## 2018-05-04 NOTE — Procedures (Signed)
Cervical Epidural Steroid Injection - Interlaminar Approach with Fluoroscopic Guidance  [name]      Date of Birth: 03-Apr-1976 MRN: [id number] PCP: Helane Rima, DO      Visit Date: 05/04/2018   Universal Protocol:    Date/Time: 10/18/191:38 PM  Consent Given By: the patient  Position: PRONE  Additional Comments: Vital signs were monitored before and after the procedure. Patient was prepped and draped in the usual sterile fashion. The correct patient, procedure, and site was verified.   Injection Procedure Details:  Procedure Site One Meds Administered:  Meds ordered this encounter  Medications  . methylPREDNISolone acetate (DEPO-MEDROL) injection 80 mg     Laterality: Right  Location/Site: C7-T1  Needle size: 20 G  Needle type: Touhy  Needle Placement: Paramedian epidural space  Findings:  -Comments: Excellent flow of contrast into the epidural space.  Procedure Details: Using a paramedian approach from the side mentioned above, the region overlying the inferior lamina was localized under fluoroscopic visualization and the soft tissues overlying this structure were infiltrated with 4 ml. of 1% Lidocaine without Epinephrine. A # 20 gauge, Tuohy needle was inserted into the epidural space using a paramedian approach.  The epidural space was localized using loss of resistance along with lateral and contralateral oblique bi-planar fluoroscopic views.  After negative aspirate for air, blood, and CSF, a 2 ml. volume of Isovue-250 was injected into the epidural space and the flow of contrast was observed. Radiographs were obtained for documentation purposes.   The injectate was administered into the level noted above.  Additional Comments:  The patient tolerated the procedure well Dressing: Band-Aid    Post-procedure details: Patient was observed during the procedure. Post-procedure instructions were reviewed.  Patient left the clinic in stable  condition.

## 2018-05-06 ENCOUNTER — Other Ambulatory Visit: Payer: Self-pay | Admitting: Family Medicine

## 2018-05-06 DIAGNOSIS — G4700 Insomnia, unspecified: Secondary | ICD-10-CM

## 2018-05-07 NOTE — Telephone Encounter (Signed)
Please advise on refill.

## 2018-05-10 ENCOUNTER — Ambulatory Visit (INDEPENDENT_AMBULATORY_CARE_PROVIDER_SITE_OTHER): Payer: 59 | Admitting: Neurology

## 2018-05-10 DIAGNOSIS — G5601 Carpal tunnel syndrome, right upper limb: Secondary | ICD-10-CM

## 2018-05-10 DIAGNOSIS — R202 Paresthesia of skin: Secondary | ICD-10-CM | POA: Diagnosis not present

## 2018-05-10 DIAGNOSIS — G5622 Lesion of ulnar nerve, left upper limb: Secondary | ICD-10-CM

## 2018-05-10 NOTE — Procedures (Signed)
[facility]  [address] [place], [address]  [place], [place] [address] Tel: [phone] Fax:  [phone] Test Date:  05/10/2018  [name] DOB: 06-May-1976 Physician: Nita Sickle, DO  Sex: Female Height: 5\' 2"  Ref Phys: Tyrell Antonio, MD  ID#: [id number] Temp: 36.0C Technician:    Patient Complaints: This is a 42 year old female referred for evaluation of bilateral hand paresthesias, worse on the right.  NCV & EMG Findings: Extensive electrodiagnostic testing of the right upper extremity and additional studies of the left shows:  1. Right mixed palmar sensory responses show prolonged latency.  Bilateral median, bilateral ulnar, and left mixed palmar sensory responses are within normal limits. 2. Bilateral median and right ulnar motor responses are within normal limits.  Left ulnar motor response shows decreased conduction velocity across the elbow (A Elbow-B Elbow, 45 m/s).   3. There is no evidence of active or chronic motor axonal loss changes affecting any of the tested muscles.  Motor unit configuration and recruitment pattern is within normal limits.  Impression: 1. Right median neuropathy at or distal to the wrist, consistent with a clinical diagnosis of carpal tunnel syndrome.  Overall, these findings are very mild in degree electrically. 2. Left ulnar neuropathy with slowing across the elbow, purely demyelinating and type and mild in degree electrically.   ___________________________ Nita Sickle, DO    Nerve Conduction Studies Anti Sensory Summary Table   Site NR Peak (ms) Norm Peak (ms) P-T Amp (V) Norm P-T Amp  Left Median Anti Sensory (2nd Digit)  36C  Wrist    2.5 <3.4 51.2 >20  Right Median Anti Sensory (2nd Digit)  36C  Wrist    2.6 <3.4 55.4 >20  Left Ulnar Anti Sensory (5th Digit)  36C  Wrist    2.4 <3.1 36.4 >12  Right Ulnar Anti Sensory (5th Digit)  36C  Wrist    2.3 <3.1 28.2 >12   Motor Summary Table   Site NR Onset (ms)  Norm Onset (ms) O-P Amp (mV) Norm O-P Amp Site1 Site2 Delta-0 (ms) Dist (cm) Vel (m/s) Norm Vel (m/s)  Left Median Motor (Abd Poll Brev)  36C  Wrist    2.5 <3.9 12.5 >6 Elbow Wrist 4.5 27.0 60 >50  Elbow    7.0  11.4         Right Median Motor (Abd Poll Brev)  36C  Wrist    2.4 <3.9 9.9 >6 Elbow Wrist 4.2 26.0 62 >50  Elbow    6.6  8.9         Left Ulnar Motor (Abd Dig Minimi)  36C  Wrist    2.0 <3.1 14.9 >7 B Elbow Wrist 3.0 22.0 73 >50  B Elbow    5.0  14.0  A Elbow B Elbow 2.2 10.0 45 >50  A Elbow    7.2  13.4         Right Ulnar Motor (Abd Dig Minimi)  36C  Wrist    1.6 <3.1 16.8 >7 B Elbow Wrist 3.5 23.0 66 >50  B Elbow    5.1  16.8  A Elbow B Elbow 1.8 10.0 56 >50  A Elbow    6.9  16.4          Comparison Summary Table   Site NR Peak (ms) Norm Peak (ms) P-T Amp (V) Site1 Site2 Delta-P (ms) Norm Delta (ms)  Left Median/Ulnar Palm Comparison (Wrist - 8cm)  36C  Median Palm    1.5 <2.2  29.5 Median Palm Ulnar Palm 0.1   Ulnar Palm    1.4 <2.2 19.4      Right Median/Ulnar Palm Comparison (Wrist - 8cm)  36C  Median Palm    1.7 <2.2 54.8 Median Palm Ulnar Palm 0.4   Ulnar Palm    1.3 <2.2 18.1       EMG   Side Muscle Ins Act Fibs Psw Fasc Number Recrt Dur Dur. Amp Amp. Poly Poly. Comment  Right 1stDorInt Nml Nml Nml Nml Nml Nml Nml Nml Nml Nml Nml Nml N/A  Right Abd Poll Brev Nml Nml Nml Nml Nml Nml Nml Nml Nml Nml Nml Nml N/A  Right PronatorTeres Nml Nml Nml Nml Nml Nml Nml Nml Nml Nml Nml Nml N/A  Right Biceps Nml Nml Nml Nml Nml Nml Nml Nml Nml Nml Nml Nml N/A  Right Triceps Nml Nml Nml Nml Nml Nml Nml Nml Nml Nml Nml Nml N/A  Right Deltoid Nml Nml Nml Nml Nml Nml Nml Nml Nml Nml Nml Nml N/A  Left 1stDorInt Nml Nml Nml Nml Nml Nml Nml Nml Nml Nml Nml Nml N/A  Left Ext Indicis Nml Nml Nml Nml Nml Nml Nml Nml Nml Nml Nml Nml N/A  Left PronatorTeres Nml Nml Nml Nml Nml Nml Nml Nml Nml Nml Nml Nml N/A  Left Biceps Nml Nml Nml Nml Nml Nml Nml Nml Nml Nml Nml Nml N/A  Left  Triceps Nml Nml Nml Nml Nml Nml Nml Nml Nml Nml Nml Nml N/A  Left Deltoid Nml Nml Nml Nml Nml Nml Nml Nml Nml Nml Nml Nml N/A  Left FlexCarpiUln Nml Nml Nml Nml Nml Nml Nml Nml Nml Nml Nml Nml N/A      Waveforms:

## 2018-05-11 NOTE — Progress Notes (Signed)
Left message for patient to call back for results.  

## 2018-05-14 ENCOUNTER — Encounter: Payer: Self-pay | Admitting: Family Medicine

## 2018-05-15 ENCOUNTER — Encounter: Payer: Self-pay | Admitting: Family Medicine

## 2018-05-15 NOTE — Progress Notes (Signed)
Jessica Hale - 42 y.o. female MRN [id number]  Date of birth: 1975/09/24  Office Visit Note: Visit Date: 05/04/2018 PCP: Helane Rima, DO Referred by: Helane Rima, DO  Subjective: Chief Complaint  Patient presents with  . Neck - Pain  . Right Shoulder - Pain  . Right Hand - Numbness  . Left Hand - Numbness   HPI:  Jessica Hale is a 42 y.o. female who comes in today For planned right C7-T1 interlaminar epidural steroid injection.  Please see our prior evaluation and management note for further details and justification.  ROS Otherwise per HPI.  Assessment & Plan: Visit Diagnoses:  1. Cervical radiculopathy     Plan: No additional findings.   Meds & Orders:  Meds ordered this encounter  Medications  . methylPREDNISolone acetate (DEPO-MEDROL) injection 80 mg    Orders Placed This Encounter  Procedures  . XR C-ARM NO REPORT  . Epidural Steroid injection    Follow-up: Return in about 2 weeks (around 05/18/2018) for review injection and EM/NCS.   Procedures: No procedures performed  Cervical Epidural Steroid Injection - Interlaminar Approach with Fluoroscopic Guidance  [name]      Date of Birth: [birth year] MRN: [id number] PCP: Helane Rima, DO      Visit Date: 05/04/2018   Universal Protocol:    Date/Time: 10/18/191:38 PM  Consent Given By: the patient  Position: PRONE  Additional Comments: Vital signs were monitored before and after the procedure. Patient was prepped and draped in the usual sterile fashion. The correct patient, procedure, and site was verified.   Injection Procedure Details:  Procedure Site One Meds Administered:  Meds ordered this encounter  Medications  . methylPREDNISolone acetate (DEPO-MEDROL) injection 80 mg     Laterality: Right  Location/Site: C7-T1  Needle size: 20 G  Needle type: Touhy  Needle Placement: Paramedian epidural space  Findings:  -Comments: Excellent flow of contrast into the  epidural space.  Procedure Details: Using a paramedian approach from the side mentioned above, the region overlying the inferior lamina was localized under fluoroscopic visualization and the soft tissues overlying this structure were infiltrated with 4 ml. of 1% Lidocaine without Epinephrine. A # 20 gauge, Tuohy needle was inserted into the epidural space using a paramedian approach.  The epidural space was localized using loss of resistance along with lateral and contralateral oblique bi-planar fluoroscopic views.  After negative aspirate for air, blood, and CSF, a 2 ml. volume of Isovue-250 was injected into the epidural space and the flow of contrast was observed. Radiographs were obtained for documentation purposes.   The injectate was administered into the level noted above.  Additional Comments:  The patient tolerated the procedure well Dressing: Band-Aid    Post-procedure details: Patient was observed during the procedure. Post-procedure instructions were reviewed.  Patient left the clinic in stable condition.    Clinical History: MRI CERVICAL SPINE WITHOUT CONTRAST  TECHNIQUE: Multiplanar, multisequence MR imaging of the cervical spine was performed. No intravenous contrast was administered.  COMPARISON:  None.  FINDINGS: ALIGNMENT: Straightened cervical lordosis.  No malalignment.  VERTEBRAE/DISCS: Vertebral bodies are intact. Intervertebral disc morphology's and signal are normal. Mild chronic discogenic endplate changes C5-6. No abnormal or acute bone marrow signal.  CORD:Cervical spinal cord is normal morphology and signal characteristics from the cervicomedullary junction to level of T2-3, the most caudal well visualized level.  POSTERIOR FOSSA, VERTEBRAL ARTERIES, PARASPINAL TISSUES: No MR findings of ligamentous injury. Vertebral artery flow voids present. Included  posterior fossa and paraspinal soft tissues are normal.  DISC LEVELS:  C2-3 through  C4-5: No disc bulge, canal stenosis nor neural foraminal narrowing.  C5-6: Small central disc protrusion and annular fissure contacting the ventral spinal cord, mild canal stenosis. No neural foraminal narrowing.  C6-7: 1-2 mm central disc protrusion with annular fissure. No canal stenosis or neural foraminal narrowing.  C7-T1: No disc bulge, canal stenosis nor neural foraminal narrowing.  IMPRESSION: 1. Small C5-6 and C6-7 disc protrusions with annular fissures. No fracture or malalignment. 2. Mild canal stenosis C5-6.  No neural foraminal narrowing.   Electronically Signed   By: Awilda Metro M.D.   On: 03/31/2018 23:54     Objective:  VS:  HT:    WT:   BMI:     BP:(!) 148/112  HR:89bpm  TEMP:98.2 F (36.8 C)(Oral)  RESP:  Physical Exam  Ortho Exam Imaging: No results found.

## 2018-05-16 ENCOUNTER — Other Ambulatory Visit: Payer: Self-pay

## 2018-05-16 DIAGNOSIS — M5412 Radiculopathy, cervical region: Secondary | ICD-10-CM

## 2018-05-18 ENCOUNTER — Encounter (INDEPENDENT_AMBULATORY_CARE_PROVIDER_SITE_OTHER): Payer: Self-pay | Admitting: Physical Medicine and Rehabilitation

## 2018-05-18 ENCOUNTER — Ambulatory Visit (INDEPENDENT_AMBULATORY_CARE_PROVIDER_SITE_OTHER): Payer: 59 | Admitting: Physical Medicine and Rehabilitation

## 2018-05-18 VITALS — BP 145/100 | HR 69

## 2018-05-18 DIAGNOSIS — G5601 Carpal tunnel syndrome, right upper limb: Secondary | ICD-10-CM

## 2018-05-18 DIAGNOSIS — F411 Generalized anxiety disorder: Secondary | ICD-10-CM

## 2018-05-18 DIAGNOSIS — M25511 Pain in right shoulder: Secondary | ICD-10-CM

## 2018-05-18 DIAGNOSIS — M5010 Cervical disc disorder with radiculopathy, unspecified cervical region: Secondary | ICD-10-CM

## 2018-05-18 DIAGNOSIS — M5412 Radiculopathy, cervical region: Secondary | ICD-10-CM

## 2018-05-18 DIAGNOSIS — R202 Paresthesia of skin: Secondary | ICD-10-CM | POA: Diagnosis not present

## 2018-05-18 DIAGNOSIS — G5622 Lesion of ulnar nerve, left upper limb: Secondary | ICD-10-CM

## 2018-05-18 DIAGNOSIS — G8929 Other chronic pain: Secondary | ICD-10-CM

## 2018-05-18 MED ORDER — DIAZEPAM 5 MG PO TABS: ORAL_TABLET | ORAL | 0 refills | Status: DC

## 2018-05-18 NOTE — Progress Notes (Signed)
  Numeric Pain Rating Scale and Functional Assessment Average Pain 6 Pain Right Now 4 My pain is constant and aching Pain is worse with: some activites Pain improves with: heat/ice   In the last MONTH (on 0-10 scale) has pain interfered with the following?  1. General activity like being  able to carry out your everyday physical activities such as walking, climbing stairs, carrying groceries, or moving a chair?  Rating(4)  2. Relation with others like being able to carry out your usual social activities and roles such as  activities at home, at work and in your community. Rating(4)  3. Enjoyment of life such that you have  been bothered by emotional problems such as feeling anxious, depressed or irritable?  Rating(6)

## 2018-05-21 ENCOUNTER — Encounter (INDEPENDENT_AMBULATORY_CARE_PROVIDER_SITE_OTHER): Payer: Self-pay | Admitting: Physical Medicine and Rehabilitation

## 2018-05-21 ENCOUNTER — Ambulatory Visit (INDEPENDENT_AMBULATORY_CARE_PROVIDER_SITE_OTHER): Payer: Self-pay

## 2018-05-21 ENCOUNTER — Ambulatory Visit (INDEPENDENT_AMBULATORY_CARE_PROVIDER_SITE_OTHER): Payer: 59 | Admitting: Physical Medicine and Rehabilitation

## 2018-05-21 VITALS — BP 150/107 | HR 82 | Temp 98.1°F

## 2018-05-21 DIAGNOSIS — M5412 Radiculopathy, cervical region: Secondary | ICD-10-CM | POA: Diagnosis not present

## 2018-05-21 DIAGNOSIS — M5010 Cervical disc disorder with radiculopathy, unspecified cervical region: Secondary | ICD-10-CM

## 2018-05-21 MED ORDER — METHYLPREDNISOLONE ACETATE 80 MG/ML IJ SUSP
80.0000 mg | Freq: Once | INTRAMUSCULAR | Status: AC
  Administered 2018-05-21: 80 mg

## 2018-05-21 NOTE — Patient Instructions (Signed)

## 2018-05-21 NOTE — Progress Notes (Signed)
 .  Numeric Pain Rating Scale and Functional Assessment Average Pain 6   In the last MONTH (on 0-10 scale) has pain interfered with the following?  1. General activity like being  able to carry out your everyday physical activities such as walking, climbing stairs, carrying groceries, or moving a chair?  Rating(3)   +Driver, -BT, -Dye Allergies.  

## 2018-05-21 NOTE — Procedures (Signed)
Cervical Epidural Steroid Injection - Interlaminar Approach with Fluoroscopic Guidance  [name]      Date of Birth: 10-19-1975 MRN: [id number] PCP: Helane Rima, DO      Visit Date: 05/21/2018   Universal Protocol:    Date/Time: 11/04/194:45 PM  Consent Given By: the patient  Position: PRONE  Additional Comments: Vital signs were monitored before and after the procedure. Patient was prepped and draped in the usual sterile fashion. The correct patient, procedure, and site was verified.   Injection Procedure Details:  Procedure Site One Meds Administered:  Meds ordered this encounter  Medications  . methylPREDNISolone acetate (DEPO-MEDROL) injection 80 mg     Laterality: Right  Location/Site: C7-T1  Needle size: 20 G  Needle type: Touhy  Needle Placement: Paramedian epidural space  Findings:  -Comments: Excellent flow of contrast into the epidural space.  Procedure Details: Using a paramedian approach from the side mentioned above, the region overlying the inferior lamina was localized under fluoroscopic visualization and the soft tissues overlying this structure were infiltrated with 4 ml. of 1% Lidocaine without Epinephrine. A # 20 gauge, Tuohy needle was inserted into the epidural space using a paramedian approach.  The epidural space was localized using loss of resistance along with lateral and contralateral oblique bi-planar fluoroscopic views.  After negative aspirate for air, blood, and CSF, a 2 ml. volume of Isovue-250 was injected into the epidural space and the flow of contrast was observed. Radiographs were obtained for documentation purposes.   The injectate was administered into the level noted above.  Additional Comments:  The patient tolerated the procedure well Dressing: Band-Aid    Post-procedure details: Patient was observed during the procedure. Post-procedure instructions were reviewed.  Patient left the clinic in stable  condition.

## 2018-05-31 ENCOUNTER — Other Ambulatory Visit: Payer: Self-pay | Admitting: Family Medicine

## 2018-05-31 ENCOUNTER — Encounter (INDEPENDENT_AMBULATORY_CARE_PROVIDER_SITE_OTHER): Payer: Self-pay | Admitting: Physical Medicine and Rehabilitation

## 2018-05-31 NOTE — Progress Notes (Signed)
Jessica Hale - 42 y.o. female MRN [id number]  Date of birth: [birth year]  Office Visit Note: Visit Date: 05/21/2018 PCP: Helane Rima, DO Referred by: Helane Rima, DO  Subjective: Chief Complaint  Patient presents with  . Neck - Pain   HPI:  Jessica Hale is a 42 y.o. female who comes in today For planned repeat C7-T1 interlaminar epidural steroid injection.  Please see our prior evaluation and management note for further details and justification.  She has mild carpal tunnel median nerve compression by electrodiagnostic study on the right hand.  I have asked her to talk to Dr. Earlene Plater and possibly see Dr. Berline Chough in their office for injection of the carpal tunnel with [facility] dissection.  ROS Otherwise per HPI.  Assessment & Plan: Visit Diagnoses:  1. Cervical radiculopathy   2. Cervical disc disorder with radiculopathy     Plan: No additional findings.   Meds & Orders:  Meds ordered this encounter  Medications  . methylPREDNISolone acetate (DEPO-MEDROL) injection 80 mg    Orders Placed This Encounter  Procedures  . XR C-ARM NO REPORT  . Epidural Steroid injection    Follow-up: Return in about 3 weeks (around 06/11/2018) for Dr. Earlene Plater.   Procedures: No procedures performed  Cervical Epidural Steroid Injection - Interlaminar Approach with Fluoroscopic Guidance  [name]      Date of Birth: Nov 03, 1975 MRN: [id number] PCP: Helane Rima, DO      Visit Date: 05/21/2018   Universal Protocol:    Date/Time: 11/04/194:45 PM  Consent Given By: the patient  Position: PRONE  Additional Comments: Vital signs were monitored before and after the procedure. Patient was prepped and draped in the usual sterile fashion. The correct patient, procedure, and site was verified.   Injection Procedure Details:  Procedure Site One Meds Administered:  Meds ordered this encounter  Medications  . methylPREDNISolone acetate (DEPO-MEDROL) injection 80 mg      Laterality: Right  Location/Site: C7-T1  Needle size: 20 G  Needle type: Touhy  Needle Placement: Paramedian epidural space  Findings:  -Comments: Excellent flow of contrast into the epidural space.  Procedure Details: Using a paramedian approach from the side mentioned above, the region overlying the inferior lamina was localized under fluoroscopic visualization and the soft tissues overlying this structure were infiltrated with 4 ml. of 1% Lidocaine without Epinephrine. A # 20 gauge, Tuohy needle was inserted into the epidural space using a paramedian approach.  The epidural space was localized using loss of resistance along with lateral and contralateral oblique bi-planar fluoroscopic views.  After negative aspirate for air, blood, and CSF, a 2 ml. volume of Isovue-250 was injected into the epidural space and the flow of contrast was observed. Radiographs were obtained for documentation purposes.   The injectate was administered into the level noted above.  Additional Comments:  The patient tolerated the procedure well Dressing: Band-Aid    Post-procedure details: Patient was observed during the procedure. Post-procedure instructions were reviewed.  Patient left the clinic in stable condition.    Clinical History: MRI CERVICAL SPINE WITHOUT CONTRAST  TECHNIQUE: Multiplanar, multisequence MR imaging of the cervical spine was performed. No intravenous contrast was administered.  COMPARISON:  None.  FINDINGS: ALIGNMENT: Straightened cervical lordosis.  No malalignment.  VERTEBRAE/DISCS: Vertebral bodies are intact. Intervertebral disc morphology's and signal are normal. Mild chronic discogenic endplate changes C5-6. No abnormal or acute bone marrow signal.  CORD:Cervical spinal cord is normal morphology and signal characteristics from the  cervicomedullary junction to level of T2-3, the most caudal well visualized level.  POSTERIOR FOSSA, VERTEBRAL  ARTERIES, PARASPINAL TISSUES: No MR findings of ligamentous injury. Vertebral artery flow voids present. Included posterior fossa and paraspinal soft tissues are normal.  DISC LEVELS:  C2-3 through C4-5: No disc bulge, canal stenosis nor neural foraminal narrowing.  C5-6: Small central disc protrusion and annular fissure contacting the ventral spinal cord, mild canal stenosis. No neural foraminal narrowing.  C6-7: 1-2 mm central disc protrusion with annular fissure. No canal stenosis or neural foraminal narrowing.  C7-T1: No disc bulge, canal stenosis nor neural foraminal narrowing.  IMPRESSION: 1. Small C5-6 and C6-7 disc protrusions with annular fissures. No fracture or malalignment. 2. Mild canal stenosis C5-6.  No neural foraminal narrowing.   Electronically Signed   By: Awilda Metroourtnay  Bloomer M.D.   On: 03/31/2018 23:54     Objective:  VS:  HT:    WT:   BMI:     BP:(!) 150/107  HR:82bpm  TEMP:98.1 F (36.7 C)(Oral)  RESP:  Physical Exam  Ortho Exam Imaging: No results found.

## 2018-05-31 NOTE — Progress Notes (Signed)
Jessica Hale - 42 y.o. female MRN [id number]  Date of birth: Nov 19, 1975  Office Visit Note: Visit Date: 05/18/2018 PCP: Helane Rima, DO Referred by: Helane Rima, DO  Subjective: Chief Complaint  Patient presents with  . Neck - Pain  . Right Shoulder - Pain  . Right Hand - Pain, Numbness  . Left Hand - Pain, Numbness   HPI: Jessica Hale is a 42 y.o. female who comes in today For evaluation management of neck and shoulder pain predominantly right-sided with some numbness and tingling in the hands right more than left.  She is status post C7-T1 interlaminar epidural steroid injection quite happy with the results.  She reports average pain is a 6 out of 10 which is much improved.  She reports at least 50% relief.  She says she still has numbness and tingling in the hands however.  She reports this is worse at night in the morning.  Gets worse on the right than the left.  It is more in the radial digits.  It is somewhat nondermatomal.  She had no issues with the epidural injection as far as complications.  She is on lateral work duty through Dr. Earlene Plater.  She has no other red flag complaints of weakness or headache or other neurologic findings.  She is also status post electrodiagnostic study of the upper limbs.  Extensive study was performed on the right with some studies on the left.  Interestingly she has a left ulnar neuropathy at the elbow which may give her some tingling numbness in the left arm.  This was a demyelinating drop of conduction velocity across the elbow.  She also has a mild right median nerve compression.  I do feel like the median nerve compression is symptomatic even though it is mild.  These results are reviewed below.  Review of Systems  Constitutional: Negative for chills, fever, malaise/fatigue and weight loss.  HENT: Negative for hearing loss and sinus pain.   Eyes: Negative for blurred vision, double vision and photophobia.  Respiratory: Negative for cough and  shortness of breath.   Cardiovascular: Negative for chest pain, palpitations and leg swelling.  Gastrointestinal: Negative for abdominal pain, nausea and vomiting.  Genitourinary: Negative for flank pain.  Musculoskeletal: Positive for joint pain and neck pain. Negative for myalgias.  Skin: Negative for itching and rash.  Neurological: Positive for tingling. Negative for tremors, focal weakness and weakness.  Endo/Heme/Allergies: Negative.   Psychiatric/Behavioral: Negative for depression.  All other systems reviewed and are negative.  Otherwise per HPI.  Assessment & Plan: Visit Diagnoses:  1. Cervical radiculopathy   2. Cervical disc disorder with radiculopathy   3. Chronic right shoulder pain   4. Paresthesia of skin   5. Carpal tunnel syndrome, right upper limb   6. Ulnar nerve entrapment at elbow, left   7. Pre-operative anxiety     Plan: Findings:  Chronic neck and shoulder blade pain which is a combination of myofascial pain and small central disc protrusion in the cervical spine.  Epidural injections helped significantly more than 50%.  She tolerated that very well.  I do think because she is gotten such relief with that that we should repeat it one time and see if she gets more relief from that.  The working light duty is helped not exacerbate her symptoms and she will continue to follow with Dr. Earlene Plater on that.  In terms of her tingling numbness in the hands this is not cervical spine related.  Her cervical spine findings are very mild.  Electrodiagnostic study was performed by Dr. Nita Sickle and this showed mild median nerve neuropathy at the wrist on the right and interestingly the left ulnar neuropathy which is demyelinating only.  She does have some symptoms on the left but very mild and they are not necessarily ulnar nerve distribution.  I do think it would be good for her to see Dr. Berline Chough at Dr. Philis Pique office for possible injection and hydrodissection on the right median  neuropathy.  We talked about her wearing a wrist brace.  She will follow-up with Dr. Earlene Plater in this regard.  Neither lesions are surgical necessarily although the left ulnar neuropathy if it were highly symptomatic could be looked at from a nerve transposition standpoint by 1 of the surgeons here in our practice.  We will see her back as needed after second injection.    Meds & Orders:  Meds ordered this encounter  Medications  . diazepam (VALIUM) 5 MG tablet    Sig: Take 1 by mouth 1 hour  pre-procedure with very light food. May bring 2nd tablet to appointment.    Dispense:  2 tablet    Refill:  0   No orders of the defined types were placed in this encounter.   Follow-up: Return for Repeat C7-T1 interlaminar epidural steroid injection.   Procedures: No procedures performed  No notes on file   Clinical History: NCV & EMG Findings: 05/10/2018  Extensive electrodiagnostic testing of the right upper extremity and additional studies of the left shows:  1. Right mixed palmar sensory responses show prolonged latency.  Bilateral median, bilateral ulnar, and left mixed palmar sensory responses are within normal limits. 2. Bilateral median and right ulnar motor responses are within normal limits.  Left ulnar motor response shows decreased conduction velocity across the elbow (A Elbow-B Elbow, 45 m/s).   3. There is no evidence of active or chronic motor axonal loss changes affecting any of the tested muscles.  Motor unit configuration and recruitment pattern is within normal limits.  Impression: 1. Right median neuropathy at or distal to the wrist, consistent with a clinical diagnosis of carpal tunnel syndrome.  Overall, these findings are very mild in degree electrically. 2. Left ulnar neuropathy with slowing across the elbow, purely demyelinating and type and mild in degree electrically.   ___________________________ Nita Sickle, DO  MRI CERVICAL SPINE WITHOUT  CONTRAST  TECHNIQUE: Multiplanar, multisequence MR imaging of the cervical spine was performed. No intravenous contrast was administered.  COMPARISON: None.  FINDINGS: ALIGNMENT: Straightened cervical lordosis. No malalignment.  VERTEBRAE/DISCS: Vertebral bodies are intact. Intervertebral disc morphology's and signal are normal. Mild chronic discogenic endplate changes C5-6. No abnormal or acute bone marrow signal.  CORD:Cervical spinal cord is normal morphology and signal characteristics from the cervicomedullary junction to level of T2-3, the most caudal well visualized level.  POSTERIOR FOSSA, VERTEBRAL ARTERIES, PARASPINAL TISSUES: No MR findings of ligamentous injury. Vertebral artery flow voids present. Included posterior fossa and paraspinal soft tissues are normal.  DISC LEVELS:  C2-3 through C4-5: No disc bulge, canal stenosis nor neural foraminal narrowing.  C5-6: Small central disc protrusion and annular fissure contacting the ventral spinal cord, mild canal stenosis. No neural foraminal narrowing.  C6-7: 1-2 mm central disc protrusion with annular fissure. No canal stenosis or neural foraminal narrowing.  C7-T1: No disc bulge, canal stenosis nor neural foraminal narrowing.  IMPRESSION: 1. Small C5-6 and C6-7 disc protrusions with annular fissures. No fracture or  malalignment. 2. Mild canal stenosis C5-6. No neural foraminal narrowing.   Electronically Signed By: Awilda Metro M.D. On: 03/31/2018 23:54   She reports that she has been smoking cigarettes. She has a 1.25 pack-year smoking history. She has never used smokeless tobacco.  Recent Labs    04/20/18 1013  HGBA1C 5.5    Objective:  VS:  HT:    WT:   BMI:     BP:(!) 145/100  HR:69bpm  TEMP: ( )  RESP:  Physical Exam  Constitutional: She is oriented to person, place, and time. She appears well-developed and well-nourished. No distress.  HENT:  Head: Normocephalic and  atraumatic.  Nose: Nose normal.  Mouth/Throat: Oropharynx is clear and moist.  Eyes: Pupils are equal, round, and reactive to light. Conjunctivae are normal.  Neck: Normal range of motion. Neck supple.  Cardiovascular: Regular rhythm and intact distal pulses.  Pulmonary/Chest: Effort normal. No respiratory distress.  Abdominal: She exhibits no distension. There is no guarding.  Musculoskeletal:  Cervical spine range of motion is fairly full.  There is a negative Spurling's test bilaterally.  She does have focal trigger points that are somewhat painful.  She has no real impingement sign of the shoulder although she has pain in ranges of motion.  Inspection reveals no atrophy of the bilateral APB or FDI or hand intrinsics. There is no swelling, color changes, allodynia or dystrophic changes. There is 5 out of 5 strength in the bilateral wrist extension, finger abduction and long finger flexion. There is intact sensation to light touch in all dermatomal and peripheral nerve distributions. There is a negative Froment's test bilaterally.  There is a positive Phalen's test on the right.  There is equivocal Tinel's at the wrist and elbows bilaterally.  There is a negative Hoffmann's test bilaterally.  Neurological: She is alert and oriented to person, place, and time. She exhibits normal muscle tone. Coordination normal.  Skin: Skin is warm and dry. No rash noted. No erythema.  Psychiatric: She has a normal mood and affect. Her behavior is normal.  Nursing note and vitals reviewed.   Ortho Exam Imaging: No results found.  Past Medical/Family/Surgical/Social History: Medications & Allergies reviewed per EMR, new medications updated. Patient Active Problem List   Diagnosis Date Noted  . Cervical radiculopathy 04/20/2018  . Chronic periscapular pain on right side 01/01/2018  . Hematochezia 09/06/2017  . Obesity (BMI 30.0-34.9) 09/17/2015  . Breast mass in female 03/20/2015  . Chronic right-sided  low back pain without sciatica 08/01/2014  . Palpitations 03/06/2014  . Meniere's disease (cochlear hydrops) 01/25/2013  . Allergic rhinitis 04/10/2012  . Atypical migraine 05/02/2011  . Tobacco use disorder 03/09/2010  . Papanicolaou smear of cervix with positive high risk human papilloma virus (HPV) test 12/06/2007  . GERD 10/16/2007  . Benign essential HTN 02/07/2007  . Mild intermittent asthma 02/07/2007   Past Medical History:  Diagnosis Date  . Anemia   . Asthma   . GERD (gastroesophageal reflux disease)   . Hypertension   . Migraine headache    Family History  Problem Relation Age of Onset  . Heart attack Maternal Grandmother   . Hypertension Maternal Grandmother   . Lung cancer Maternal Aunt   . Colon cancer Neg Hx    Past Surgical History:  Procedure Laterality Date  . KNEE SURGERY     Age 89   Social History   Occupational History  . Not on file  Tobacco Use  . Smoking status: Current Every  Day Smoker    Packs/day: 0.25    Years: 5.00    Pack years: 1.25    Types: Cigarettes  . Smokeless tobacco: Never Used  Substance and Sexual Activity  . Alcohol use: Yes    Alcohol/week: 0.0 standard drinks    Comment: occasional  . Drug use: Not Currently    Types: Marijuana  . Sexual activity: Yes    Partners: Female    Comment: Lesbian

## 2018-06-18 ENCOUNTER — Other Ambulatory Visit: Payer: Self-pay | Admitting: Family Medicine

## 2018-06-18 DIAGNOSIS — G4700 Insomnia, unspecified: Secondary | ICD-10-CM

## 2018-06-18 NOTE — Telephone Encounter (Signed)
Ok to fill 

## 2018-06-21 ENCOUNTER — Ambulatory Visit: Payer: 59 | Admitting: Physical Therapy

## 2018-06-22 ENCOUNTER — Ambulatory Visit: Payer: 59 | Admitting: Physical Therapy

## 2018-06-28 ENCOUNTER — Ambulatory Visit: Payer: 59 | Admitting: Physical Therapy

## 2018-06-28 ENCOUNTER — Encounter: Payer: Self-pay | Admitting: Physical Therapy

## 2018-06-28 DIAGNOSIS — M542 Cervicalgia: Secondary | ICD-10-CM | POA: Diagnosis not present

## 2018-06-28 NOTE — Patient Instructions (Signed)
Access Code: [email or web address]  URL: [email or web address]  Date: 06/28/2018  Prepared by: Sedalia MutaLauren Millissa Deese   Exercises  Seated Cervical Retraction - 10 reps - 2 sets - 2x daily  Seated Shoulder Rolls - 10 reps - 2 sets - 2x daily  Seated Scapular Retraction - 10 reps - 2 sets - 2x daily  Seated Cervical Sidebending Stretch - 3 sets - 30 hold - 2x daily  Supine Chest Stretch on Foam Roll - 3 reps - 30 hold - 2x daily

## 2018-07-02 ENCOUNTER — Encounter: Payer: Self-pay | Admitting: Physical Therapy

## 2018-07-02 NOTE — Therapy (Signed)
[facility] [address] [place], [place], [address] Phone: [phone]   Fax:  [phone]  Physical Therapy Evaluation  Patient Details  Name: Jessica Hale MRN: [id number] Date of Birth: December 17, 1975 Referring Provider (PT): Helane Rima   Encounter Date: 06/28/2018  PT End of Session - 07/02/18 1229    Visit Number  1    Number of Visits  12    Date for PT Re-Evaluation  08/09/18    Authorization Type  UHC    PT Start Time  1355    PT Stop Time  1430    PT Time Calculation (min)  35 min    Activity Tolerance  Patient tolerated treatment well    Behavior During Therapy  [facility] for tasks assessed/performed       Past Medical History:  Diagnosis Date  . Anemia   . Asthma   . GERD (gastroesophageal reflux disease)   . Hypertension   . Migraine headache     Past Surgical History:  Procedure Laterality Date  . KNEE SURGERY     Age 42    There were no vitals filed for this visit.   Subjective Assessment - 07/02/18 1227    Subjective  Pt states ongoing pain in neck/upper back. She has had chiropractor treatments in past. She also had recent injection (x2), that has helped somewhat. She states numbness in bil hands/R>L, does have carpal tunnel braces to wear. She works full time at city, driving, machinery, shoveling, Catering manager.  States mild tingling in R UE, but mostly in hands.     Limitations  Lifting;Standing;House hold activities    Patient Stated Goals  decreased pain    Currently in Pain?  Yes    Pain Score  5     Pain Location  Neck    Pain Orientation  Right;Left    Pain Descriptors / Indicators  Aching    Pain Type  Chronic pain    Pain Onset  More than a month ago    Pain Frequency  Intermittent    Aggravating Factors   work, reaching, lifting    Pain Relieving Factors  injection, heat    Multiple Pain Sites  Yes    Pain Score  5    Pain Location  Hand    Pain Orientation  Right;Left    Pain Descriptors /  Indicators  Tingling    Pain Type  Chronic pain    Pain Onset  More than a month ago    Pain Frequency  Intermittent    Aggravating Factors   repetitive motions, night time         [facility] PT Assessment - 07/02/18 0001      Assessment   Medical Diagnosis  Cervical pain    Referring Provider (PT)  Helane Rima      Precautions   Precautions  None      Balance Screen   Has the patient fallen in the past 6 months  No      Prior Function   Level of Independence  Independent      Cognition   Overall Cognitive Status  Within Functional Limits for tasks assessed      AROM   Overall AROM Comments  Cervical: mild limitation for L/R rotation; Flex/Ext : [facility];       Strength   Overall Strength Comments  L: 40/ R: 45 lb Grip;   Shoulders: 4/5       Palpation  Palpation comment  Soreness, tirigger points, and hypertonicity of bil UTs, and into R rhomboid;  Minimal pain in central cervical region, sub occipitals, or Spinous processes.       Special Tests   Other special tests  Cerv compression/distraction negative, Distraction: "feels good".   Carpal tunnel testing: TBD:                  Objective measurements completed on examination: See above findings.      [facility] Adult PT Treatment/Exercise - 07/02/18 0001      Exercises   Exercises  Neck      Neck Exercises: Seated   Neck Retraction  20 reps    Other Seated Exercise  Scap retraction x20;       Neck Exercises: Stretches   Upper Trapezius Stretch  3 reps;30 seconds    Chest Stretch  3 reps;30 seconds    Chest Stretch Limitations  supine;              PT Education - 07/02/18 1229    Education Details  PT POC, HEP    Person(s) Educated  Patient    Methods  Explanation;Handout;Verbal cues    Comprehension  Verbalized understanding;Tactile cues required;Need further instruction       PT Short Term Goals - 07/02/18 1233      PT SHORT TERM GOAL #1   Title  Pt to be independent with initial HEP      Time  2    Period  Weeks    Status  New    Target Date  07/12/18      PT SHORT TERM GOAL #2   Title  Pt to report decreased pain in neck to 0-4/10     Time  2    Period  Weeks    Status  New    Target Date  07/12/18        PT Long Term Goals - 07/02/18 1234      PT LONG TERM GOAL #1   Title  Pt to be independent with final HEP    Time  6    Period  Weeks    Status  New    Target Date  08/09/18      PT LONG TERM GOAL #2   Title  Pt to report decreased pain in neck and UE, to 0-2/10 with activity     Time  6    Period  Weeks    Status  New    Target Date  08/09/18      PT LONG TERM GOAL #3   Title  Pt to demo increased strength of shoulders and postural muscles to at least 4+/5 to improve pain and posture.       PT LONG TERM GOAL #4   Title  Pt to demo proper lifting, carrying techniques, to improve pain and posture with work duties.     Time  6    Period  Weeks    Status  New    Target Date  08/09/18             Plan - 07/02/18 1236    Clinical Impression Statement  Pt presents with primary complaint of increased pain in cervical region, as well as tightness and soreness into bil Upper traps. Pt with increasd muscle spasms and hypertonicity to same region. Pt with decreased posture in seated and standing, and will benetif from education and practice with this. Pt with lack of  effective HEP for her Diagnosis. She also has tingling into Bil hands R>L,  will continue to assess for symptoms stemming from carpal tunnel vs cervical radiculopathy. Pt with decreased ROM in cervical region, due to tightness and pain. She has decreased ability for full finctional activities, and work duties, due to pain and deficits. Pt to benefit from skilled PT to improve deficits and return to PLOF without pain.     Clinical Presentation  Stable    Clinical Decision Making  Low    Rehab Potential  Good    PT Frequency  2x / week    PT Duration  6 weeks    PT Treatment/Interventions   ADLs/Self Care Home Management;Cryotherapy;Electrical Stimulation;Iontophoresis 4mg /ml Dexamethasone;Functional mobility training;Patient/family education;Ultrasound;Traction;Moist Heat;Therapeutic activities;Therapeutic exercise;Neuromuscular re-education;Passive range of motion;Manual techniques;Dry needling;Taping    Consulted and Agree with Plan of Care  Patient       Patient will benefit from skilled therapeutic intervention in order to improve the following deficits and impairments:  Decreased range of motion, Increased muscle spasms, Decreased endurance, Decreased activity tolerance, Pain, Impaired flexibility, Improper body mechanics, Decreased mobility, Decreased strength  Visit Diagnosis: Cervicalgia     Problem List Patient Active Problem List   Diagnosis Date Noted  . Cervical radiculopathy 04/20/2018  . Chronic periscapular pain on right side 01/01/2018  . Hematochezia 09/06/2017  . Obesity (BMI 30.0-34.9) 09/17/2015  . Breast mass in female 03/20/2015  . Chronic right-sided low back pain without sciatica 08/01/2014  . Palpitations 03/06/2014  . Meniere's disease (cochlear hydrops) 01/25/2013  . Allergic rhinitis 04/10/2012  . Atypical migraine 05/02/2011  . Tobacco use disorder 03/09/2010  . Papanicolaou smear of cervix with positive high risk human papilloma virus (HPV) test 12/06/2007  . GERD 10/16/2007  . Benign essential HTN 02/07/2007  . Mild intermittent asthma 02/07/2007   Sedalia Muta, PT, DPT 12:46 PM  07/02/18    [facility] [address] [place], [place], [address] Phone: [phone]   Fax:  [phone]  Name: Jessica Hale MRN: [id number] Date of Birth: 1976-02-22

## 2018-07-05 ENCOUNTER — Encounter: Payer: Self-pay | Admitting: Physical Therapy

## 2018-07-05 ENCOUNTER — Ambulatory Visit: Payer: 59 | Admitting: Physical Therapy

## 2018-07-05 DIAGNOSIS — M542 Cervicalgia: Secondary | ICD-10-CM

## 2018-07-06 NOTE — Therapy (Signed)
[facility] [address] [place], [place], [address] Phone: [phone]   Fax:  [phone]  Physical Therapy Treatment  Patient Details  Name: Jessica Hale MRN: [id number] Date of Birth: 1976-03-17 Referring Provider (PT): Helane Rima   Encounter Date: 07/05/2018  PT End of Session - 07/05/18 1438    Visit Number  2    Number of Visits  12    Date for PT Re-Evaluation  08/09/18    Authorization Type  UHC    PT Start Time  1432    PT Stop Time  1513    PT Time Calculation (min)  41 min    Activity Tolerance  Patient tolerated treatment well    Behavior During Therapy  [facility] for tasks assessed/performed       Past Medical History:  Diagnosis Date  . Anemia   . Asthma   . GERD (gastroesophageal reflux disease)   . Hypertension   . Migraine headache     Past Surgical History:  Procedure Laterality Date  . KNEE SURGERY     Age 42    There were no vitals filed for this visit.  Subjective Assessment - 07/05/18 1429    Subjective  Pt states mild pain in neck today. She reports decreased tingling sensation into hands today.     Patient Stated Goals  decreased pain    Currently in Pain?  Yes    Pain Score  1     Pain Location  Neck    Pain Orientation  Right;Left    Pain Descriptors / Indicators  Aching    Pain Type  Chronic pain    Pain Onset  More than a month ago    Pain Frequency  Intermittent    Pain Score  1    Pain Location  Hand    Pain Orientation  Right;Left    Pain Descriptors / Indicators  Tightness    Pain Type  Chronic pain    Pain Onset  More than a month ago    Pain Frequency  Intermittent                       [facility] Adult PT Treatment/Exercise - 07/05/18 1438      Exercises   Exercises  Neck;Wrist      Neck Exercises: Theraband   Rows  20 reps;Green      Neck Exercises: Seated   Neck Retraction  20 reps    Other Seated Exercise  --      Wrist Exercises   Other wrist  exercises  Prayer stretch for wrist extension 30 sec x3;       Manual Therapy   Manual Therapy  Joint mobilization;Manual Traction;Passive ROM;Soft tissue mobilization    Joint Mobilization  PA mobs c-spine for pain gr 2    Soft tissue mobilization  STM/DTM to Bil UTs     Passive ROM  Manual upper trap stretching     Manual Traction  cervical: 10 sec x8;       Neck Exercises: Stretches   Upper Trapezius Stretch  3 reps;30 seconds    Corner Stretch  3 reps;30 seconds    Chest Stretch  3 reps;30 seconds    Chest Stretch Limitations  supine; THen x10 wrist flex/ext for nerve glides.                PT Short Term Goals - 07/02/18 1233  PT SHORT TERM GOAL #1   Title  Pt to be independent with initial HEP     Time  2    Period  Weeks    Status  New    Target Date  07/12/18      PT SHORT TERM GOAL #2   Title  Pt to report decreased pain in neck to 0-4/10     Time  2    Period  Weeks    Status  New    Target Date  07/12/18        PT Long Term Goals - 07/02/18 1234      PT LONG TERM GOAL #1   Title  Pt to be independent with final HEP    Time  6    Period  Weeks    Status  New    Target Date  08/09/18      PT LONG TERM GOAL #2   Title  Pt to report decreased pain in neck and UE, to 0-2/10 with activity     Time  6    Period  Weeks    Status  New    Target Date  08/09/18      PT LONG TERM GOAL #3   Title  Pt to demo increased strength of shoulders and postural muscles to at least 4+/5 to improve pain and posture.       PT LONG TERM GOAL #4   Title  Pt to demo proper lifting, carrying techniques, to improve pain and posture with work duties.     Time  6    Period  Weeks    Status  New    Target Date  08/09/18            Plan - 07/06/18 0827    Clinical Impression Statement  Pt with + tinels test for carpal tunnel symptoms today. Pt educated on wrist extension stretches and nerve glides. Pt with sigificant tenderness and trigger points in Upper  traps today, manual done to decrease. Plan to progress ther ex for pain, posture, and ROM.     Rehab Potential  Good    PT Frequency  2x / week    PT Duration  6 weeks    PT Treatment/Interventions  ADLs/Self Care Home Management;Cryotherapy;Electrical Stimulation;Iontophoresis 4mg /ml Dexamethasone;Functional mobility training;Patient/family education;Ultrasound;Traction;Moist Heat;Therapeutic activities;Therapeutic exercise;Neuromuscular re-education;Passive range of motion;Manual techniques;Dry needling;Taping    Consulted and Agree with Plan of Care  Patient       Patient will benefit from skilled therapeutic intervention in order to improve the following deficits and impairments:  Decreased range of motion, Increased muscle spasms, Decreased endurance, Decreased activity tolerance, Pain, Impaired flexibility, Improper body mechanics, Decreased mobility, Decreased strength  Visit Diagnosis: Cervicalgia     Problem List Patient Active Problem List   Diagnosis Date Noted  . Cervical radiculopathy 04/20/2018  . Chronic periscapular pain on right side 01/01/2018  . Hematochezia 09/06/2017  . Obesity (BMI 30.0-34.9) 09/17/2015  . Breast mass in female 03/20/2015  . Chronic right-sided low back pain without sciatica 08/01/2014  . Palpitations 03/06/2014  . Meniere's disease (cochlear hydrops) 01/25/2013  . Allergic rhinitis 04/10/2012  . Atypical migraine 05/02/2011  . Tobacco use disorder 03/09/2010  . Papanicolaou smear of cervix with positive high risk human papilloma virus (HPV) test 12/06/2007  . GERD 10/16/2007  . Benign essential HTN 02/07/2007  . Mild intermittent asthma 02/07/2007   Sedalia MutaLauren Senay Sistrunk, PT, DPT 8:29 AM  07/06/18    [facility] [address] [place], [place], [address] Phone: [phone]   Fax:  [phone]  Name: Jessica Hale MRN: [id number] Date of Birth: 06/19/1976

## 2018-07-13 ENCOUNTER — Encounter: Payer: 59 | Admitting: Physical Therapy

## 2018-07-20 ENCOUNTER — Encounter: Payer: Self-pay | Admitting: Family Medicine

## 2018-07-20 ENCOUNTER — Ambulatory Visit: Payer: 59 | Admitting: Family Medicine

## 2018-07-20 VITALS — BP 140/88 | HR 98 | Temp 98.6°F | Ht 62.0 in | Wt 175.4 lb

## 2018-07-20 DIAGNOSIS — F5101 Primary insomnia: Secondary | ICD-10-CM | POA: Diagnosis not present

## 2018-07-20 DIAGNOSIS — G5602 Carpal tunnel syndrome, left upper limb: Secondary | ICD-10-CM | POA: Diagnosis not present

## 2018-07-20 DIAGNOSIS — M5412 Radiculopathy, cervical region: Secondary | ICD-10-CM

## 2018-07-20 DIAGNOSIS — F17201 Nicotine dependence, unspecified, in remission: Secondary | ICD-10-CM | POA: Diagnosis not present

## 2018-07-20 MED ORDER — ZOLPIDEM TARTRATE 5 MG PO TABS: 5.0000 mg | ORAL_TABLET | Freq: Every evening | ORAL | 0 refills | Status: DC | PRN

## 2018-07-20 NOTE — Progress Notes (Signed)
Jessica Hale is a 43 y.o. female is here for follow up.  History of Present Illness:   Jessica Hale, CMA acting as scribe for Dr. Helane Rima.   HPI: Patient in office for follow up on back pain. She has had two ESI with some improvement. Her pain level has decreased in severity and frequency.  Modified work is very helpful - hopes to continue with same. Left wrist pain thought to be carpal tunnel, was told to see Dr. Berline Chough. PT great. Stopped smoking!  Health Maintenance Due  Topic Date Due  . PAP SMEAR-Modifier  06/02/2018   Depression screen [facility] 2/9 12/29/2017 06/22/2017 06/02/2017  Decreased Interest 0 0 0  Down, Depressed, Hopeless 0 0 0  PHQ - 2 Score 0 0 0  Altered sleeping - - -  Tired, decreased energy - - -  Change in appetite - - -  Feeling bad or failure about yourself  - - -  Trouble concentrating - - -  Moving slowly or fidgety/restless - - -  Suicidal thoughts - - -  PHQ-9 Score - - -   PMHx, SurgHx, SocialHx, FamHx, Medications, and Allergies were reviewed in the Visit Navigator and updated as appropriate.   Patient Active Problem List   Diagnosis Date Noted  . Cervical radiculopathy 04/20/2018  . Chronic periscapular pain on right side 01/01/2018  . Hematochezia 09/06/2017  . Obesity (BMI 30.0-34.9) 09/17/2015  . Breast mass in female 03/20/2015  . Chronic right-sided low back pain without sciatica 08/01/2014  . Palpitations 03/06/2014  . Meniere's disease (cochlear hydrops) 01/25/2013  . Allergic rhinitis 04/10/2012  . Atypical migraine 05/02/2011  . Tobacco use disorder 03/09/2010  . Papanicolaou smear of cervix with positive high risk human papilloma virus (HPV) test 12/06/2007  . GERD 10/16/2007  . Benign essential HTN 02/07/2007  . Mild intermittent asthma 02/07/2007   Social History   Tobacco Use  . Smoking status: Current Every Day Smoker    Packs/day: 0.25    Years: 5.00    Pack years: 1.25    Types: Cigarettes  . Smokeless  tobacco: Never Used  Substance Use Topics  . Alcohol use: Yes    Alcohol/week: 0.0 standard drinks    Comment: occasional  . Drug use: Not Currently    Types: Marijuana   Current Medications and Allergies:   Current Outpatient Medications:  .  albuterol (VENTOLIN HFA) 108 (90 Base) MCG/ACT inhaler, Inhale 2 puffs into the lungs every 6 (six) hours as needed for wheezing or shortness of breath., Disp: 1 Inhaler, Rfl: 1 .  beclomethasone (QVAR REDIHALER) 40 MCG/ACT inhaler, Inhale 2 puffs into the lungs 2 (two) times daily., Disp: 2 Inhaler, Rfl: 6 .  EPIPEN 2-PAK 0.3 MG/0.3ML SOAJ injection, inject as directed WITH BEE STING, Disp: , Rfl: 0 .  hydrochlorothiazide (HYDRODIURIL) 25 MG tablet, Take 0.5 tablets (12.5 mg total) by mouth daily., Disp: 30 tablet, Rfl: 11 .  metoprolol tartrate (LOPRESSOR) 25 MG tablet, Take 0.5 tablets (12.5 mg total) by mouth 2 (two) times daily., Disp: 60 tablet, Rfl: 11 .  naproxen (NAPROSYN) 500 MG tablet, Take 1 tablet (500 mg total) by mouth 2 (two) times daily with a meal., Disp: 60 tablet, Rfl: 3 .  omeprazole (PRILOSEC) 40 MG capsule, TAKE 1 CAPSULE(40 MG) BY MOUTH DAILY, Disp: 30 capsule, Rfl: 3 .  zolpidem (AMBIEN) 5 MG tablet, Take 1 tablet (5 mg total) by mouth at bedtime as needed. for sleep, Disp: 30 tablet, Rfl:  0   Allergies  Allergen Reactions  . Bee Venom Anaphylaxis   Review of Systems   Pertinent items are noted in the HPI. Otherwise, a complete ROS is negative.  Vitals:   Vitals:   07/20/18 1022  BP: 140/88  Pulse: 98  Temp: 98.6 F (37 C)  TempSrc: Oral  SpO2: 100%  Weight: 175 lb 6.4 oz (79.6 kg)  Height: 5\' 2"  (1.575 m)     Body mass index is 32.08 kg/m.  Physical Exam:   Physical Exam Vitals signs and nursing note reviewed.  HENT:     Head: Normocephalic and atraumatic.  Eyes:     Pupils: Pupils are equal, round, and reactive to light.  Neck:     Musculoskeletal: Normal range of motion and neck supple.    Cardiovascular:     Rate and Rhythm: Normal rate and regular rhythm.     Heart sounds: Normal heart sounds.  Pulmonary:     Effort: Pulmonary effort is normal.  Abdominal:     Palpations: Abdomen is soft.  Skin:    General: Skin is warm.  Psychiatric:        Behavior: Behavior normal.    Assessment and Plan:   Jessica Hale was seen today for follow-up.  Diagnoses and all orders for this visit:  Cervical radiculopathy Comments: Injections very helpful. Also stretching, ice, PT, and CBD.   Primary insomnia Comments: Refill today. Orders: -     zolpidem (AMBIEN) 5 MG tablet; Take 1 tablet (5 mg total) by mouth at bedtime as needed. for sleep  Carpal tunnel syndrome of left wrist Comments: Will make appt with Dr. Berline Chough to evaluate. Orders: -     Ambulatory referral to Sports Medicine  Tobacco dependence in remission Comments: Stopped smoking to support her wife. Doing well.    . Orders and follow up as documented in EpicCare, reviewed diet, exercise and weight control, cardiovascular risk and specific lipid/LDL goals reviewed, reviewed medications and side effects in detail.  . Reviewed expectations re: course of current medical issues. . Outlined signs and symptoms indicating need for more acute intervention. . Patient verbalized understanding and all questions were answered. . Patient received an After Visit Summary.  CMA served as Neurosurgeon during this visit. History, Physical, and Plan performed by medical provider. The above documentation has been reviewed and is accurate and complete. Helane Rima, D.O.  Helane Rima, DO [facility] 07/20/2018

## 2018-07-26 ENCOUNTER — Ambulatory Visit: Payer: 59 | Admitting: Physical Therapy

## 2018-07-26 DIAGNOSIS — M542 Cervicalgia: Secondary | ICD-10-CM | POA: Diagnosis not present

## 2018-07-30 ENCOUNTER — Encounter: Payer: Self-pay | Admitting: Physical Therapy

## 2018-07-30 NOTE — Therapy (Signed)
[facility] [address] [place], [place], [address] Phone: [phone]   Fax:  [phone]  Physical Therapy Treatment  Patient Details  Name: Jessica Hale [place]lington MRN: [id number] Date of Birth: 10-18-1975 Referring Provider (PT): Helane RimaErica Wallace   Encounter Date: 07/26/2018  PT End of Session - 07/30/18 1608    Visit Number  3    Number of Visits  12    Date for PT Re-Evaluation  08/09/18    Authorization Type  [employer]    PT Start Time  1600    PT Stop Time  1644    PT Time Calculation (min)  44 min    Activity Tolerance  Patient tolerated treatment well    Behavior During Therapy  [facility]FL for tasks assessed/performed       Past Medical History:  Diagnosis Date  . Anemia   . Asthma   . GERD (gastroesophageal reflux disease)   . Hypertension   . Migraine headache     Past Surgical History:  Procedure Laterality Date  . KNEE SURGERY     Age 43    There were no vitals filed for this visit.  Subjective Assessment - 07/30/18 1607    Subjective  Pt last seen 12/19. She states neck has not been too sore, but has been having increased pain in last few days, and in her back as well. She has appt with sports med next friday.     Currently in Pain?  Yes    Pain Score  4     Pain Location  Neck    Pain Orientation  Right;Left    Pain Descriptors / Indicators  Aching    Pain Type  Chronic pain    Pain Onset  More than a month ago    Pain Frequency  Intermittent                       [facility] Adult PT Treatment/Exercise - 07/30/18 0001      Exercises   Exercises  Neck;Wrist      Neck Exercises: Theraband   Rows  20 reps;Green      Neck Exercises: Seated   Neck Retraction  20 reps      Neck Exercises: Supine   Shoulder Flexion  20 reps    Shoulder Flexion Limitations  cane      Manual Therapy   Manual Therapy  Joint mobilization;Manual Traction;Passive ROM;Soft tissue mobilization    Joint Mobilization  PA mobs  c-spine gr 3    Soft tissue mobilization  STM/DTM to Bil UTs , cervical paraspinals and sub occipitals.     Passive ROM  Manual upper trap stretching     Manual Traction  cervical: 10 sec x8;       Neck Exercises: Stretches   Upper Trapezius Stretch  3 reps;30 seconds    Corner Stretch  3 reps;30 seconds    Chest Stretch  3 reps;30 seconds    Chest Stretch Limitations  supine; THen x10 wrist flex/ext for nerve glides.                PT Short Term Goals - 07/02/18 1233      PT SHORT TERM GOAL #1   Title  Pt to be independent with initial HEP     Time  2    Period  Weeks    Status  New    Target Date  07/12/18  PT SHORT TERM GOAL #2   Title  Pt to report decreased pain in neck to 0-4/10     Time  2    Period  Weeks    Status  New    Target Date  07/12/18        PT Long Term Goals - 07/02/18 1234      PT LONG TERM GOAL #1   Title  Pt to be independent with final HEP    Time  6    Period  Weeks    Status  New    Target Date  08/09/18      PT LONG TERM GOAL #2   Title  Pt to report decreased pain in neck and UE, to 0-2/10 with activity     Time  6    Period  Weeks    Status  New    Target Date  08/09/18      PT LONG TERM GOAL #3   Title  Pt to demo increased strength of shoulders and postural muscles to at least 4+/5 to improve pain and posture.       PT LONG TERM GOAL #4   Title  Pt to demo proper lifting, carrying techniques, to improve pain and posture with work duties.     Time  6    Period  Weeks    Status  New    Target Date  08/09/18            Plan - 07/30/18 1610    Clinical Impression Statement  Pt with mild improvment of tenderness in upper cervical region. Continues to have soreness and tightness in UTs. Pt to benefit from dry needling at next session. Pt with increased pain in thoracic and rib region, seeing sports med soon, may benefit from manipulation. Pt to benefit from continued education on posture and strengthening.      Rehab Potential  Good    PT Frequency  2x / week    PT Duration  6 weeks    PT Treatment/Interventions  ADLs/Self Care Home Management;Cryotherapy;Electrical Stimulation;Iontophoresis 4mg /ml Dexamethasone;Functional mobility training;Patient/family education;Ultrasound;Traction;Moist Heat;Therapeutic activities;Therapeutic exercise;Neuromuscular re-education;Passive range of motion;Manual techniques;Dry needling;Taping    Consulted and Agree with Plan of Care  Patient       Patient will benefit from skilled therapeutic intervention in order to improve the following deficits and impairments:  Decreased range of motion, Increased muscle spasms, Decreased endurance, Decreased activity tolerance, Pain, Impaired flexibility, Improper body mechanics, Decreased mobility, Decreased strength  Visit Diagnosis: Cervicalgia     Problem List Patient Active Problem List   Diagnosis Date Noted  . Cervical radiculopathy 04/20/2018  . Chronic periscapular pain on right side 01/01/2018  . Hematochezia 09/06/2017  . Obesity (BMI 30.0-34.9) 09/17/2015  . Breast mass in female 03/20/2015  . Chronic right-sided low back pain without sciatica 08/01/2014  . Palpitations 03/06/2014  . Meniere's disease (cochlear hydrops) 01/25/2013  . Allergic rhinitis 04/10/2012  . Atypical migraine 05/02/2011  . Tobacco use disorder 03/09/2010  . Papanicolaou smear of cervix with positive high risk human papilloma virus (HPV) test 12/06/2007  . GERD 10/16/2007  . Benign essential HTN 02/07/2007  . Mild intermittent asthma 02/07/2007    Sedalia MutaLauren Shamus Desantis, PT, DPT 4:13 PM  07/30/18    [facility] [address] [place], [place], [address] Phone: [phone]   Fax:  [phone]  Name: Jessica Hale Mastrogiovanni MRN: [id number] Date of Birth: [birth year]

## 2018-08-02 ENCOUNTER — Encounter: Payer: 59 | Admitting: Physical Therapy

## 2018-08-03 ENCOUNTER — Ambulatory Visit: Payer: 59 | Admitting: Sports Medicine

## 2018-08-03 ENCOUNTER — Encounter: Payer: 59 | Admitting: Family Medicine

## 2018-08-06 ENCOUNTER — Encounter: Payer: Self-pay | Admitting: Family Medicine

## 2018-08-06 DIAGNOSIS — J4520 Mild intermittent asthma, uncomplicated: Secondary | ICD-10-CM

## 2018-08-06 MED ORDER — BECLOMETHASONE DIPROP HFA 40 MCG/ACT IN AERB: 2.0000 | INHALATION_SPRAY | Freq: Two times a day (BID) | RESPIRATORY_TRACT | 6 refills | Status: DC

## 2018-08-09 ENCOUNTER — Ambulatory Visit: Payer: 59 | Admitting: Physical Therapy

## 2018-08-09 ENCOUNTER — Encounter: Payer: Self-pay | Admitting: Physical Therapy

## 2018-08-09 DIAGNOSIS — M542 Cervicalgia: Secondary | ICD-10-CM

## 2018-08-12 ENCOUNTER — Encounter: Payer: Self-pay | Admitting: Physical Therapy

## 2018-08-12 NOTE — Therapy (Signed)
[facility] [address] [place], [place], [address] Phone: [phone]   Fax:  [phone]  Physical Therapy Treatment  Patient Details  Name: Jessica Hale MRN: [id number] Date of Birth: 1975/11/24 Referring Provider (PT): Helane Rima   Encounter Date: 08/09/2018    Past Medical History:  Diagnosis Date  . Anemia   . Asthma   . GERD (gastroesophageal reflux disease)   . Hypertension   . Migraine headache     Past Surgical History:  Procedure Laterality Date  . KNEE SURGERY     Age 28    There were no vitals filed for this visit.                    [facility] Adult PT Treatment/Exercise - 08/12/18 0001      Exercises   Exercises  Neck;Wrist      Neck Exercises: Theraband   Rows  20 reps;Green      Neck Exercises: Seated   Neck Retraction  20 reps      Neck Exercises: Supine   Other Supine Exercise  Horizontal Abd 2lb x20;       Neck Exercises: Prone   Other Prone Exercise  Prone Ts x 15 bil;       Manual Therapy   Manual Therapy  Joint mobilization;Manual Traction;Passive ROM;Soft tissue mobilization    Manual therapy comments  skilled palpation and monitoring of soft tissue during dry needling.     Joint Mobilization  PA mobs c-spine gr 3    Soft tissue mobilization  STM/DTM to Bil UTs , cervical paraspinals and sub occipitals.     Passive ROM  Manual upper trap stretching     Manual Traction  cervical: 10 sec x8;       Neck Exercises: Stretches   Upper Trapezius Stretch  3 reps;30 seconds       Trigger Point Dry Needling - 08/12/18 2153    Consent Given?  Yes    Education Handout Provided  Yes    Muscles Treated Upper Body  Upper trapezius    Upper Trapezius Response  Twitch reponse elicited;Palpable increased muscle length   Bilateral          PT Education - 08/12/18 2142    Education Details  Reviewed HEP    Person(s) Educated  Patient    Methods  Explanation    Comprehension  Verbalized understanding       PT Short Term Goals - 07/02/18 1233      PT SHORT TERM GOAL #1   Title  Pt to be independent with initial HEP     Time  2    Period  Weeks    Status  New    Target Date  07/12/18      PT SHORT TERM GOAL #2   Title  Pt to report decreased pain in neck to 0-4/10     Time  2    Period  Weeks    Status  New    Target Date  07/12/18        PT Long Term Goals - 07/02/18 1234      PT LONG TERM GOAL #1   Title  Pt to be independent with final HEP    Time  6    Period  Weeks    Status  New    Target Date  08/09/18      PT LONG TERM GOAL #2   Title  Pt to report decreased pain in neck and UE, to 0-2/10 with activity     Time  6    Period  Weeks    Status  New    Target Date  08/09/18      PT LONG TERM GOAL #3   Title  Pt to demo increased strength of shoulders and postural muscles to at least 4+/5 to improve pain and posture.       PT LONG TERM GOAL #4   Title  Pt to demo proper lifting, carrying techniques, to improve pain and posture with work duties.     Time  6    Period  Weeks    Status  New    Target Date  08/09/18            Plan - 08/12/18 2144    Clinical Impression Statement  Pt with trigger points and pain in Bil UTs today, addressed with manual therpay and dry needling. Progressed ther ex with scapular and postural strengthening. Reviewed importance of posture and HEP. Will assess effects of dry needling at next visit, pt may benefit from additional sessions of DN. Plan to progress as tolerated.     Rehab Potential  Good    PT Frequency  2x / week    PT Duration  6 weeks    PT Treatment/Interventions  ADLs/Self Care Home Management;Cryotherapy;Electrical Stimulation;Iontophoresis 4mg /ml Dexamethasone;Functional mobility training;Patient/family education;Ultrasound;Traction;Moist Heat;Therapeutic activities;Therapeutic exercise;Neuromuscular re-education;Passive range of motion;Manual techniques;Dry  needling;Taping    Consulted and Agree with Plan of Care  Patient       Patient will benefit from skilled therapeutic intervention in order to improve the following deficits and impairments:  Decreased range of motion, Increased muscle spasms, Decreased endurance, Decreased activity tolerance, Pain, Impaired flexibility, Improper body mechanics, Decreased mobility, Decreased strength  Visit Diagnosis: Cervicalgia     Problem List Patient Active Problem List   Diagnosis Date Noted  . Cervical radiculopathy 04/20/2018  . Chronic periscapular pain on right side 01/01/2018  . Hematochezia 09/06/2017  . Obesity (BMI 30.0-34.9) 09/17/2015  . Breast mass in female 03/20/2015  . Chronic right-sided low back pain without sciatica 08/01/2014  . Palpitations 03/06/2014  . Meniere's disease (cochlear hydrops) 01/25/2013  . Allergic rhinitis 04/10/2012  . Atypical migraine 05/02/2011  . Tobacco use disorder 03/09/2010  . Papanicolaou smear of cervix with positive high risk human papilloma virus (HPV) test 12/06/2007  . GERD 10/16/2007  . Benign essential HTN 02/07/2007  . Mild intermittent asthma 02/07/2007   Sedalia Muta, PT, DPT 9:54 PM  08/12/18    [facility] [address] [place], [place], [address] Phone: [phone]   Fax:  [phone]  Name: Jessica Hale MRN: [id number] Date of Birth: 1975-08-08

## 2018-08-16 NOTE — Progress Notes (Signed)
Subjective:    Jessica Hale is a 43 y.o. female and is here for a comprehensive physical exam.  Health Maintenance Due  Topic Date Due  . PAP SMEAR-Modifier  06/02/2018   Current Outpatient Medications:  .  albuterol (VENTOLIN HFA) 108 (90 Base) MCG/ACT inhaler, Inhale 2 puffs into the lungs every 6 (six) hours as needed for wheezing or shortness of breath., Disp: 1 Inhaler, Rfl: 1 .  beclomethasone (QVAR REDIHALER) 40 MCG/ACT inhaler, Inhale 2 puffs into the lungs 2 (two) times daily., Disp: 2 Inhaler, Rfl: 6 .  EPIPEN 2-PAK 0.3 MG/0.3ML SOAJ injection, inject as directed WITH BEE STING, Disp: , Rfl: 0 .  hydrochlorothiazide (HYDRODIURIL) 25 MG tablet, Take 0.5 tablets (12.5 mg total) by mouth daily., Disp: 30 tablet, Rfl: 11 .  metoprolol tartrate (LOPRESSOR) 25 MG tablet, Take 0.5 tablets (12.5 mg total) by mouth 2 (two) times daily., Disp: 60 tablet, Rfl: 11 .  naproxen (NAPROSYN) 500 MG tablet, Take 1 tablet (500 mg total) by mouth 2 (two) times daily with a meal., Disp: 60 tablet, Rfl: 3 .  omeprazole (PRILOSEC) 40 MG capsule, TAKE 1 CAPSULE(40 MG) BY MOUTH DAILY, Disp: 30 capsule, Rfl: 3 .  zolpidem (AMBIEN) 5 MG tablet, Take 1 tablet (5 mg total) by mouth at bedtime as needed. for sleep, Disp: 30 tablet, Rfl: 0  PMHx, SurgHx, SocialHx, Medications, and Allergies were reviewed in the Visit Navigator and updated as appropriate.   Past Medical History:  Diagnosis Date  . Anemia   . Asthma   . GERD (gastroesophageal reflux disease)   . Hypertension   . Migraine headache      Past Surgical History:  Procedure Laterality Date  . KNEE SURGERY     Age 43     Family History  Problem Relation Age of Onset  . Heart attack Maternal Grandmother   . Hypertension Maternal Grandmother   . Lung cancer Maternal Aunt   . Colon cancer Neg Hx     Social History   Tobacco Use  . Smoking status: Current Every Day Smoker    Packs/day: 0.25    Years: 5.00    Pack years: 1.25      Types: Cigarettes  . Smokeless tobacco: Never Used  Substance Use Topics  . Alcohol use: Yes    Alcohol/week: 0.0 standard drinks    Comment: occasional  . Drug use: Not Currently    Types: Marijuana    Review of Systems:   Pertinent items are noted in the HPI. Otherwise, ROS is negative.  Objective:   BP 128/82   Pulse 77   Temp 98.6 F (37 C) (Oral)   Ht 5\' 2"  (1.575 m)   Wt 167 lb (75.8 kg)   SpO2 98%   BMI 30.54 kg/m   General appearance: alert, cooperative and appears stated age. Head: normocephalic, without obvious abnormality, atraumatic. Neck: no adenopathy, supple, symmetrical, trachea midline; thyroid not enlarged, symmetric, no tenderness/mass/nodules. Lungs: clear to auscultation bilaterally. Heart: regular rate and rhythm Abdomen: soft, non-tender; no masses,  no organomegaly. Extremities: extremities normal, atraumatic, no cyanosis or edema. Skin: skin color, texture, turgor normal, no rashes or lesions. Lymph: cervical, supraclavicular, and axillary nodes normal; no abnormal inguinal nodes palpated. Neurologic: grossly normal.                Assessment/Plan:   Jessica Hale was seen today for annual exam.  Diagnoses and all orders for this visit:  Routine physical examination  Screening for  cervical cancer -     Cytology - PAP( [place])   Patient Counseling: [x]    Nutrition: Stressed importance of moderation in sodium/caffeine intake, saturated fat and cholesterol, caloric balance, sufficient intake of fresh fruits, vegetables, fiber, calcium, iron, and 1 mg of folate supplement per day (for females capable of pregnancy).  [x]    Stressed the importance of regular exercise.   [x]    Substance Abuse: Discussed cessation/primary prevention of tobacco, alcohol, or other drug use; driving or other dangerous activities under the influence; availability of treatment for abuse.   [x]    Injury prevention: Discussed safety belts, safety helmets, smoke  detector, smoking near bedding or upholstery.   [x]    Sexuality: Discussed sexually transmitted diseases, partner selection, use of condoms, avoidance of unintended pregnancy  and contraceptive alternatives.  [x]    Dental health: Discussed importance of regular tooth brushing, flossing, and dental visits.  [x]    Health maintenance and immunizations reviewed. Please refer to Health maintenance section.   Jessica RimaErica Jomarion Mish, DO [facility]

## 2018-08-17 ENCOUNTER — Ambulatory Visit (INDEPENDENT_AMBULATORY_CARE_PROVIDER_SITE_OTHER): Payer: 59 | Admitting: Family Medicine

## 2018-08-17 ENCOUNTER — Other Ambulatory Visit (HOSPITAL_COMMUNITY)
Admission: RE | Admit: 2018-08-17 | Discharge: 2018-08-17 | Disposition: A | Discharge status: Home / Self Care | Payer: 59 | Source: Ambulatory Visit | Attending: Family Medicine | Admitting: Family Medicine

## 2018-08-17 ENCOUNTER — Ambulatory Visit: Payer: 59 | Admitting: Sports Medicine

## 2018-08-17 ENCOUNTER — Encounter: Payer: Self-pay | Admitting: Sports Medicine

## 2018-08-17 ENCOUNTER — Ambulatory Visit: Payer: Self-pay

## 2018-08-17 VITALS — BP 128/82 | HR 77 | Temp 98.6°F | Ht 62.0 in | Wt 167.0 lb

## 2018-08-17 VITALS — BP 128/82 | HR 77

## 2018-08-17 DIAGNOSIS — M5412 Radiculopathy, cervical region: Secondary | ICD-10-CM | POA: Diagnosis not present

## 2018-08-17 DIAGNOSIS — G5602 Carpal tunnel syndrome, left upper limb: Secondary | ICD-10-CM

## 2018-08-17 DIAGNOSIS — Z0000 Encounter for general adult medical examination without abnormal findings: Secondary | ICD-10-CM | POA: Diagnosis not present

## 2018-08-17 DIAGNOSIS — M25511 Pain in right shoulder: Secondary | ICD-10-CM

## 2018-08-17 DIAGNOSIS — Z124 Encounter for screening for malignant neoplasm of cervix: Secondary | ICD-10-CM

## 2018-08-17 DIAGNOSIS — G5601 Carpal tunnel syndrome, right upper limb: Secondary | ICD-10-CM | POA: Diagnosis not present

## 2018-08-17 DIAGNOSIS — G8929 Other chronic pain: Secondary | ICD-10-CM

## 2018-08-17 NOTE — Progress Notes (Signed)
Jessica Hale. Jessica Hale [facility] [phone]  Jessica Hale - 43 y.o. female MRN [id number]  Date of birth: 1976/02/15  Visit Date: August 17, 2018  PCP: Helane Rima, DO   Referred by: Helane Rima, DO  SUBJECTIVE:  Chief Complaint  Patient presents with  . Left Wrist - Initial Assessment    MRI C-spine 03/31/2018. IBU prn. Carpal Tunnel brace at bedtime.  . Initial Assessment    Referred by Dr. Helane Rima.     HPI: Patient presents with the above symptoms.  She has had both neck and bilateral hand pain for quite some time.  It is been progressively worsening but did improve with the epidural steroid injections.  The pain can be so severe that she has to have her wife rub her hands first thing in the morning.  She has been using night splints without significant improvement.  She is reporting numbness is occasional weakness.  Pain radiates from her neck into her hands in the median nerve distribution.  Seems to be worse with repetitive work.  Heat and ice have been only minimally helpful.  Ibuprofen is of no significant benefit.  She has had nighttime disturbances due to this with waking up with numbness and tingling first in the morning 2 to 3 days/week.  She occasionally has some dizziness and lower extremity edema that is related to headaches and chronic, intermittent.  REVIEW OF SYSTEMS: Per HPI  Otherwise 12 point review of systems performed and is negative   HISTORY:  Prior history reviewed and updated per electronic medical record.  Social History   Occupational History  . Not on file  Tobacco Use  . Smoking status: Current Every Day Smoker    Packs/day: 0.25    Years: 5.00    Pack years: 1.25    Types: Cigarettes  . Smokeless tobacco: Never Used  Substance and Sexual Activity  . Alcohol use: Yes    Alcohol/week: 0.0 standard drinks    Comment: occasional  . Drug use: Not Currently    Types:  Marijuana  . Sexual activity: Yes    Partners: Female    Comment: Lesbian   Social History   Social History Narrative  . Not on file   Past Medical History:  Diagnosis Date  . Anemia   . Asthma   . GERD (gastroesophageal reflux disease)   . Hypertension   . Migraine headache    Past Surgical History:  Procedure Laterality Date  . KNEE SURGERY     Age 73   family history includes Heart attack in her maternal grandmother; Hypertension in her maternal grandmother; Lung cancer in her maternal aunt. There is no history of Colon cancer.  DATA OBTAINED & REVIEWED:  Recent Labs    12/29/17 1400 04/20/18 1013  HGBA1C  --  5.5  CALCIUM 9.7  --   AST 14  --   ALT 17  --   TSH 2.04  --     Problem  Carpal Tunnel Syndrome On Right   No specialty comments available. MRI with minimally compressive findings.  OBJECTIVE:  VS:  HT:    WT:   BMI:     BP:128/82  HR:77bpm  TEMP: ( )  RESP:98 %   PHYSICAL EXAM: CONSTITUTIONAL: Well-developed, Well-nourished and In no acute distress EYES: Pupils are equal., EOM intact without nystagmus. and No scleral icterus. Psychiatric: Alert & appropriately interactive. and Not depressed or anxious appearing.  EXTREMITY EXAM: Warm and well perfused  Bilateral hands are well aligned without significant deformity.  There is possibly trace thenar atrophy but this is mild.  Pain with carpal tunnel compression test with positive radiation.  Pain with Tinel's.  Left is more symptomatic than right.  Grip strength is 5/5.  Radial pulses and ulnar pulses 2+/4.    ASSESSMENT  1. Carpal tunnel syndrome of left wrist   2. Cervical radiculopathy   3. Chronic periscapular pain on right side   4. Carpal tunnel syndrome on right     PROCEDURES:  US Guided Injection per procedure note      PLAN:  Pertinent additional documentation may be included in corresponding procedure notes, imaging studies, problem based documentation and patient  instructions.  No problem-specific Assessment & Plan notes found for this encounter.  Symptoms are consistent with bilateral double crush syndrome.  She has mild findings of carpal tunnel on ultrasound evaluation.  She also has markedly tight anterior chain and will benefit from foundations training.  Continue with physical therapy  Links to [employer] Training videos provided today per Patient Instructions.  These exercises were developed by Myles LippsEric Goodman, DC with a strong emphasis on core neuromuscular reducation and postural realignment through body-weight exercises.  Discussed the underlying features of tight hip flexors leading to crouched, fetal like position that results in spinal column compression.  Including lumbar hyperflexion with hypermobility, thoracic flexion with restrictive rotation and cervical lordosis reversal.   Continue with nighttime splints.  Apply heat (do not to sleep on heating pad)  Activity modifications and the importance of avoiding exacerbating activities (limiting pain to no more than a 4 / 10 during or following activity) recommended and discussed.  Discussed red flag symptoms that warrant earlier emergent evaluation and patient voices understanding.   At follow up will plan to consider: Referral for nerve conduction studies if any lack of improvement.  Return in about 6 weeks (around 09/28/2018).          Andrena MewsMichael D Rigby, DO    [facility]

## 2018-08-17 NOTE — Procedures (Signed)
PROCEDURE NOTE:  Ultrasound Guided: Injection: Bilateral Carpal tunnel injections Images were obtained and interpreted by myself, Gaspar Bidding, DO  Images have been saved and stored to PACS system. Images obtained on: GE S7 Ultrasound machine    ULTRASOUND FINDINGS:  Bilateral median nerve measures 0.11 cm.  This is consistent with mild to moderate carpal tunnel syndrome  DESCRIPTION OF PROCEDURE:  The patient's clinical condition is marked by substantial pain and/or significant functional disability. Other conservative therapy has not provided relief, is contraindicated, or not appropriate. There is a reasonable likelihood that injection will significantly improve the patient's pain and/or functional impairment.   After discussing the risks, benefits and expected outcomes of the injection and all questions were reviewed and answered, the patient wished to undergo the above named procedure.  Verbal consent was obtained.  The ultrasound was used to identify the target structure and adjacent neurovascular structures. The skin was then prepped in sterile fashion and the target structure was injected under direct visualization using sterile technique as below:  Bilateral injections performed under identical technique as below: PREP: Alcohol and Ethel Chloride APPROACH:ulnar sided, single injection, 25g 1.5 in. INJECTATE: 1 cc 1% lidocaine, 0.5 cc 0.5% Marcaine and 0.5 cc 40mg /mL DepoMedrol ASPIRATE: None DRESSING: Band-Aid  Post procedural instructions including recommending icing and warning signs for infection were reviewed.    This procedure was well tolerated and there were no complications.   IMPRESSION: Succesful Ultrasound Guided: Injection

## 2018-08-17 NOTE — Patient Instructions (Addendum)
Also check out [employer]"Foundation Training" which is a program developed by Dr. Myles LippsEric Goodman.   There are links to a couple of his YouTube Videos below and I would like to see you performing one of his videos 5-6 days per week.  It is best to do these exercises first thing in the morning.  They will give you a good jumpstart here today and start normalizing the way you move.  A good intro video is: "Independence from Pain 7-minute Video" - https://riley.org/https://www.youtube.com/watch?v=V179hqrkFJ0   A more advanced video is: Scientist, research (medical)"Foundation training original 12 minutes" - OilGuides.com.eehttps://www.youtube.com/watch?v=4BOTvaRaDjI  Exercises that focus more on the neck are as below: Dr. Derrill KayGoodman with [employer] [employer] teaching neck and shoulder details Part 1 - https://youtu.be/cTk8PpDogq0 Part 2 Dr. Derrill KayGoodman with [facility] quick routine to practice daily - https://youtu.be/Y63sa6ETT6s  Do not try to attempt the entire video when first beginning.  Try breaking of each exercise that he goes into shorter segments.  In other words, if they perform an exercise for 45 seconds, start with 15 seconds and rest and then resume when they begin the new activity.  If you work your way up to being able to do these videos without having to stop, I expect you will see significant improvements in your pain.  If you enjoy his videos and would like to find out more you can look on his website: [email or web address].  He has a workout streaming option as well as a DVD set available for purchase.  Amazon has the best price for his DVDs.        You had an injection today.  Things to be aware of after injection are listed below: . You may experience no significant improvement or even a slight worsening in your symptoms during the first 24 to 48 hours.  After that we expect your symptoms to improve gradually over the next 2 weeks for the medicine to have its maximal effect.  You should continue to have improvement out to 6 weeks after your  injection. . Dr. Berline Choughigby recommends icing the site of the injection for 20 minutes  1-2 times the day of your injection . You may shower but no swimming, tub bath or Jacuzzi for 24 hours. . If your bandage falls off this does not need to be replaced.  It is appropriate to remove the bandage after 4 hours. . You may resume light activities as tolerated unless otherwise directed per Dr. Berline Choughigby during your visit  POSSIBLE STEROID SIDE EFFECTS:  Side effects from injectable steroids tend to be less than when taken orally however you may experience some of the symptoms listed below.  If experienced these should only last for a short period of time. Change in menstrual flow  Edema (swelling)  Increased appetite Skin flushing (redness)  Skin rash/acne  Thrush (oral) Yeast vaginitis    Increased sweating  Depression Increased blood glucose levels Cramping and leg/calf  Euphoria (feeling happy)  POSSIBLE PROCEDURE SIDE EFFECTS: The side effects of the injection are usually fairly minimal however if you may experience some of the following side effects that are usually self-limited and will is off on their own.  If you are concerned please feel free to call the office with questions:  Increased numbness or tingling  Nausea or vomiting  Swelling or bruising at the injection site   Please call our office if if you experience any of the following symptoms over the next week as these can be signs of infection:  Fever greater than 100.4F  Significant swelling at the injection site  Significant redness or drainage from the injection site  If after 2 weeks you are continuing to have worsening symptoms please call our office to discuss what the next appropriate actions should be including the potential for a return office visit or other diagnostic testing.

## 2018-08-18 ENCOUNTER — Encounter: Payer: Self-pay | Admitting: Family Medicine

## 2018-08-20 ENCOUNTER — Other Ambulatory Visit: Payer: Self-pay | Admitting: Family Medicine

## 2018-08-20 DIAGNOSIS — F5101 Primary insomnia: Secondary | ICD-10-CM

## 2018-08-21 LAB — CYTOLOGY - PAP
Adequacy: ABSENT
Bacterial vaginitis: POSITIVE — AB
Candida vaginitis: NEGATIVE
Diagnosis: NEGATIVE
HPV: NOT DETECTED

## 2018-08-23 ENCOUNTER — Encounter: Payer: Self-pay | Admitting: Physical Therapy

## 2018-08-23 ENCOUNTER — Encounter: Payer: Self-pay | Admitting: Family Medicine

## 2018-08-23 ENCOUNTER — Ambulatory Visit: Payer: 59 | Admitting: Physical Therapy

## 2018-08-23 DIAGNOSIS — M542 Cervicalgia: Secondary | ICD-10-CM | POA: Diagnosis not present

## 2018-08-28 ENCOUNTER — Encounter: Payer: Self-pay | Admitting: Physical Therapy

## 2018-08-28 NOTE — Therapy (Signed)
[facility] [address] [place], [place], [address] Phone: [phone]   Fax:  [phone]  Physical Therapy Treatment/Re-Cert    Patient Details  Name: Jessica Hale MRN: [id number] Date of Birth: 11/03/1975 Referring Provider (PT): Briscoe Deutscher   Encounter Date: 08/23/2018  PT End of Session - 08/28/18 0962    Visit Number  5    Number of Visits  12    Date for PT Re-Evaluation  09/20/18    Authorization Type  UHC    PT Start Time  1602    PT Stop Time  1645    PT Time Calculation (min)  43 min    Activity Tolerance  Patient tolerated treatment well    Behavior During Therapy  [facility] for tasks assessed/performed       Past Medical History:  Diagnosis Date  . Anemia   . Asthma   . GERD (gastroesophageal reflux disease)   . Hypertension   . Migraine headache     Past Surgical History:  Procedure Laterality Date  . KNEE SURGERY     Age 43    There were no vitals filed for this visit.  Subjective Assessment - 08/28/18 0820    Subjective  Pt states mild improvments in neck pain, but bil shoulder blade region has been very sore. She continues to work light duty. She reports doing HEP and postural exercises while at work.     Limitations  Lifting;Standing;House hold activities    Patient Stated Goals  decreased pain    Currently in Pain?  Yes    Pain Score  5     Pain Location  Neck    Pain Orientation  Right;Left    Pain Descriptors / Indicators  Aching;Tightness    Pain Type  Chronic pain    Pain Onset  More than a month ago    Pain Frequency  Intermittent    Aggravating Factors   work, reaching, liftin    Pain Relieving Factors  heat                       [facility] Adult PT Treatment/Exercise - 08/28/18 0001      Exercises   Exercises  Neck;Wrist      Neck Exercises: Theraband   Rows  20 reps;Green    Shoulder External Rotation  Red;15 reps      Neck Exercises: Standing   Other Standing  Exercises  Scap pull outs YTB x20;       Neck Exercises: Seated   Neck Retraction  20 reps      Neck Exercises: Supine   Other Supine Exercise  Horizontal Abd 2lb x20;       Neck Exercises: Prone   Other Prone Exercise  Prone Ts x 15 bil;       Modalities   Modalities  Traction      Traction   Type of Traction  Cervical    Min (lbs)  12    Time  5 min x2= 10 min      Manual Therapy   Manual Therapy  Joint mobilization;Manual Traction;Passive ROM;Soft tissue mobilization    Manual therapy comments  --    Joint Mobilization  PA mobs c-spine gr 3    Soft tissue mobilization  STM/DTM to Bil UTs , cervical paraspinals and sub occipitals.     Passive ROM  Manual upper trap stretching     Manual Traction  --  Neck Exercises: Stretches   Upper Trapezius Stretch  3 reps;30 seconds             PT Education - 08/28/18 0821    Education Details  Reviewed HEP and PT POC    Person(s) Educated  Patient    Methods  Explanation;Demonstration    Comprehension  Verbalized understanding;Returned demonstration;Verbal cues required       PT Short Term Goals - 08/28/18 0823      PT SHORT TERM GOAL #1   Title  Pt to be independent with initial HEP     Time  2    Period  Weeks    Status  Achieved    Target Date  07/12/18      PT SHORT TERM GOAL #2   Title  Pt to report decreased pain in neck to 0-4/10     Time  2    Period  Weeks    Status  Partially Met    Target Date  09/06/18        PT Long Term Goals - 08/28/18 0823      PT LONG TERM GOAL #1   Title  Pt to be independent with final HEP    Time  6    Period  Weeks    Status  On-going    Target Date  09/20/18      PT LONG TERM GOAL #2   Title  Pt to report decreased pain in neck and UE, to 0-2/10 with activity     Time  6    Period  Weeks    Status  On-going    Target Date  09/20/18      PT LONG TERM GOAL #3   Title  Pt to demo increased strength of shoulders and postural muscles to at least 4+/5 to  improve pain and posture.     Time  6    Period  Weeks    Status  Partially Met    Target Date  08/22/18      PT LONG TERM GOAL #4   Title  Pt to demo proper lifting, carrying techniques, to improve pain and posture with work duties.     Time  6    Period  Weeks    Status  Partially Met    Target Date  09/20/18            Plan - 08/28/18 0825    Clinical Impression Statement  Pt has been seen for 5 visits. She is only able to come 1x/wk and was also sick last week. Pt has made improvements in cervical ROM, as well as strenghtening for shoulders and scapular region. She will continue to benefit from progressive strengthening. She continues to have increased and variable pain. Pain reduction has been less than expected. Discussed posture, work duties, and stress in detail today, as contributors. Mechanical traction done today for pain relief. Will assess effects next visit. Pt to benefit from continuation of skilled care, for 4 more weeks. Pt in agreement with plan.     Clinical Presentation  Stable    Clinical Decision Making  Low    PT Frequency  2x / week    PT Duration  4 weeks    PT Treatment/Interventions  ADLs/Self Care Home Management;Cryotherapy;Electrical Stimulation;Iontophoresis 59m/ml Dexamethasone;Functional mobility training;Patient/family education;Ultrasound;Traction;Moist Heat;Therapeutic activities;Therapeutic exercise;Neuromuscular re-education;Passive range of motion;Manual techniques;Dry needling;Taping    Consulted and Agree with Plan of Care  Patient  Patient will benefit from skilled therapeutic intervention in order to improve the following deficits and impairments:  Decreased range of motion, Increased muscle spasms, Decreased endurance, Decreased activity tolerance, Pain, Impaired flexibility, Improper body mechanics, Decreased mobility, Decreased strength  Visit Diagnosis: Cervicalgia     Problem List Patient Active Problem List   Diagnosis  Date Noted  . Cervical radiculopathy 04/20/2018  . Chronic periscapular pain on right side 01/01/2018  . Hematochezia 09/06/2017  . Obesity (BMI 30.0-34.9) 09/17/2015  . Breast mass in female 03/20/2015  . Chronic right-sided low back pain without sciatica 08/01/2014  . Carpal tunnel syndrome on right 08/01/2014  . Palpitations 03/06/2014  . Meniere's disease (cochlear hydrops) 01/25/2013  . Allergic rhinitis 04/10/2012  . Atypical migraine 05/02/2011  . Tobacco use disorder 03/09/2010  . Papanicolaou smear of cervix with positive high risk human papilloma virus (HPV) test 12/06/2007  . GERD 10/16/2007  . Benign essential HTN 02/07/2007  . Mild intermittent asthma 02/07/2007   Lyndee Hensen, PT, DPT 8:32 AM  08/28/18   [facility] [place], [place], [address] Phone: [phone]   Fax:  [phone]  Name: Jessica Hale MRN: [id number] Date of Birth: 07/26/1975

## 2018-08-30 ENCOUNTER — Ambulatory Visit: Payer: 59 | Admitting: Physical Therapy

## 2018-08-30 ENCOUNTER — Encounter: Payer: Self-pay | Admitting: Physical Therapy

## 2018-08-30 DIAGNOSIS — M542 Cervicalgia: Secondary | ICD-10-CM

## 2018-09-03 ENCOUNTER — Encounter: Payer: Self-pay | Admitting: Physical Therapy

## 2018-09-03 NOTE — Therapy (Signed)
[facility] [address] [place], [place], [address] Phone: [phone]   Fax:  [phone]  Physical Therapy Treatment  Patient Details  Name: Jessica Hale MRN: [id number] Date of Birth: 08/01/1975 Referring Provider (PT): Briscoe Deutscher   Encounter Date: 08/30/2018  PT End of Session - 09/03/18 1137    Visit Number  6    Number of Visits  12    Date for PT Re-Evaluation  09/20/18    Authorization Type  UHC    PT Start Time  1303    PT Stop Time  1346    PT Time Calculation (min)  43 min    Activity Tolerance  Patient tolerated treatment well    Behavior During Therapy  [facility] for tasks assessed/performed       Past Medical History:  Diagnosis Date  . Anemia   . Asthma   . GERD (gastroesophageal reflux disease)   . Hypertension   . Migraine headache     Past Surgical History:  Procedure Laterality Date  . KNEE SURGERY     Age 43    There were no vitals filed for this visit.  Subjective Assessment - 09/03/18 1137    Subjective  Pt had increased pain on Mon/Tues, but pain is better today.     Patient Stated Goals  decreased pain    Currently in Pain?  Yes    Pain Score  5     Pain Location  Neck    Pain Orientation  Right;Left    Pain Descriptors / Indicators  Aching;Tightness    Pain Type  Chronic pain    Pain Onset  More than a month ago    Pain Frequency  Intermittent    Multiple Pain Sites  No                       [facility] Adult PT Treatment/Exercise - 09/03/18 0001      Exercises   Exercises  Neck;Wrist      Neck Exercises: Theraband   Rows  20 reps;Green    Shoulder External Rotation  Red;20 reps    Other Theraband Exercises  Scap pull outs RTB x20;       Neck Exercises: Standing   Other Standing Exercises  Scap pull outs YTB x20;       Neck Exercises: Seated   Neck Retraction  20 reps      Neck Exercises: Prone   Other Prone Exercise  Prone I's and T's x 15 bil on PBall;       Modalities   Modalities  Traction      Traction   Type of Traction  Cervical    Min (lbs)  15    Time  5 min x2= 10 min      Manual Therapy   Manual Therapy  Joint mobilization;Manual Traction;Passive ROM;Soft tissue mobilization    Joint Mobilization  PA mobs c-spine gr 3; seated mulligan mobs for increasing R rotation.     Soft tissue mobilization  STM to upper cervical musculature.     Passive ROM  Manual upper trap stretching ; Sub occipital release      Neck Exercises: Stretches   Upper Trapezius Stretch  3 reps;30 seconds               PT Short Term Goals - 08/28/18 0823      PT SHORT TERM GOAL #1   Title  Pt to  be independent with initial HEP     Time  2    Period  Weeks    Status  Achieved    Target Date  07/12/18      PT SHORT TERM GOAL #2   Title  Pt to report decreased pain in neck to 0-4/10     Time  2    Period  Weeks    Status  Partially Met    Target Date  09/06/18        PT Long Term Goals - 08/28/18 0823      PT LONG TERM GOAL #1   Title  Pt to be independent with final HEP    Time  6    Period  Weeks    Status  On-going    Target Date  09/20/18      PT LONG TERM GOAL #2   Title  Pt to report decreased pain in neck and UE, to 0-2/10 with activity     Time  6    Period  Weeks    Status  On-going    Target Date  09/20/18      PT LONG TERM GOAL #3   Title  Pt to demo increased strength of shoulders and postural muscles to at least 4+/5 to improve pain and posture.     Time  6    Period  Weeks    Status  Partially Met    Target Date  08/22/18      PT LONG TERM GOAL #4   Title  Pt to demo proper lifting, carrying techniques, to improve pain and posture with work duties.     Time  6    Period  Weeks    Status  Partially Met    Target Date  09/20/18            Plan - 09/03/18 1141    Clinical Impression Statement  Traction continued today. Pt states positive results from last session. Pt did also obtain home traction  device, will bring next visit for education on use. Pt states increasd pain in scapular region, will assess further next visit. Pt improving with ability for strengthening and progression of ther ex, but continues to have pain. Pt continuously educated on posture as it effects neck pain. Pt to benefit from continued care.     PT Frequency  2x / week    PT Duration  4 weeks    PT Treatment/Interventions  ADLs/Self Care Home Management;Cryotherapy;Electrical Stimulation;Iontophoresis 51m/ml Dexamethasone;Functional mobility training;Patient/family education;Ultrasound;Traction;Moist Heat;Therapeutic activities;Therapeutic exercise;Neuromuscular re-education;Passive range of motion;Manual techniques;Dry needling;Taping    Consulted and Agree with Plan of Care  Patient       Patient will benefit from skilled therapeutic intervention in order to improve the following deficits and impairments:  Decreased range of motion, Increased muscle spasms, Decreased endurance, Decreased activity tolerance, Pain, Impaired flexibility, Improper body mechanics, Decreased mobility, Decreased strength  Visit Diagnosis: Cervicalgia     Problem List Patient Active Problem List   Diagnosis Date Noted  . Cervical radiculopathy 04/20/2018  . Chronic periscapular pain on right side 01/01/2018  . Hematochezia 09/06/2017  . Obesity (BMI 30.0-34.9) 09/17/2015  . Breast mass in female 03/20/2015  . Chronic right-sided low back pain without sciatica 08/01/2014  . Carpal tunnel syndrome on right 08/01/2014  . Palpitations 03/06/2014  . Meniere's disease (cochlear hydrops) 01/25/2013  . Allergic rhinitis 04/10/2012  . Atypical migraine 05/02/2011  . Tobacco use disorder 03/09/2010  . Papanicolaou  smear of cervix with positive high risk human papilloma virus (HPV) test 12/06/2007  . GERD 10/16/2007  . Benign essential HTN 02/07/2007  . Mild intermittent asthma 02/07/2007    Lyndee Hensen, PT, DPT 11:43 AM   09/03/18    [facility] [place], [place], [address] Phone: [phone]   Fax:  [phone]  Name: Jessica Hale MRN: [id number] Date of Birth: [birth year]

## 2018-09-13 ENCOUNTER — Encounter: Payer: 59 | Admitting: Physical Therapy

## 2018-09-16 ENCOUNTER — Other Ambulatory Visit: Payer: Self-pay | Admitting: Family Medicine

## 2018-09-20 ENCOUNTER — Ambulatory Visit: Payer: 59 | Admitting: Physical Therapy

## 2018-09-20 DIAGNOSIS — M542 Cervicalgia: Secondary | ICD-10-CM | POA: Diagnosis not present

## 2018-09-25 ENCOUNTER — Encounter: Payer: Self-pay | Admitting: Family Medicine

## 2018-09-25 ENCOUNTER — Telehealth: Payer: Self-pay | Admitting: Family Medicine

## 2018-09-25 ENCOUNTER — Encounter: Payer: Self-pay | Admitting: Physical Therapy

## 2018-09-25 NOTE — Therapy (Addendum)
[facility] [address] [place], [place], [address] Phone: [phone]   Fax:  [phone]  Physical Therapy Treatment/Re-Cert   Patient Details  Name: Jessica Hale MRN: [id number] Date of Birth: 04-01-1976 Referring Provider (PT): Briscoe Deutscher   Encounter Date: 09/20/2018  PT End of Session - 09/25/18 1249    Visit Number  7    Number of Visits  12    Date for PT Re-Evaluation  10/11/18    Authorization Type  UHC    PT Start Time  9407    PT Stop Time  1559    PT Time Calculation (min)  42 min    Activity Tolerance  Patient tolerated treatment well    Behavior During Therapy  [facility] for tasks assessed/performed       Past Medical History:  Diagnosis Date  . Anemia   . Asthma   . GERD (gastroesophageal reflux disease)   . Hypertension   . Migraine headache     Past Surgical History:  Procedure Laterality Date  . KNEE SURGERY     Age 43    There were no vitals filed for this visit.  Subjective Assessment - 09/25/18 1253    Subjective  Pt states improvements in pain. She has understanding that stress has increased pain from time to time. Grandmother was sick, and was unable to attend PT for a coupe weeks. Pt states improved neck and hand pain. Neck stil feels tight in upper/mid neck musculature. Has been doing HEP and using home traction device . Hand/wrist pain is much improved. States no UE pain     Currently in Pain?  Yes    Pain Score  3     Pain Location  Neck    Pain Orientation  Left;Right    Pain Descriptors / Indicators  Aching;Tightness    Pain Type  Chronic pain    Pain Onset  More than a month ago    Pain Frequency  Intermittent                       [facility] Adult PT Treatment/Exercise - 09/25/18 0001      Exercises   Exercises  Neck;Wrist      Neck Exercises: Theraband   Rows  20 reps;Green    Shoulder External Rotation  Red;20 reps    Shoulder External Rotation Limitations  Bil    Other Theraband Exercises  Scap pull outs RTB x20;       Neck Exercises: Seated   Neck Retraction  20 reps      Modalities   Modalities  Traction      Manual Therapy   Manual Therapy  Joint mobilization;Manual Traction;Passive ROM;Soft tissue mobilization    Manual therapy comments  skilled palpation and monitoring of soft tissue during dry needling.     Joint Mobilization  PA mobs c-spine gr 3;     Soft tissue mobilization  STM to upper and mid cervical musculature, and UTS.      Passive ROM  Manual upper trap stretching ; Sub occipital release      Neck Exercises: Stretches   Upper Trapezius Stretch  3 reps;30 seconds       Trigger Point Dry Needling - 09/25/18 0001    Consent Given?  Yes    Muscles Treated Upper Quadrant  Infraspinatus;Teres minor    Infraspinatus Response  Palpable increased muscle length;Twitch response elicited    Teres minor Response  Palpable increased muscle length           PT Education - 09/25/18 1255    Education Details  Education on PT POC, d/c plan, HEP reviewed.     Person(s) Educated  Patient    Methods  Explanation    Comprehension  Verbalized understanding       PT Short Term Goals - 09/25/18 1250      PT SHORT TERM GOAL #1   Title  Pt to be independent with initial HEP     Time  2    Period  Weeks    Status  Achieved    Target Date  07/12/18      PT SHORT TERM GOAL #2   Title  Pt to report decreased pain in neck to 0-4/10     Time  2    Period  Weeks    Status  Achieved    Target Date  09/06/18        PT Long Term Goals - 09/25/18 1251      PT LONG TERM GOAL #1   Title  Pt to be independent with final HEP    Time  6    Period  Weeks    Status  Partially Met    Target Date  10/11/18      PT LONG TERM GOAL #2   Title  Pt to report decreased pain in neck and UE, to 0-2/10 with activity     Time  6    Period  Weeks    Status  Partially Met    Target Date  10/11/18      PT LONG TERM GOAL #3   Title  Pt to demo  increased strength of shoulders and postural muscles to at least 4+/5 to improve pain and posture.     Time  6    Period  Weeks    Status  Partially Met    Target Date  10/11/18      PT LONG TERM GOAL #4   Title  Pt to demo proper lifting, carrying techniques, to improve pain and posture with work duties.     Time  6    Period  Weeks    Status  Partially Met    Target Date  10/11/18            Plan - 09/25/18 1300    Clinical Impression Statement  Re-cert done today. Pt to benefit from 1-2 additional visits, to finalize HEP, postural strengthening, and educate on use of home traction (pt forgot to bring with her today). Pt with improving cervical ROM, improving abilty to self correct posture, and improving pain. She continues to have tightness and soreness in UT and posterior shoulder region, Good response from dry needling today with this region, will assess next visit. Pt has met some LTGs, will benefit from 1-2 more sessions to fully meet. Plan to d/c at that time, pt in agreement with plan.     PT Frequency  2x / week    PT Duration  4 weeks    PT Treatment/Interventions  ADLs/Self Care Home Management;Cryotherapy;Electrical Stimulation;Iontophoresis 31m/ml Dexamethasone;Functional mobility training;Patient/family education;Ultrasound;Traction;Moist Heat;Therapeutic activities;Therapeutic exercise;Neuromuscular re-education;Passive range of motion;Manual techniques;Dry needling;Taping    Consulted and Agree with Plan of Care  Patient       Patient will benefit from skilled therapeutic intervention in order to improve the following deficits and impairments:  Decreased range of motion, Increased muscle spasms, Decreased endurance, Decreased activity  tolerance, Pain, Impaired flexibility, Improper body mechanics, Decreased mobility, Decreased strength  Visit Diagnosis: Cervicalgia     Problem List Patient Active Problem List   Diagnosis Date Noted  . Cervical radiculopathy  04/20/2018  . Chronic periscapular pain on right side 01/01/2018  . Hematochezia 09/06/2017  . Obesity (BMI 30.0-34.9) 09/17/2015  . Breast mass in female 03/20/2015  . Chronic right-sided low back pain without sciatica 08/01/2014  . Carpal tunnel syndrome on right 08/01/2014  . Palpitations 03/06/2014  . Meniere's disease (cochlear hydrops) 01/25/2013  . Allergic rhinitis 04/10/2012  . Atypical migraine 05/02/2011  . Tobacco use disorder 03/09/2010  . Papanicolaou smear of cervix with positive high risk human papilloma virus (HPV) test 12/06/2007  . GERD 10/16/2007  . Benign essential HTN 02/07/2007  . Mild intermittent asthma 02/07/2007    Lyndee Hensen, PT, DPT 1:02 PM  09/25/18    [facility] [place], [place], [address] Phone: [phone]   Fax:  [phone]  Name: MAURIANNA BENARD MRN: [id number] Date of Birth: [birth year]     PHYSICAL THERAPY DISCHARGE SUMMARY Visits from Start of Care: 7  Plan: Patient agrees to discharge.  Patient goals were partially met. Patient is being discharged due to meeting the stated rehab goals.  ?????    Pt last seen 3/5. Clinic with temporary reduction of hours, due to COVID 19, and pt did not return to PT.   Lyndee Hensen, PT, DPT 8:58 AM  01/01/19

## 2018-09-25 NOTE — Telephone Encounter (Signed)
Copied from CRM [id number]. Topic: General - Other >> Sep 25, 2018  3:51 PM Angela Nevin wrote: Reason for CRM: Patient requesting call back from [facility]- Patient would only disclose that she has sent MyChart message and that it is regarding FMLA.

## 2018-09-27 NOTE — Telephone Encounter (Signed)
Spoke to patient on day of call was able to answer questions she had about fmla she will call if any more issues.

## 2018-09-28 ENCOUNTER — Other Ambulatory Visit: Payer: Self-pay

## 2018-09-28 ENCOUNTER — Ambulatory Visit: Payer: 59 | Admitting: Sports Medicine

## 2018-09-28 ENCOUNTER — Encounter: Payer: Self-pay | Admitting: Sports Medicine

## 2018-09-28 VITALS — BP 122/84 | HR 74 | Ht 62.0 in | Wt 160.2 lb

## 2018-09-28 DIAGNOSIS — G8929 Other chronic pain: Secondary | ICD-10-CM

## 2018-09-28 DIAGNOSIS — M9908 Segmental and somatic dysfunction of rib cage: Secondary | ICD-10-CM

## 2018-09-28 DIAGNOSIS — M25511 Pain in right shoulder: Secondary | ICD-10-CM

## 2018-09-28 DIAGNOSIS — M542 Cervicalgia: Secondary | ICD-10-CM

## 2018-09-28 DIAGNOSIS — M9901 Segmental and somatic dysfunction of cervical region: Secondary | ICD-10-CM

## 2018-09-28 DIAGNOSIS — M9902 Segmental and somatic dysfunction of thoracic region: Secondary | ICD-10-CM

## 2018-09-28 DIAGNOSIS — M5412 Radiculopathy, cervical region: Secondary | ICD-10-CM | POA: Diagnosis not present

## 2018-09-28 DIAGNOSIS — G5602 Carpal tunnel syndrome, left upper limb: Secondary | ICD-10-CM

## 2018-09-28 DIAGNOSIS — G5601 Carpal tunnel syndrome, right upper limb: Secondary | ICD-10-CM

## 2018-09-28 NOTE — Progress Notes (Signed)
Jessica FellsMichael D. Delorise Shinerigby, DO  [facility] [phone]  Jessica Hale - 43 y.o. female MRN [id number]  Date of birth: Jun 16, 1976  Visit Date: September 28, 2018  PCP: Helane RimaWallace, [name], DO   Referred by: Helane RimaWallace, [name], DO  SUBJECTIVE:  Chief Complaint  Patient presents with  . Neck - Follow-up    MRI C-spine 03/31/2018. Has been to PT. Provided with HEP.   . Right Wrist - Follow-up    Wears carpal tunnel splint at night. Has been to PT, provided with HEP. B corticosteroid inj 08/17/2018. Worse with repetitive work. Some numbness and weakness  . Left Wrist - Follow-up    HPI: Patient is here for follow-up after carpal tunnel Hydro dissection and continued home therapeutic exercises.  She is performing the "Foundations Training" with Dr. Myles LippsEric Goodman videos daily.  She reports no back pain at this time which is a big difference.  She has some right sided periscapular discomfort and tightness.  Pain does occasionally radiate into the trapezius.  No pain radiating down the arm.  No pain awakening her at night.  REVIEW OF SYSTEMS: No significant nighttime awakenings due to this issue. Denies fevers, chills, recent weight gain or weight loss.  No night sweats.  Pt denies any change in bowel or bladder habits, muscle weakness, numbness or falls associated with this pain.  HISTORY:  Prior history reviewed and updated per electronic medical record.  Patient Active Problem List   Diagnosis Date Noted  . Cervical radiculopathy 04/20/2018    MRI C-spine 03/31/18 IMPRESSION: 1. Small C5-6 and C6-7 disc protrusions with annular fissures. No fracture or malalignment. 2. Mild canal stenosis C5-6.  No neural foraminal narrowing.   Dr. Alvester MorinNewton, 04/19/18  Assessment & Plan: Visit Diagnoses:  1. Cervical radiculopathy   2. Cervical disc disorder with radiculopathy   3. Myofascial pain syndrome   4. Paresthesia of skin     Plan: Findings:  1.  Chronic  2-year history of neck and parascapular pain on the right and this would fit with radiculitis radiculopathy of a C5 or even C6 distribution.  She has disc protrusion with annular tearing which can cause painful neck pain and parascapular pain at C5-6 and less so at C6-7.  There is no focal nerve compression or focal stenosis of the cervical spine.  Really nothing that I would consider giving the patient hand numbness and tingling which she also has.  On exam today she does have a positive Phalen's test on the right think she may be having some carpal tunnel type syndrome.  She has a lot of physical work with her hands.  She is really failed conservative care at this point.  As for right now I think the best approach is C7-T1 interlaminar injection diagnostically hopefully therapeutically.  This would be done with fluoroscopic guidance.  I also want her to see Dr. [name] for electrodiagnostic study of both upper limbs to look at carpal tunnel entrapment versus radiculopathy.  She will continue to stay on the gabapentin in the ketorolac.  The gabapentin has room to increase but it does make her sleepy.  We talked about using the sadness at night only.  She does have a history of chronic insomnia and it may help her sleep at least at first and eventually this may be something we can increase over time slowly weathers myself or Dr. Earlene PlaterWallace.  Dr. Earlene PlaterWallace is maintaining her on light duty restrictions I  think that is fair at this point.  I do not think the cervical spine lesion is a surgical lesion at this point.    . Chronic periscapular pain on right side 01/01/2018    Intermittent. Works job in [employer] representative.   . Hematochezia 09/06/2017  . Obesity (BMI 30.0-34.9) 09/17/2015  . Breast mass in female 03/20/2015  . Chronic right-sided low back pain without sciatica 08/01/2014  . Carpal tunnel syndrome on right 08/01/2014    B corticosteroid injection 08/17/2018   . Palpitations 03/06/2014  . Meniere's  disease (cochlear hydrops) 01/25/2013  . Allergic rhinitis 04/10/2012  . Atypical migraine 05/02/2011  . Tobacco use disorder 03/09/2010    Current every day smoker, 5-7 cigs daily Had 1 attempt with nicotine gum but relapsed   . Papanicolaou smear of cervix with positive high risk human papilloma virus (HPV) test 12/06/2007    Normal cytology, positive high risk HPV as of 05/2017. Recommend recheck in 1 year. Patient wants to proceed with colposcopy without retest.   . GERD 10/16/2007  . Benign essential HTN 02/07/2007    Qualifier: Diagnosis of  By: [name] MD, [name]     . Mild intermittent asthma 02/07/2007   Social History   Occupational History  . Not on file  Tobacco Use  . Smoking status: Current Every Day Smoker    Packs/day: 0.25    Years: 5.00    Pack years: 1.25    Types: Cigarettes  . Smokeless tobacco: Never Used  Substance and Sexual Activity  . Alcohol use: Yes    Alcohol/week: 0.0 standard drinks    Comment: occasional  . Drug use: Not Currently    Types: Marijuana  . Sexual activity: Yes    Partners: Female    Comment: Lesbian   Social History   Social History Narrative  . Not on file     OBJECTIVE:  VS:  HT:5\' 2"  (157.5 cm)   WT:160 lb 3.2 oz (72.7 kg)  BMI:29.29    BP:122/84  HR:74bpm  TEMP: ( )  RESP:100 %   PHYSICAL EXAM: Adult female. No acute distress.  Alert and appropriate. She has good overhead range of motion of her bilateral shoulders.  Cervical sidebending and rotation is restricted to the right.  Her upper extremity strength is 5 5.  No significant hand atrophy.   ASSESSMENT:   1. Cervicalgia   2. Carpal tunnel syndrome of left wrist   3. Cervical radiculopathy   4. Chronic periscapular pain on right side   5. Carpal tunnel syndrome on right   6. Somatic dysfunction of cervical region   7. Somatic dysfunction of thoracic region   8. Somatic dysfunction of rib cage region      PROCEDURES:  PROCEDURE NOTE:  OSTEOPATHIC MANIPULATION   The decision today to treat with Osteopathic Manipulative Therapy (OMT) was based on physical exam findings. Verbal consent was obtained following a discussion with the patient regarding the of risks, benefits and potential side effects, including an acute pain flare,post manipulation soreness and need for repeat treatments. Additionally, we specifically discussed the minimal risk of  injury to neurovascular structures associated with Cervical manipulation.NONE  Manipulation was performed as below:  [facility] Treated & Osteopathic Exam Findings   CERVICAL SPINE:   OA - rotated right C3 - 5Extended, rotated RIGHT, sidebent RIGHT THORACIC SPINE:   T4 - 6 Neutral, rotated RIGHT, sidebent LEFT RIBS:   Rib 1Right  Exhalation dysfunction (elevated/inhaled)    OMT Techniques Used:  HVLA muscle energy myofascial release    The patient tolerated the treatment well and reported Improved symptoms following treatment today. Patient was given medications, exercises, stretches and lifestyle modifications per AVS and verbally.       PLAN:  Pertinent additional documentation may be included in corresponding procedure notes, imaging studies, problem based documentation and patient instructions.  No problem-specific Assessment & Plan notes found for this encounter.  Functionally markedly improved.  Essentially no symptoms with her carpal tunnel syndrome or low back pain.  Still some functional cervical spine pain.  Continue previously prescribed home exercise program.   Osteopathic manipulation was performed today based on physical exam findings.  Patient has responded well to osteopathic manipulation previously the prior manipulation did not provide permanent long lasting relief.  The patient does feel as though there was significant benefit to the prior manipulation and they wished for repeat manipulation today.  They understand that home therapeutic exercises are critical  part of the healing/treatment process and will continue with self treatment between now and their next visit as outlined.  The patient understands that the frequency of visits is meant to provide a stimulus to promote the body's own ability to heal and is not meant to be the sole means for improvement in their symptoms.  Activity modifications and the importance of avoiding exacerbating activities (limiting pain to no more than a 4 / 10 during or following activity) recommended and discussed.   Discussed red flag symptoms that warrant earlier emergent evaluation and patient voices understanding.    No orders of the defined types were placed in this encounter. Lab Orders  No laboratory test(s) ordered today   Imaging Orders  No imaging studies ordered today   Referral Orders  No referral(s) requested today    At follow up will plan to consider : repeat osteopathic manipulation  No follow-ups on file.          Andrena Mews, DO    [facility]

## 2018-09-30 ENCOUNTER — Encounter: Payer: Self-pay | Admitting: Family Medicine

## 2018-10-04 ENCOUNTER — Encounter: Payer: 59 | Admitting: Physical Therapy

## 2018-12-23 ENCOUNTER — Other Ambulatory Visit: Payer: Self-pay | Admitting: Family Medicine

## 2018-12-23 DIAGNOSIS — F5101 Primary insomnia: Secondary | ICD-10-CM

## 2018-12-24 NOTE — Telephone Encounter (Signed)
Forwarding to Dr. Wallace.  

## 2018-12-24 NOTE — Telephone Encounter (Signed)
Last OV 08/17/18 Last refill 08/20/18 #30/3 Next OV not scheduled

## 2019-01-07 ENCOUNTER — Encounter: Payer: Self-pay | Admitting: Family Medicine

## 2019-01-07 NOTE — Telephone Encounter (Signed)
Called patient she did not need therapy information at this time. She was able to set something up with work  And has an appointment tomorrow. Will call me tomorrow and let me know how she is doing.

## 2019-01-08 NOTE — Telephone Encounter (Signed)
Pt is asking for call back to get grief counseling set up.  Please call [phone]

## 2019-01-08 NOTE — Telephone Encounter (Signed)
See note

## 2019-01-11 ENCOUNTER — Other Ambulatory Visit: Payer: Self-pay

## 2019-01-11 ENCOUNTER — Ambulatory Visit (INDEPENDENT_AMBULATORY_CARE_PROVIDER_SITE_OTHER): Payer: 59 | Admitting: Family Medicine

## 2019-01-11 ENCOUNTER — Encounter: Payer: Self-pay | Admitting: Family Medicine

## 2019-01-11 ENCOUNTER — Ambulatory Visit: Payer: 59 | Admitting: Family Medicine

## 2019-01-11 VITALS — Ht 62.0 in | Wt 150.0 lb

## 2019-01-11 DIAGNOSIS — F4321 Adjustment disorder with depressed mood: Secondary | ICD-10-CM

## 2019-01-11 MED ORDER — LORAZEPAM 0.5 MG PO TABS: 0.5000 mg | ORAL_TABLET | Freq: Two times a day (BID) | ORAL | 0 refills | Status: DC | PRN

## 2019-01-11 NOTE — Progress Notes (Signed)
Virtual Visit via Video   Due to the COVID-19 pandemic, this visit was completed with telemedicine (audio/video) technology to reduce patient and provider exposure as well as to preserve personal protective equipment.   I connected with Laddie Aquasatasha M Calabretta by a video enabled telemedicine application and verified that I am speaking with the correct person using two identifiers. Location patient: Home Location provider: [facility], Office Persons participating in the virtual visit: Selinda Eonatasha M Aughenbaugh, Helane RimaErica Greg Cratty, DO [name], CMA acting as scribe for Dr. Helane RimaErica Suvan Stcyr.   I discussed the limitations of evaluation and management by telemedicine and the availability of in person appointments. The patient expressed understanding and agreed to proceed.  Care Team   Patient Care Team: Helane RimaWallace, Maycel Riffe, DO as PCP - General (Family Medicine)  Subjective:   HPI: Patient has had increased depression and anxiety after passing of grandmother, the woman that raised her. She is getting counseling at Hospice home. She does take Ambien at night and it helps a little. Finds herself "staring into space" at work. Lots of visitor at home and feels that cousins are leaning on her. Introvert nature. No SI. Smoking more. No ETOH.  Review of Systems  Constitutional: Negative for chills and fever.  HENT: Negative for hearing loss and tinnitus.   Eyes: Negative for blurred vision and double vision.  Respiratory: Negative for cough and wheezing.   Cardiovascular: Negative for chest pain, palpitations and leg swelling.  Gastrointestinal: Negative for nausea and vomiting.  Genitourinary: Negative for dysuria, frequency and urgency.  Musculoskeletal: Negative for myalgias.  Neurological: Negative for tingling and headaches.  Endo/Heme/Allergies: Does not bruise/bleed easily.  Psychiatric/Behavioral: Negative for depression and suicidal ideas.    Patient Active Problem List   Diagnosis Date Noted  . Cervical  radiculopathy 04/20/2018  . Chronic periscapular pain on right side 01/01/2018  . Hematochezia 09/06/2017  . Obesity (BMI 30.0-34.9) 09/17/2015  . Breast mass in female 03/20/2015  . Chronic right-sided low back pain without sciatica 08/01/2014  . Carpal tunnel syndrome on right 08/01/2014  . Palpitations 03/06/2014  . Meniere's disease (cochlear hydrops) 01/25/2013  . Allergic rhinitis 04/10/2012  . Atypical migraine 05/02/2011  . Tobacco use disorder 03/09/2010  . Papanicolaou smear of cervix with positive high risk human papilloma virus (HPV) test 12/06/2007  . GERD 10/16/2007  . Benign essential HTN 02/07/2007  . Mild intermittent asthma 02/07/2007    Social History   Tobacco Use  . Smoking status: Current Every Day Smoker    Packs/day: 0.25    Years: 5.00    Pack years: 1.25    Types: Cigarettes  . Smokeless tobacco: Never Used  Substance Use Topics  . Alcohol use: Yes    Alcohol/week: 0.0 standard drinks    Comment: occasional   Current Outpatient Medications:  .  albuterol (VENTOLIN HFA) 108 (90 Base) MCG/ACT inhaler, Inhale 2 puffs into the lungs every 6 (six) hours as needed for wheezing or shortness of breath., Disp: 1 Inhaler, Rfl: 1 .  beclomethasone (QVAR REDIHALER) 40 MCG/ACT inhaler, Inhale 2 puffs into the lungs 2 (two) times daily., Disp: 2 Inhaler, Rfl: 6 .  EPIPEN 2-PAK 0.3 MG/0.3ML SOAJ injection, inject as directed WITH BEE STING, Disp: , Rfl: 0 .  hydrochlorothiazide (HYDRODIURIL) 25 MG tablet, Take 0.5 tablets (12.5 mg total) by mouth daily., Disp: 30 tablet, Rfl: 11 .  metoprolol tartrate (LOPRESSOR) 25 MG tablet, Take 0.5 tablets (12.5 mg total) by mouth 2 (two) times daily., Disp: 60 tablet,  Rfl: 11 .  omeprazole (PRILOSEC) 40 MG capsule, TAKE 1 CAPSULE(40 MG) BY MOUTH DAILY, Disp: 30 capsule, Rfl: 3 .  zolpidem (AMBIEN) 5 MG tablet, TAKE 1 TABLET(5 MG) BY MOUTH AT BEDTIME AS NEEDED FOR SLEEP, Disp: 30 tablet, Rfl: 1  Allergies  Allergen Reactions    . Bee Venom Anaphylaxis   Objective:   VITALS: Per patient if applicable, see vitals. GENERAL: Alert, appears well and in no acute distress. HEENT: Atraumatic, conjunctiva clear, no obvious abnormalities on inspection of external nose and ears. NECK: Normal movements of the head and neck. CARDIOPULMONARY: No increased WOB. Speaking in clear sentences. I:E ratio WNL.  MS: Moves all visible extremities without noticeable abnormality. PSYCH: Pleasant and cooperative, well-groomed. Speech normal rate and rhythm. Affect is appropriate. Insight and judgement are appropriate. Attention is focused, linear, and appropriate.  NEURO: CN grossly intact. Oriented as arrived to appointment on time with no prompting. Moves both UE equally.  SKIN: No obvious lesions, wounds, erythema, or cyanosis noted on face or hands.  Depression screen [facility] 2/9 01/11/2019 08/17/2018 12/29/2017  Decreased Interest 3 0 0  Down, Depressed, Hopeless 3 0 0  PHQ - 2 Score 6 0 0  Altered sleeping 3 2 -  Tired, decreased energy 3 1 -  Change in appetite 3 0 -  Feeling bad or failure about yourself  3 0 -  Trouble concentrating 3 0 -  Moving slowly or fidgety/restless 0 1 -  Suicidal thoughts 0 0 -  PHQ-9 Score 21 4 -  Difficult doing work/chores Extremely dIfficult Not difficult at all -  Some recent data might be hidden   Assessment and Plan:   Jack was seen today for follow-up.  Diagnoses and all orders for this visit:  Grief -     LORazepam (ATIVAN) 0.5 MG tablet; Take 1 tablet (0.5 mg total) by mouth 2 (two) times daily as needed for anxiety.   Marland Kitchen COVID-19 Education: The signs and symptoms of COVID-19 were discussed with the patient and how to seek care for testing if needed. The importance of social distancing was discussed today. . Reviewed expectations re: course of current medical issues. . Discussed self-management of symptoms. . Outlined signs and symptoms indicating need for more acute  intervention. . Patient verbalized understanding and all questions were answered. Marland Kitchen Health Maintenance issues including appropriate healthy diet, exercise, and smoking avoidance were discussed with patient. . See orders for this visit as documented in the electronic medical record.  Briscoe Deutscher, DO  Records requested if needed. Time spent: 25 minutes, of which >50% was spent in obtaining information about her symptoms, reviewing her previous labs, evaluations, and treatments, counseling her about her condition (please see the discussed topics above), and developing a plan to further investigate it; she had a number of questions which I addressed.

## 2019-01-13 ENCOUNTER — Encounter: Payer: Self-pay | Admitting: Family Medicine

## 2019-01-14 ENCOUNTER — Other Ambulatory Visit: Payer: Self-pay | Admitting: Family Medicine

## 2019-01-14 DIAGNOSIS — I10 Essential (primary) hypertension: Secondary | ICD-10-CM

## 2019-01-14 NOTE — Telephone Encounter (Signed)
Dr. Juleen China verbally informed.

## 2019-01-14 NOTE — Telephone Encounter (Signed)
Please see message and advise.  Thank you. ° °

## 2019-01-15 ENCOUNTER — Encounter: Payer: Self-pay | Admitting: Family Medicine

## 2019-01-15 MED ORDER — CHANTIX STARTING MONTH PAK 0.5 MG X 11 & 1 MG X 42 PO TABS: ORAL_TABLET | ORAL | 0 refills | Status: DC

## 2019-01-15 NOTE — Telephone Encounter (Signed)
Letter faxed- patient informed

## 2019-02-14 ENCOUNTER — Telehealth: Payer: Self-pay | Admitting: *Deleted

## 2019-02-14 NOTE — Telephone Encounter (Signed)
Notification or Prior Authorization is not required for the requested services  This [employer] plan does not currently require a prior authorization for [id number]  Decision ID #:[id number]

## 2019-02-20 ENCOUNTER — Encounter: Payer: Self-pay | Admitting: Family Medicine

## 2019-02-20 NOTE — Telephone Encounter (Signed)
Last fill 01/15/19  #52/0

## 2019-02-21 ENCOUNTER — Ambulatory Visit (INDEPENDENT_AMBULATORY_CARE_PROVIDER_SITE_OTHER): Payer: 59 | Admitting: Family Medicine

## 2019-02-21 ENCOUNTER — Other Ambulatory Visit: Payer: Self-pay

## 2019-02-21 ENCOUNTER — Encounter: Payer: Self-pay | Admitting: Family Medicine

## 2019-02-21 DIAGNOSIS — M5412 Radiculopathy, cervical region: Secondary | ICD-10-CM

## 2019-02-21 DIAGNOSIS — M999 Biomechanical lesion, unspecified: Secondary | ICD-10-CM | POA: Diagnosis not present

## 2019-02-21 MED ORDER — CHANTIX STARTING MONTH PAK 0.5 MG X 11 & 1 MG X 42 PO TABS: ORAL_TABLET | ORAL | 0 refills | Status: DC

## 2019-02-21 NOTE — Assessment & Plan Note (Signed)
Discussed HEP  ROM, Posture scapula stability  RTC in 4-8 weeks

## 2019-02-21 NOTE — Progress Notes (Signed)
[facility] [name], [place] [address] Phone: [phone] Subjective:   [name], am serving as a scribe for Dr. Hulan Saas.  I'm seeing this patient by the request  of:    CC: neck and back pain   [id number]  Jessica Hale is a 43 y.o. female coming in with complaint of neck and back pain. Has done physical therapy for her back. 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 Did have relief with adjustment from Dr. [name]. Feels like muscles are spasming for past 2 weeks. Chronic, intermittent pain. Uses IBU as needed. Does perform HEP that was given to her in PT. Patient is having radiating symptoms into the back of right leg last week for 3 days. Radiating symptoms are gone this week. Has tried gabapentin and naproxen and did not care for side effects.    Patient did have an MRI done in September 2019 found to have a very small disc protrusion from C5-C7 with mild canal stenosis    Past Medical History:  Diagnosis Date  . Anemia   . Asthma   . GERD (gastroesophageal reflux disease)   . Hypertension   . Migraine headache    Past Surgical History:  Procedure Laterality Date  . KNEE SURGERY     Age 41   Social History   Socioeconomic History  . Marital status: Married    Spouse name: Not on file  . Number of children: Not on file  . Years of education: Not on file  . Highest education level: Not on file  Occupational History  . Not on file  Social Needs  . Financial resource strain: Not on file  . Food insecurity    Worry: Not on file    Inability: Not on file  . Transportation needs    Medical: Not on file    Non-medical: Not on file  Tobacco Use  . Smoking status: Current Every Day Smoker    Packs/day: 0.25    Years: 5.00    Pack years: 1.25    Types: Cigarettes  . Smokeless tobacco: Never Used  Substance and Sexual Activity  . Alcohol use: Yes    Alcohol/week: 0.0 standard drinks    Comment:  occasional  . Drug use: Not Currently    Types: Marijuana  . Sexual activity: Yes    Partners: Female    Comment: Lesbian  Lifestyle  . Physical activity    Days per week: 4 days    Minutes per session: Not on file  . Stress: Not on file  Relationships  . Social Herbalist on phone: Not on file    Gets together: Not on file    Attends religious service: Not on file    Active member of club or organization: Not on file    Attends meetings of clubs or organizations: Not on file    Relationship status: Not on file  Other Topics Concern  . Not on file  Social History Narrative  . Not on file   Allergies  Allergen Reactions  . Bee Venom Anaphylaxis   Family History  Problem Relation Age of Onset  . Heart attack Maternal Grandmother   . Hypertension Maternal Grandmother   . Lung cancer Maternal Aunt   . Colon cancer Neg Hx      Current Outpatient Medications (Cardiovascular):  Marland Kitchen  EPIPEN 2-PAK 0.3 MG/0.3ML SOAJ injection, inject as directed WITH BEE STING .  hydrochlorothiazide (HYDRODIURIL) 25 MG tablet, TAKE 1/2 TABLET(12.5 MG) BY MOUTH DAILY .  metoprolol tartrate (LOPRESSOR) 25 MG tablet, TAKE 1/2 TABLET(12.5 MG) BY MOUTH TWICE DAILY  Current Outpatient Medications (Respiratory):  .  albuterol (VENTOLIN HFA) 108 (90 Base) MCG/ACT inhaler, Inhale 2 puffs into the lungs every 6 (six) hours as needed for wheezing or shortness of breath. .  beclomethasone (QVAR REDIHALER) 40 MCG/ACT inhaler, Inhale 2 puffs into the lungs 2 (two) times daily.    Current Outpatient Medications (Other):  Marland Kitchen.  LORazepam (ATIVAN) 0.5 MG tablet, Take 1 tablet (0.5 mg total) by mouth 2 (two) times daily as needed for anxiety. Marland Kitchen.  omeprazole (PRILOSEC) 40 MG capsule, TAKE 1 CAPSULE(40 MG) BY MOUTH DAILY .  varenicline (CHANTIX STARTING MONTH PAK) 0.5 MG X 11 & 1 MG X 42 tablet, Take one 0.5 mg tablet by mouth once daily for 3 days, then increase to one 0.5 mg tablet twice daily for 4 days,  then increase to one 1 mg tablet twice daily. Marland Kitchen.  zolpidem (AMBIEN) 5 MG tablet, TAKE 1 TABLET(5 MG) BY MOUTH AT BEDTIME AS NEEDED FOR SLEEP    Past medical history, social, surgical and family history all reviewed in electronic medical record.  No pertanent information unless stated regarding to the chief complaint.   Review of Systems:  No headache, visual changes, nausea, vomiting, diarrhea, constipation, dizziness, abdominal pain, skin rash, fevers, chills, night sweats, weight loss, swollen lymph nodes, body aches, joint swelling, muscle aches, chest pain, shortness of breath, mood changes.   Objective  Blood pressure 124/88, pulse 81, height 5\' 2"  (1.575 m), SpO2 99 %.    General: No apparent distress alert and oriented x3 mood and affect normal, dressed appropriately.  HEENT: Pupils equal, extraocular movements intact  Respiratory: Patient's speak in full sentences and does not appear short of breath  Cardiovascular: No lower extremity edema, non tender, no erythema  Skin: Warm dry intact with no signs of infection or rash on extremities or on axial skeleton.  Abdomen: Soft nontender  Neuro: Cranial nerves II through XII are intact, neurovascularly intact in all extremities with 2+ DTRs and 2+ pulses.  Lymph: No lymphadenopathy of posterior or anterior cervical chain or axillae bilaterally.  Gait normal with good balance and coordination.  MSK:  Non tender with full range of motion and good stability and symmetric strength and tone of shoulders, elbows, wrist, hip, knee and ankles bilaterally.  Neck exam does have some mild loss of lordosis.  Negative Spurling's noted today done.  Patient does have mild tenderness to palpation of paraspinal musculature lumbar spine.  5 out of 5 strength in the upper extremities.    Osteopathic findings  C4 flexed rotated and side bent left C6 flexed rotated and side bent left T3 extended rotated and side bent right inhaled third rib     Impression and Recommendations:     This case required medical decision making of moderate complexity. The above documentation has been reviewed and is accurate and complete [name], DO       Note: This dictation was prepared with Dragon dictation along with smaller phrase technology. Any transcriptional errors that result from this process are unintentional.

## 2019-02-21 NOTE — Assessment & Plan Note (Signed)
Decision today to treat with OMT was based on Physical Exam  After verbal consent patient was treated with HVLA, ME, FPR techniques in cervical, thoracic, rib areas  Patient tolerated the procedure well with improvement in symptoms  Patient given exercises, stretches and lifestyle modifications  See medications in patient instructions if given  Patient will follow up in 4-8 weeks 

## 2019-02-21 NOTE — Patient Instructions (Signed)
Good to see you  Jessica Hale is your friend [name] in the water little bit may help  Vitamin D 2000 IU daily  Turmeric 500mg  daily  See me again in 6ish weeks

## 2019-02-25 ENCOUNTER — Other Ambulatory Visit: Payer: Self-pay | Admitting: Family Medicine

## 2019-02-25 DIAGNOSIS — F5101 Primary insomnia: Secondary | ICD-10-CM

## 2019-02-25 NOTE — Telephone Encounter (Signed)
Last OV: 01/11/19 Next OV: none scheduled at this time PMP website checked: last filled on 01/23/19

## 2019-03-05 ENCOUNTER — Telehealth: Payer: Self-pay | Admitting: Physical Medicine and Rehabilitation

## 2019-03-05 ENCOUNTER — Ambulatory Visit: Payer: Self-pay

## 2019-03-05 ENCOUNTER — Encounter: Payer: Self-pay | Admitting: Physical Medicine and Rehabilitation

## 2019-03-05 ENCOUNTER — Ambulatory Visit (INDEPENDENT_AMBULATORY_CARE_PROVIDER_SITE_OTHER): Payer: 59 | Admitting: Physical Medicine and Rehabilitation

## 2019-03-05 ENCOUNTER — Other Ambulatory Visit: Payer: Self-pay | Admitting: Physical Medicine and Rehabilitation

## 2019-03-05 VITALS — BP 132/99 | HR 63

## 2019-03-05 DIAGNOSIS — M5416 Radiculopathy, lumbar region: Secondary | ICD-10-CM

## 2019-03-05 DIAGNOSIS — R202 Paresthesia of skin: Secondary | ICD-10-CM | POA: Diagnosis not present

## 2019-03-05 DIAGNOSIS — M5412 Radiculopathy, cervical region: Secondary | ICD-10-CM | POA: Diagnosis not present

## 2019-03-05 DIAGNOSIS — M5010 Cervical disc disorder with radiculopathy, unspecified cervical region: Secondary | ICD-10-CM | POA: Diagnosis not present

## 2019-03-05 DIAGNOSIS — F411 Generalized anxiety disorder: Secondary | ICD-10-CM

## 2019-03-05 MED ORDER — DIAZEPAM 5 MG PO TABS: ORAL_TABLET | ORAL | 0 refills | Status: DC

## 2019-03-05 MED ORDER — METHYLPREDNISOLONE ACETATE 80 MG/ML IJ SUSP
80.0000 mg | Freq: Once | INTRAMUSCULAR | Status: AC
  Administered 2019-03-05: 17:00:00 80 mg

## 2019-03-05 NOTE — Telephone Encounter (Signed)
done

## 2019-03-05 NOTE — Telephone Encounter (Signed)
Patient notified via My Chart

## 2019-03-05 NOTE — Progress Notes (Signed)
 .  Numeric Pain Rating Scale and Functional Assessment Average Pain 7.5 Pain Right Now 7 My pain is constant, sharp, tingling and aching Pain is worse with: walking, standing and some activites Pain improves with: heat/ice and medication   In the last MONTH (on 0-10 scale) has pain interfered with the following?  1. General activity like being  able to carry out your everyday physical activities such as walking, climbing stairs, carrying groceries, or moving a chair?  Rating(8)  2. Relation with others like being able to carry out your usual social activities and roles such as  activities at home, at work and in your community. Rating(8)  3. Enjoyment of life such that you have  been bothered by emotional problems such as feeling anxious, depressed or irritable?  Rating(6)

## 2019-03-05 NOTE — Progress Notes (Signed)
Pre-procedure diazepam ordered for pre-operative anxiety.  

## 2019-03-06 NOTE — Progress Notes (Signed)
Jessica Hale - 43 y.o. female MRN [id number]  Date of birth: 03/11/1976  Office Visit Note: Visit Date: 03/05/2019 PCP: Helane RimaWallace, [name], DO Referred by: Helane RimaWallace, [name], DO  Subjective: Chief Complaint  Patient presents with   Lower Back - Pain   Neck - Pain   Right Shoulder - Pain   Right Arm - Pain, Numbness, Tingling   Right Leg - Pain   HPI: Jessica Hale is a 43 y.o. female who comes in today For two specific issues in terms of pain complaints.  She has had prior cervical injection with the first injection giving her a fair amount of relief and the second injection not lasting very long.  She comes in today again with neck pain and cervicalgia with radiating symptoms into the right arm and shoulder blade with numbness and paresthesia.  She comes in today for planned repeat injection for that problem.  New problem to us anyway is chronic worsening low back pain with some pain in the right leg with referral pattern.  She was originally sent to us by Dr. Helane RimaErica Wallace as well as Dr. Gaspar BiddingMichael Rigby.  Since Dr. Gaspar BiddingMichael Rigby has left their practice she has been seeing Dr. Antoine PrimasZachary Smith from a sports medicine standpoint.  There was a lot of confusion today with her in terms of her condition.  She says that she had been told that her neck problem is causing all the other problems including her low back pain.  She has had multiple bouts of physical therapy with dry needling both of the cervical area and lumbar.  She has had osteopathic manipulation by both Dr. Berline Choughigby and Dr. Katrinka BlazingSmith.  She has had medication trial.  When I first saw her we obtained electrodiagnostic studies to Dr. [name] of her right upper limb showing very mild median neuropathy.  Some evidence of mild ulnar neuropathy on the left.  Patient has a pretty physical job at times.  Cervical spine shows very small protrusions without any cord encroachment and really without any nerve encroachment.  Patient has a lot of myofascial  pain as well.  No specific diagnosis of underlying fibromyalgia.  Does have some atypical pain diagnoses such as atypical migraine and does have a smoking history.  Today she also complains of chronic low back pain worse on the right.  Worse with sitting more than standing.  Pain refers down the posterior buttock hamstring and posterior calf and a pretty classic S1 distribution or obviously potentially sciatic nerve distribution.  She reports for both her neck and lower back she uses ice ibuprofen and tub baths with temporary relief.  She reports her pain in the leg is pretty sharp and aching when it is there.  Her neck pain is constant sharp and tingling.  She denies any left-sided complaints.  She has had no focal weakness or bowel or bladder difficulties.  It does affect her daily living and she does report worsening pain with working.  Review of Systems  Constitutional: Negative for chills, fever, malaise/fatigue and weight loss.  HENT: Negative for hearing loss and sinus pain.   Eyes: Negative for blurred vision, double vision and photophobia.  Respiratory: Negative for cough and shortness of breath.   Cardiovascular: Negative for chest pain, palpitations and leg swelling.  Gastrointestinal: Negative for abdominal pain, nausea and vomiting.  Genitourinary: Negative for flank pain.  Musculoskeletal: Positive for back pain, joint pain and neck pain. Negative for myalgias.  Skin: Negative for itching and  rash.  Neurological: Positive for tingling. Negative for tremors, focal weakness and weakness.  Endo/Heme/Allergies: Negative.   Psychiatric/Behavioral: Negative for depression.  All other systems reviewed and are negative.  Otherwise per HPI.  Assessment & Plan: Visit Diagnoses:  1. Cervical radiculopathy   2. Cervical disc disorder with radiculopathy   3. Radiculopathy, lumbar region   4. Paresthesia of skin     Plan: Findings:  1.  Chronic worsening neck pain with presumably right  radicular arm pain.  Electrodiagnostic study did not show any radiculopathy but did show mild carpal tunnel syndrome and median neuropathy.  This is been treated by Dr. Antoine Primas.  She continues to have neck and shoulder blade pain.  Small disc protrusions at 2 levels without focal nerve compression.  Initial epidural injection was pretty beneficial second epidural injection done a few months ago gave her a few weeks of relief of the symptoms started to return.  I would not discount this at this point I will go ahead and repeat the epidural injection which we can do today I had planned on that.  If she only gets a little bit of relief from that I would suggest further conservative care with physical therapy and dry needling and potentially referral to chronic pain management for medication trials depending on level of comfort from primary care physicians.  Could also consider consultation with Dr. Vira Browns in our office from spine surgery standpoint but there is no focal compression on MRI.  2.  In terms of her low back this is been treated for quite some time through her primary care physician and sports medicine physician.  We had not really been asked to look at that as much but today we did evaluate her.  It appears that is probably disc related may be protrusion or herniation at L5-S1.  She has equivocally positive slump test on the right with tight hamstring and classic S1 distribution pain.  Worse with sitting.  She is not had any imaging of the lumbar spine performed recently.  She has had treatment though with physical therapy and failed that as well as medications.  I think given her prior disc protrusions and situation it would be wise to look at MRI of the lumbar spine to guide [place] in any further treatment.  Obviously if it is this protrusion that is consistent with that S1 transforaminal injection or something similar could be performed.  It may give the physical therapist and sports medicine  physician another avenue to look at for her back.  Conversely if it is a fairly severe finding which I doubt then we could look again at referral to Dr. Vira Browns.    Meds & Orders:  Meds ordered this encounter  Medications   methylPREDNISolone acetate (DEPO-MEDROL) injection 80 mg    Orders Placed This Encounter  Procedures   XR C-ARM NO REPORT   Epidural Steroid injection    Follow-up: Return for MRI review after completion.   Procedures: No procedures performed  Cervical Epidural Steroid Injection - Interlaminar Approach with Fluoroscopic Guidance  [name]      Date of Birth: 05-01-1976 MRN: [id number] PCP: Helane Rima, DO      Visit Date: 03/05/2019   Universal Protocol:    Date/Time: 08/19/206:28 AM  Consent Given By: the patient  Position: PRONE  Additional Comments: Vital signs were monitored before and after the procedure. Patient was prepped and draped in the usual sterile fashion. The correct patient, procedure,  and site was verified.   Injection Procedure Details:  Procedure Site One Meds Administered:  Meds ordered this encounter  Medications   methylPREDNISolone acetate (DEPO-MEDROL) injection 80 mg     Laterality: Right  Location/Site: C7-T1  Needle size: 20 G  Needle type: Touhy  Needle Placement: Paramedian epidural space  Findings:  -Comments: Excellent flow of contrast into the epidural space.  Procedure Details: Using a paramedian approach from the side mentioned above, the region overlying the inferior lamina was localized under fluoroscopic visualization and the soft tissues overlying this structure were infiltrated with 4 ml. of 1% Lidocaine without Epinephrine. A # 20 gauge, Tuohy needle was inserted into the epidural space using a paramedian approach.  The epidural space was localized using loss of resistance along with lateral and contralateral oblique bi-planar fluoroscopic views.  After negative aspirate  for air, blood, and CSF, a 2 ml. volume of Isovue-250 was injected into the epidural space and the flow of contrast was observed. Radiographs were obtained for documentation purposes.   The injectate was administered into the level noted above.  Additional Comments:  The patient tolerated the procedure well Dressing: 2 x 2 sterile gauze and Band-Aid    Post-procedure details: Patient was observed during the procedure. Post-procedure instructions were reviewed.  Patient left the clinic in stable condition.    Clinical History: NCV & EMG Findings: 05/10/2018  Extensive electrodiagnostic testing of the right upper extremity and additional studies of the left shows:  1. Right mixed palmar sensory responses show prolonged latency.  Bilateral median, bilateral ulnar, and left mixed palmar sensory responses are within normal limits. 2. Bilateral median and right ulnar motor responses are within normal limits.  Left ulnar motor response shows decreased conduction velocity across the elbow (A Elbow-B Elbow, 45 m/s).   3. There is no evidence of active or chronic motor axonal loss changes affecting any of the tested muscles.  Motor unit configuration and recruitment pattern is within normal limits.  Impression: 1. Right median neuropathy at or distal to the wrist, consistent with a clinical diagnosis of carpal tunnel syndrome.  Overall, these findings are very mild in degree electrically. 2. Left ulnar neuropathy with slowing across the elbow, purely demyelinating and type and mild in degree electrically.   ___________________________ [name], DO  MRI CERVICAL SPINE WITHOUT CONTRAST  TECHNIQUE: Multiplanar, multisequence MR imaging of the cervical spine was performed. No intravenous contrast was administered.  COMPARISON: None.  FINDINGS: ALIGNMENT: Straightened cervical lordosis. No malalignment.  VERTEBRAE/DISCS: Vertebral bodies are intact. Intervertebral  disc morphology's and signal are normal. Mild chronic discogenic endplate changes C5-6. No abnormal or acute bone marrow signal.  CORD:Cervical spinal cord is normal morphology and signal characteristics from the cervicomedullary junction to level of T2-3, the most caudal well visualized level.  POSTERIOR FOSSA, VERTEBRAL ARTERIES, PARASPINAL TISSUES: No MR findings of ligamentous injury. Vertebral artery flow voids present. Included posterior fossa and paraspinal soft tissues are normal.  DISC LEVELS:  C2-3 through C4-5: No disc bulge, canal stenosis nor neural foraminal narrowing.  C5-6: Small central disc protrusion and annular fissure contacting the ventral spinal cord, mild canal stenosis. No neural foraminal narrowing.  C6-7: 1-2 mm central disc protrusion with annular fissure. No canal stenosis or neural foraminal narrowing.  C7-T1: No disc bulge, canal stenosis nor neural foraminal narrowing.  IMPRESSION: 1. Small C5-6 and C6-7 disc protrusions with annular fissures. No fracture or malalignment. 2. Mild canal stenosis C5-6. No neural foraminal narrowing.   Electronically  Signed By: Elon Alas M.D. On: 03/31/2018 23:54   She reports that she has been smoking cigarettes. She has a 1.25 pack-year smoking history. She has never used smokeless tobacco.  Recent Labs    04/20/18 1013  HGBA1C 5.5    Objective:  VS:  HT:     WT:    BMI:      BP:(!) 132/99   HR:63bpm   TEMP: ( )   RESP:  Physical Exam Vitals signs and nursing note reviewed.  Constitutional:      General: She is not in acute distress.    Appearance: Normal appearance. She is well-developed.  HENT:     Head: Normocephalic and atraumatic.     Nose: Nose normal.     Mouth/Throat:     Mouth: Mucous membranes are moist.     Pharynx: Oropharynx is clear.  Eyes:     Conjunctiva/sclera: Conjunctivae normal.     Pupils: Pupils are equal, round, and reactive to light.  Neck:      Musculoskeletal: Neck supple. Muscular tenderness present. No neck rigidity.  Cardiovascular:     Rate and Rhythm: Regular rhythm.  Pulmonary:     Effort: Pulmonary effort is normal. No respiratory distress.  Abdominal:     General: There is no distension.     Palpations: Abdomen is soft.     Tenderness: There is no guarding.  Musculoskeletal:     Right lower leg: No edema.     Left lower leg: No edema.     Comments: Cervical spine exam shows normal positioning of the cervical spine with pain with rotation and movement really in all planes.  She has focal trigger points in the trapezius levator scapula and rhomboids.  She does not really have any impingement sign although the right shoulder is painful at end ranges.  She has good strength in the upper extremities bilaterally but does not give as much effort on the right.  In terms of her low back she has pain with full extension with some pain with facet loading.  She has pain over the PSIS but not greater trochanter.  She has good distal strength without clonus.  Tight hamstring on the right compared to left  Skin:    General: Skin is warm and dry.     Findings: No erythema or rash.  Neurological:     General: No focal deficit present.     Mental Status: She is alert and oriented to person, place, and time.     Motor: No abnormal muscle tone.     Coordination: Coordination normal.     Gait: Gait normal.  Psychiatric:        Mood and Affect: Mood normal.        Behavior: Behavior normal.        Thought Content: Thought content normal.     Ortho Exam Imaging: Xr C-arm No Report  Result Date: 03/05/2019 Please see Notes tab for imaging impression.   Past Medical/Family/Surgical/Social History: Medications & Allergies reviewed per EMR, new medications updated. Patient Active Problem List   Diagnosis Date Noted   Nonallopathic lesion of cervical region 02/21/2019   Nonallopathic lesion of thoracic region 02/21/2019    Nonallopathic lesion of rib cage 02/21/2019   Cervical radiculopathy 04/20/2018   Chronic periscapular pain on right side 01/01/2018   Hematochezia 09/06/2017   Obesity (BMI 30.0-34.9) 09/17/2015   Breast mass in female 03/20/2015   Chronic right-sided low back pain without sciatica 08/01/2014  Carpal tunnel syndrome on right 08/01/2014   Palpitations 03/06/2014   Meniere's disease (cochlear hydrops) 01/25/2013   Allergic rhinitis 04/10/2012   Atypical migraine 05/02/2011   Tobacco use disorder 03/09/2010   Papanicolaou smear of cervix with positive high risk Hale papilloma virus (HPV) test 12/06/2007   GERD 10/16/2007   Benign essential HTN 02/07/2007   Mild intermittent asthma 02/07/2007   Past Medical History:  Diagnosis Date   Anemia    Asthma    GERD (gastroesophageal reflux disease)    Hypertension    Migraine headache    Family History  Problem Relation Age of Onset   Heart attack Maternal Grandmother    Hypertension Maternal Grandmother    Lung cancer Maternal Aunt    Colon cancer Neg Hx    Past Surgical History:  Procedure Laterality Date   KNEE SURGERY     Age 43   Social History   Occupational History   Not on file  Tobacco Use   Smoking status: Current Every Day Smoker    Packs/day: 0.25    Years: 5.00    Pack years: 1.25    Types: Cigarettes   Smokeless tobacco: Never Used  Substance and Sexual Activity   Alcohol use: Yes    Alcohol/week: 0.0 standard drinks    Comment: occasional   Drug use: Not Currently    Types: Marijuana   Sexual activity: Yes    Partners: Female    Comment: Lesbian

## 2019-03-06 NOTE — Procedures (Signed)
Cervical Epidural Steroid Injection - Interlaminar Approach with Fluoroscopic Guidance  [name]      Date of Birth: 10/09/1975 MRN: [id number] PCP: Briscoe Deutscher, DO      Visit Date: 03/05/2019   Universal Protocol:    Date/Time: 08/19/206:28 AM  Consent Given By: the patient  Position: PRONE  Additional Comments: Vital signs were monitored before and after the procedure. Patient was prepped and draped in the usual sterile fashion. The correct patient, procedure, and site was verified.   Injection Procedure Details:  Procedure Site One Meds Administered:  Meds ordered this encounter  Medications  . methylPREDNISolone acetate (DEPO-MEDROL) injection 80 mg     Laterality: Right  Location/Site: C7-T1  Needle size: 20 G  Needle type: Touhy  Needle Placement: Paramedian epidural space  Findings:  -Comments: Excellent flow of contrast into the epidural space.  Procedure Details: Using a paramedian approach from the side mentioned above, the region overlying the inferior lamina was localized under fluoroscopic visualization and the soft tissues overlying this structure were infiltrated with 4 ml. of 1% Lidocaine without Epinephrine. A # 20 gauge, Tuohy needle was inserted into the epidural space using a paramedian approach.  The epidural space was localized using loss of resistance along with lateral and contralateral oblique bi-planar fluoroscopic views.  After negative aspirate for air, blood, and CSF, a 2 ml. volume of Isovue-250 was injected into the epidural space and the flow of contrast was observed. Radiographs were obtained for documentation purposes.   The injectate was administered into the level noted above.  Additional Comments:  The patient tolerated the procedure well Dressing: 2 x 2 sterile gauze and Band-Aid    Post-procedure details: Patient was observed during the procedure. Post-procedure instructions were reviewed.  Patient left  the clinic in stable condition.

## 2019-03-07 ENCOUNTER — Other Ambulatory Visit: Payer: Self-pay

## 2019-03-07 ENCOUNTER — Encounter: Payer: Self-pay | Admitting: Family Medicine

## 2019-03-11 ENCOUNTER — Encounter: Payer: Self-pay | Admitting: Family Medicine

## 2019-03-12 ENCOUNTER — Encounter: Payer: Self-pay | Admitting: Family Medicine

## 2019-03-12 NOTE — Telephone Encounter (Signed)
Letter faxed.

## 2019-03-12 NOTE — Telephone Encounter (Signed)
Called no letter needed.

## 2019-03-12 NOTE — Telephone Encounter (Signed)
Called patient let her know letter updated and set. She is ok with staying on ibuprofen only until MRI back. She does not like Gabapentin due to side effects.

## 2019-03-18 ENCOUNTER — Encounter: Payer: Self-pay | Admitting: Physical Medicine and Rehabilitation

## 2019-03-18 DIAGNOSIS — M5416 Radiculopathy, lumbar region: Secondary | ICD-10-CM

## 2019-04-02 ENCOUNTER — Other Ambulatory Visit: Payer: Self-pay

## 2019-04-02 ENCOUNTER — Ambulatory Visit
Admission: RE | Admit: 2019-04-02 | Discharge: 2019-04-02 | Disposition: A | Discharge status: Home / Self Care | Payer: 59 | Source: Ambulatory Visit | Attending: Physical Medicine and Rehabilitation | Admitting: Physical Medicine and Rehabilitation

## 2019-04-02 DIAGNOSIS — M5416 Radiculopathy, lumbar region: Secondary | ICD-10-CM

## 2019-04-04 ENCOUNTER — Ambulatory Visit: Payer: 59 | Admitting: Family Medicine

## 2019-04-04 NOTE — Progress Notes (Deleted)
Tawana ScaleZach Choya Tornow D.O. [facility] [address] [place], [place] [address] Phone: [phone] Subjective:    I'm seeing this patient by the request  of:    CC:   BJY:[id number]:Subjective  Jessica Hale is a 43 y.o. female coming in with complaint of ***  Onset-  Location Duration-  Character- Aggravating factors- Reliving factors-  Therapies tried-  Severity-     Past Medical History:  Diagnosis Date  . Anemia   . Asthma   . GERD (gastroesophageal reflux disease)   . Hypertension   . Migraine headache    Past Surgical History:  Procedure Laterality Date  . KNEE SURGERY     Age 43   Social History   Socioeconomic History  . Marital status: Married    Spouse name: Not on file  . Number of children: Not on file  . Years of education: Not on file  . Highest education level: Not on file  Occupational History  . Not on file  Social Needs  . Financial resource strain: Not on file  . Food insecurity    Worry: Not on file    Inability: Not on file  . Transportation needs    Medical: Not on file    Non-medical: Not on file  Tobacco Use  . Smoking status: Current Every Day Smoker    Packs/day: 0.25    Years: 5.00    Pack years: 1.25    Types: Cigarettes  . Smokeless tobacco: Never Used  Substance and Sexual Activity  . Alcohol use: Yes    Alcohol/week: 0.0 standard drinks    Comment: occasional  . Drug use: Not Currently    Types: Marijuana  . Sexual activity: Yes    Partners: Female    Comment: Lesbian  Lifestyle  . Physical activity    Days per week: 4 days    Minutes per session: Not on file  . Stress: Not on file  Relationships  . Social Musicianconnections    Talks on phone: Not on file    Gets together: Not on file    Attends religious service: Not on file    Active member of club or organization: Not on file    Attends meetings of clubs or organizations: Not on file    Relationship status: Not on file  Other Topics Concern  . Not on file  Social  History Narrative  . Not on file   Allergies  Allergen Reactions  . Bee Venom Anaphylaxis   Family History  Problem Relation Age of Onset  . Heart attack Maternal Grandmother   . Hypertension Maternal Grandmother   . Lung cancer Maternal Aunt   . Colon cancer Neg Hx      Current Outpatient Medications (Cardiovascular):  Marland Kitchen.  EPIPEN 2-PAK 0.3 MG/0.3ML SOAJ injection, inject as directed WITH BEE STING .  hydrochlorothiazide (HYDRODIURIL) 25 MG tablet, TAKE 1/2 TABLET(12.5 MG) BY MOUTH DAILY .  metoprolol tartrate (LOPRESSOR) 25 MG tablet, TAKE 1/2 TABLET(12.5 MG) BY MOUTH TWICE DAILY  Current Outpatient Medications (Respiratory):  .  albuterol (VENTOLIN HFA) 108 (90 Base) MCG/ACT inhaler, Inhale 2 puffs into the lungs every 6 (six) hours as needed for wheezing or shortness of breath. .  beclomethasone (QVAR REDIHALER) 40 MCG/ACT inhaler, Inhale 2 puffs into the lungs 2 (two) times daily.  Current Outpatient Medications (Analgesics):  .  naproxen (NAPROSYN) 500 MG tablet,    Current Outpatient Medications (Other):  .  omeprazole (PRILOSEC) 40 MG capsule, TAKE  1 CAPSULE(40 MG) BY MOUTH DAILY .  zolpidem (AMBIEN) 5 MG tablet, TAKE 1 TABLET(5 MG) BY MOUTH AT BEDTIME AS NEEDED FOR SLEEP    Past medical history, social, surgical and family history all reviewed in electronic medical record.  No pertanent information unless stated regarding to the chief complaint.   Review of Systems:  No headache, visual changes, nausea, vomiting, diarrhea, constipation, dizziness, abdominal pain, skin rash, fevers, chills, night sweats, weight loss, swollen lymph nodes, body aches, joint swelling, muscle aches, chest pain, shortness of breath, mood changes.   Objective  There were no vitals taken for this visit. Systems examined below as of    General: No apparent distress alert and oriented x3 mood and affect normal, dressed appropriately.  HEENT: Pupils equal, extraocular movements intact   Respiratory: Patient's speak in full sentences and does not appear short of breath  Cardiovascular: No lower extremity edema, non tender, no erythema  Skin: Warm dry intact with no signs of infection or rash on extremities or on axial skeleton.  Abdomen: Soft nontender  Neuro: Cranial nerves II through XII are intact, neurovascularly intact in all extremities with 2+ DTRs and 2+ pulses.  Lymph: No lymphadenopathy of posterior or anterior cervical chain or axillae bilaterally.  Gait normal with good balance and coordination.  MSK:  Non tender with full range of motion and good stability and symmetric strength and tone of shoulders, elbows, wrist, hip, knee and ankles bilaterally.     Impression and Recommendations:     This case required medical decision making of moderate complexity. The above documentation has been reviewed and is accurate and complete Lyndal Pulley, DO       Note: This dictation was prepared with Dragon dictation along with smaller phrase technology. Any transcriptional errors that result from this process are unintentional.

## 2019-04-05 ENCOUNTER — Other Ambulatory Visit: Payer: 59

## 2019-04-08 ENCOUNTER — Encounter: Payer: Self-pay | Admitting: Family Medicine

## 2019-04-09 ENCOUNTER — Encounter: Payer: Self-pay | Admitting: Family Medicine

## 2019-04-10 ENCOUNTER — Encounter: Payer: Self-pay | Admitting: Family Medicine

## 2019-04-10 ENCOUNTER — Other Ambulatory Visit: Payer: Self-pay

## 2019-04-10 ENCOUNTER — Ambulatory Visit (INDEPENDENT_AMBULATORY_CARE_PROVIDER_SITE_OTHER): Payer: 59 | Admitting: Family Medicine

## 2019-04-10 VITALS — Ht 62.0 in | Wt 150.0 lb

## 2019-04-10 DIAGNOSIS — M545 Low back pain, unspecified: Secondary | ICD-10-CM

## 2019-04-10 DIAGNOSIS — G8929 Other chronic pain: Secondary | ICD-10-CM

## 2019-04-10 DIAGNOSIS — I951 Orthostatic hypotension: Secondary | ICD-10-CM

## 2019-04-10 NOTE — Progress Notes (Signed)
Virtual Visit via Video   Due to the COVID-19 pandemic, this visit was completed with telemedicine (audio/video) technology to reduce patient and provider exposure as well as to preserve personal protective equipment.   I connected with Jessica Hale by a video enabled telemedicine application and verified that I am speaking with the correct person using two identifiers. Location patient: Home Location provider: [facility], Office Persons participating in the virtual visit: Jessica Hale, Jessica Rima, DO Barnie Mort, CMA acting as scribe for Dr. Helane Hale.    I discussed the limitations of evaluation and management by telemedicine and the availability of in person appointments. The patient expressed understanding and agreed to proceed.  Care Team   Patient Care Team: Jessica Rima, DO as PCP - General (Family Medicine)  Subjective:   HPI: Patient states for last two weeks she has had dizzy spells. She has had change in eating habits and has had a 30lbs weight loss. She has not checked blood pressure after weight loss.   Review of Systems  Constitutional: Negative for chills and fever.  HENT: Negative for hearing loss and tinnitus.   Eyes: Negative for blurred vision and double vision.  Respiratory: Negative for cough and wheezing.   Cardiovascular: Negative for chest pain, palpitations and leg swelling.  Gastrointestinal: Negative for nausea and vomiting.  Genitourinary: Negative for dysuria and urgency.  Neurological: Positive for dizziness and headaches. Negative for tingling.  Psychiatric/Behavioral: Negative for depression and suicidal ideas.    Patient Active Problem List   Diagnosis Date Noted  . Nonallopathic lesion of cervical region 02/21/2019  . Nonallopathic lesion of thoracic region 02/21/2019  . Nonallopathic lesion of rib cage 02/21/2019  . Cervical radiculopathy 04/20/2018  . Chronic periscapular pain on right side 01/01/2018  . Hematochezia  09/06/2017  . Breast mass in female 03/20/2015  . Chronic right-sided low back pain without sciatica 08/01/2014  . Carpal tunnel syndrome on right 08/01/2014  . Palpitations 03/06/2014  . Meniere's disease (cochlear hydrops) 01/25/2013  . Allergic rhinitis 04/10/2012  . Atypical migraine 05/02/2011  . Papanicolaou smear of cervix with positive high risk human papilloma virus (HPV) test 12/06/2007  . GERD 10/16/2007  . Benign essential HTN 02/07/2007  . Mild intermittent asthma 02/07/2007    Social History   Tobacco Use  . Smoking status: Current Every Day Smoker    Packs/day: 0.25    Years: 5.00    Pack years: 1.25    Types: Cigarettes  . Smokeless tobacco: Never Used  Substance Use Topics  . Alcohol use: Yes    Alcohol/week: 0.0 standard drinks    Comment: occasional    Current Outpatient Medications:  .  albuterol (VENTOLIN HFA) 108 (90 Base) MCG/ACT inhaler, Inhale 2 puffs into the lungs every 6 (six) hours as needed for wheezing or shortness of breath., Disp: 1 Inhaler, Rfl: 1 .  beclomethasone (QVAR REDIHALER) 40 MCG/ACT inhaler, Inhale 2 puffs into the lungs 2 (two) times daily., Disp: 2 Inhaler, Rfl: 6 .  EPIPEN 2-PAK 0.3 MG/0.3ML SOAJ injection, inject as directed WITH BEE STING, Disp: , Rfl: 0 .  hydrochlorothiazide (HYDRODIURIL) 25 MG tablet, TAKE 1/2 TABLET(12.5 MG) BY MOUTH DAILY, Disp: 30 tablet, Rfl: 11 .  metoprolol tartrate (LOPRESSOR) 25 MG tablet, TAKE 1/2 TABLET(12.5 MG) BY MOUTH TWICE DAILY, Disp: 60 tablet, Rfl: 11 .  naproxen (NAPROSYN) 500 MG tablet, , Disp: , Rfl:  .  omeprazole (PRILOSEC) 40 MG capsule, TAKE 1 CAPSULE(40 MG) BY MOUTH DAILY,  Disp: 30 capsule, Rfl: 3 .  zolpidem (AMBIEN) 5 MG tablet, TAKE 1 TABLET(5 MG) BY MOUTH AT BEDTIME AS NEEDED FOR SLEEP, Disp: 30 tablet, Rfl: 3  Allergies  Allergen Reactions  . Bee Venom Anaphylaxis    Objective:   VITALS: Per patient if applicable, see vitals. GENERAL: Alert, appears well and in no acute  distress. HEENT: Atraumatic, conjunctiva clear, no obvious abnormalities on inspection of external nose and ears. NECK: Normal movements of the head and neck. CARDIOPULMONARY: No increased WOB. Speaking in clear sentences. I:E ratio WNL.  MS: Moves all visible extremities without noticeable abnormality. PSYCH: Pleasant and cooperative, well-groomed. Speech normal rate and rhythm. Affect is appropriate. Insight and judgement are appropriate. Attention is focused, linear, and appropriate.  NEURO: CN grossly intact. Oriented as arrived to appointment on time with no prompting. Moves both UE equally.  SKIN: No obvious lesions, wounds, erythema, or cyanosis noted on face or hands.  Depression screen [facility] 2/9 01/11/2019 08/17/2018 12/29/2017  Decreased Interest 3 0 0  Down, Depressed, Hopeless 3 0 0  PHQ - 2 Score 6 0 0  Altered sleeping 3 2 -  Tired, decreased energy 3 1 -  Change in appetite 3 0 -  Feeling bad or failure about yourself  3 0 -  Trouble concentrating 3 0 -  Moving slowly or fidgety/restless 0 1 -  Suicidal thoughts 0 0 -  PHQ-9 Score 21 4 -  Difficult doing work/chores Extremely dIfficult Not difficult at all -  Some recent data might be hidden    Assessment and Plan:   [name] was seen today for dizziness.  Diagnoses and all orders for this visit:  Orthostatic hypotension Comments: Hold Metoprolol. Monitor BP and HR. Will get labs at wife's office - CBC, CMP, TIBC, mag, B12.  Chronic right-sided low back pain without sciatica Comments: Along with midback pain, paresthesias. Still working on crew despite FMLA restrictions. I've asked her to resend paperwork so that we can more clear.   Marland Kitchen COVID-19 Education: The signs and symptoms of COVID-19 were discussed with the patient and how to seek care for testing if needed. The importance of social distancing was discussed today. . Reviewed expectations re: course of current medical issues. . Discussed self-management of  symptoms. . Outlined signs and symptoms indicating need for more acute intervention. . Patient verbalized understanding and all questions were answered. Marland Kitchen Health Maintenance issues including appropriate healthy diet, exercise, and smoking avoidance were discussed with patient. . See orders for this visit as documented in the electronic medical record.  Briscoe Deutscher, DO  Records requested if needed. Time spent: 15 minutes, of which >50% was spent in obtaining information about her symptoms, reviewing her previous labs, evaluations, and treatments, counseling her about her condition (please see the discussed topics above), and developing a plan to further investigate it; she had a number of questions which I addressed.

## 2019-04-10 NOTE — Patient Instructions (Signed)
Labs: CBC, CMP, mag, TIBC, B12. Send FMLA paperwork again. Tell your wife that she is super smart.

## 2019-04-10 NOTE — Telephone Encounter (Signed)
Called patient seen today

## 2019-04-11 ENCOUNTER — Other Ambulatory Visit: Payer: Self-pay

## 2019-04-11 DIAGNOSIS — I10 Essential (primary) hypertension: Secondary | ICD-10-CM

## 2019-04-11 DIAGNOSIS — J4520 Mild intermittent asthma, uncomplicated: Secondary | ICD-10-CM

## 2019-04-11 MED ORDER — OMEPRAZOLE 40 MG PO CPDR: DELAYED_RELEASE_CAPSULE | ORAL | 3 refills | Status: DC

## 2019-04-11 MED ORDER — ALBUTEROL SULFATE HFA 108 (90 BASE) MCG/ACT IN AERS: 2.0000 | INHALATION_SPRAY | Freq: Four times a day (QID) | RESPIRATORY_TRACT | 3 refills | Status: DC | PRN

## 2019-04-11 MED ORDER — HYDROCHLOROTHIAZIDE 25 MG PO TABS: ORAL_TABLET | ORAL | 3 refills | Status: DC

## 2019-04-11 MED ORDER — QVAR REDIHALER 40 MCG/ACT IN AERB: 2.0000 | INHALATION_SPRAY | Freq: Two times a day (BID) | RESPIRATORY_TRACT | 3 refills | Status: DC

## 2019-04-16 ENCOUNTER — Encounter: Payer: Self-pay | Admitting: Physical Medicine and Rehabilitation

## 2019-04-16 ENCOUNTER — Ambulatory Visit (INDEPENDENT_AMBULATORY_CARE_PROVIDER_SITE_OTHER): Payer: 59 | Admitting: Physical Medicine and Rehabilitation

## 2019-04-16 ENCOUNTER — Other Ambulatory Visit: Payer: Self-pay

## 2019-04-16 VITALS — BP 146/91 | HR 65

## 2019-04-16 DIAGNOSIS — M5010 Cervical disc disorder with radiculopathy, unspecified cervical region: Secondary | ICD-10-CM | POA: Diagnosis not present

## 2019-04-16 DIAGNOSIS — G5622 Lesion of ulnar nerve, left upper limb: Secondary | ICD-10-CM

## 2019-04-16 DIAGNOSIS — M25511 Pain in right shoulder: Secondary | ICD-10-CM | POA: Diagnosis not present

## 2019-04-16 DIAGNOSIS — G8929 Other chronic pain: Secondary | ICD-10-CM

## 2019-04-16 DIAGNOSIS — G894 Chronic pain syndrome: Secondary | ICD-10-CM

## 2019-04-16 DIAGNOSIS — M5441 Lumbago with sciatica, right side: Secondary | ICD-10-CM | POA: Diagnosis not present

## 2019-04-16 DIAGNOSIS — G5601 Carpal tunnel syndrome, right upper limb: Secondary | ICD-10-CM

## 2019-04-16 DIAGNOSIS — R202 Paresthesia of skin: Secondary | ICD-10-CM | POA: Diagnosis not present

## 2019-04-16 NOTE — Progress Notes (Signed)
Numeric Pain Rating Scale and Functional Assessment Average Pain 7   In the last MONTH (on 0-10 scale) has pain interfered with the following?  1. General activity like being  able to carry out your everyday physical activities such as walking, climbing stairs, carrying groceries, or moving a chair?  Rating(7)   

## 2019-04-17 ENCOUNTER — Encounter: Payer: Self-pay | Admitting: Physical Medicine and Rehabilitation

## 2019-04-17 ENCOUNTER — Ambulatory Visit: Payer: 59 | Admitting: Family Medicine

## 2019-04-17 NOTE — Progress Notes (Signed)
Jessica Hale - 43 y.o. female MRN [id number]  Date of birth: 04-07-1976  Office Visit Note: Visit Date: 04/16/2019 PCP: Helane Rima, DO Referred by: Helane Rima, DO  Subjective: Chief Complaint  Patient presents with   Lower Back - Pain   Right Leg - Edema, Pain, Numbness   Right Foot - Numbness   Left Foot - Numbness   Right Hand - Numbness   Left Hand - Numbness   HPI: Jessica Hale is a 43 y.o. female who comes in today For reevaluation and management of neck pain with shoulder pain with bilateral numbness and tingling as well as low back pain with right more than left numbness and tingling and generalized body pain.  Since I have seen her she has completed MRI of the lumbar spine.  Our previous note can be reviewed in terms of her issues with her back and the numbness and tingling in the leg.  By way of review she has been managed by her primary care physician Dr. Earlene Plater and at one point was seeing Dr. Gaspar Bidding before he left that practice.  We began seeing her because of neck pain and right arm with numbness and tingling that turned into both hands by the time I saw her.  Cervical MRI had been completed and there were findings at C5-6 and that MRI is reviewed below.  Cervical epidural injection helped to a mild degree for a short-term.  She continued to have physical therapy with dry needling as well as medication management through Dr. Earlene Plater.  I did send her to Dr. Nita Sickle for electrodiagnostic study of the upper limbs thinking she may have had some level of local median neuropathy or ulnar neuropathy which she did but to a mild degree.  Those electrodiagnostic studies are reviewed below.  Patient still complaining of right-sided lower back pain radiating down the leg with numbness and tingling in a nondermatomal fashion on the right.  She reports numbness and tingling in both hands and feet.  She reports some swelling of the right leg is generalized and does  come and go.  Her average pain is 7 out of 10.  She is very frustrated with the amount of symptoms and pain that she is having which is unrelenting at this point.  She continues to work.  She has not reported focal weakness or foot drop.  She has been diagnosed in the past with atypical migraine.  Review of Systems  Constitutional: Negative for chills, fever, malaise/fatigue and weight loss.  HENT: Negative for hearing loss and sinus pain.   Eyes: Negative for blurred vision, double vision and photophobia.  Respiratory: Negative for cough and shortness of breath.   Cardiovascular: Negative for chest pain, palpitations and leg swelling.  Gastrointestinal: Negative for abdominal pain, nausea and vomiting.  Genitourinary: Negative for flank pain.  Musculoskeletal: Positive for back pain, joint pain and neck pain. Negative for myalgias.  Skin: Negative for itching and rash.  Neurological: Positive for tingling. Negative for tremors, focal weakness and weakness.  Endo/Heme/Allergies: Negative.   Psychiatric/Behavioral: Negative for depression.  All other systems reviewed and are negative.  Otherwise per HPI.  Assessment & Plan: Visit Diagnoses:  1. Paresthesia of skin   2. Chronic bilateral low back pain with right-sided sciatica   3. Cervical disc disorder with radiculopathy   4. Chronic right shoulder pain   5. Carpal tunnel syndrome, right upper limb   6. Ulnar nerve entrapment at elbow, left  7. Chronic pain syndrome     Plan: Findings:  Progressive all over body pain and paresthesia which was initially looked at is more of a cervical issue with more radicular type pain in the right arm but has progressed to have numbness and tingling in both hands and both feet.  Electrodiagnostic study of the upper limbs showed mild peripheral nerve focal compression and MRI of the cervical spine did show issue C5-6 and epidural injection was diagnostic to a degree and that it helped short-term.  She  really not interested in any surgical issues there.  Since we saw her for follow-up of her cervical spine she complained of back and leg pain particularly paresthesias in the right more than left leg so we did obtain MRI of the lumbar spine.  This MRI was essentially normal for her age.  Small bit of degenerative change at L4-5 with no focal nerve compression nothing to explain any right-sided numbness or tingling.  She is very frustrated with the fact that she just cannot get any relief and this is unrelenting pain and paresthesias that are ongoing.  At this point I am going to make a referral back to Dr. Narda Amber for more of an evaluation of the numbness and tingling and not just strictly electrodiagnostic study.  Main question would be could this be a small fiber type neuropathy versus a central nervous system problem.  If she feels that this is nothing really from a neurological standpoint everything looks okay from that standpoint I would have her follow-up with Dr. Juleen China for further recommendations and referral.  Consideration might be given to referral for work-up from a rheumatological standpoint as I do not know if that is been done.  She may have some underlying central sensitization pain syndrome.  Unfortunately Dr. Juleen China is also moving practices and the patient is trying to find a primary care physician.  I know that Stacy will be able to find someone for her.  At this point I really do not have much else to offer this patient.    Meds & Orders: No orders of the defined types were placed in this encounter.   Orders Placed This Encounter  Procedures   Ambulatory referral to Neurology    Follow-up: No follow-ups on file.   Procedures: No procedures performed  No notes on file   Clinical History: MRI LUMBAR SPINE WITHOUT CONTRAST  TECHNIQUE: Multiplanar, multisequence MR imaging of the lumbar spine was performed. No intravenous contrast was administered.  COMPARISON:   None.  FINDINGS: Segmentation:  Partial sacralization of L5.  Alignment:  Physiologic.  Vertebrae:  No fracture, evidence of discitis, or bone lesion.  Conus medullaris and cauda equina: Conus extends to the L1 level. Conus and cauda equina appear normal.  Paraspinal and other soft tissues: Negative.  Disc levels:  T11-T12 to L3-L4: Negative.  L4-L5: Mild disc desiccation with small shallow broad-based posterior disc protrusion. Mild bilateral facet arthropathy. Borderline mild bilateral neuroforaminal stenosis. No spinal canal stenosis.  L5-S1: Negative disc. Mild bilateral facet arthropathy. No stenosis.  IMPRESSION: 1. Mild spondylosis at L4-L5.  No stenosis or impingement.   Electronically Signed   By: Titus Dubin M.D.   On: 04/02/2019 15:39 ----- NCV & EMG Findings: 05/10/2018  Extensive electrodiagnostic testing of the right upper extremity and additional studies of the left shows:  1. Right mixed palmar sensory responses show prolonged latency. Bilateral median, bilateral ulnar, and left mixed palmar sensory responses are within normal limits. 2.  Bilateral median and right ulnar motor responses are within normal limits. Left ulnar motor response shows decreased conduction velocity across the elbow (A Elbow-B Elbow, 45 m/s).  3. There is no evidence of active or chronic motor axonal loss changes affecting any of the tested muscles. Motor unit configuration and recruitment pattern is within normal limits.  Impression: 1. Right median neuropathy at or distal to the wrist, consistent with a clinical diagnosis of carpal tunnel syndrome. Overall, these findings are very mild in degree electrically. 2. Left ulnar neuropathy with slowing across the elbow, purely demyelinating and type and mild in degree electrically.   ___________________________ [name], DO  MRI CERVICAL SPINE WITHOUT CONTRAST  TECHNIQUE: Multiplanar, multisequence MR  imaging of the cervical spine was performed. No intravenous contrast was administered.  COMPARISON: None.  FINDINGS: ALIGNMENT: Straightened cervical lordosis. No malalignment.  VERTEBRAE/DISCS: Vertebral bodies are intact. Intervertebral disc morphology's and signal are normal. Mild chronic discogenic endplate changes C5-6. No abnormal or acute bone marrow signal.  CORD:Cervical spinal cord is normal morphology and signal characteristics from the cervicomedullary junction to level of T2-3, the most caudal well visualized level.  POSTERIOR FOSSA, VERTEBRAL ARTERIES, PARASPINAL TISSUES: No MR findings of ligamentous injury. Vertebral artery flow voids present. Included posterior fossa and paraspinal soft tissues are normal.  DISC LEVELS:  C2-3 through C4-5: No disc bulge, canal stenosis nor neural foraminal narrowing.  C5-6: Small central disc protrusion and annular fissure contacting the ventral spinal cord, mild canal stenosis. No neural foraminal narrowing.  C6-7: 1-2 mm central disc protrusion with annular fissure. No canal stenosis or neural foraminal narrowing.  C7-T1: No disc bulge, canal stenosis nor neural foraminal narrowing.  IMPRESSION: 1. Small C5-6 and C6-7 disc protrusions with annular fissures. No fracture or malalignment. 2. Mild canal stenosis C5-6. No neural foraminal narrowing.   Electronically Signed By: Awilda Metroourtnay Bloomer M.D. On: 03/31/2018 23:54   She reports that she has been smoking cigarettes. She has a 1.25 pack-year smoking history. She has never used smokeless tobacco.  Recent Labs    04/20/18 1013  HGBA1C 5.5    Objective:  VS:  HT:     WT:    BMI:      BP:(!) 146/91   HR:65bpm   TEMP: ( )   RESP:  Physical Exam Vitals signs and nursing note reviewed.  Constitutional:      General: She is not in acute distress.    Appearance: Normal appearance. She is well-developed. She is not ill-appearing.  HENT:     Head:  Normocephalic and atraumatic.  Eyes:     Conjunctiva/sclera: Conjunctivae normal.     Pupils: Pupils are equal, round, and reactive to light.  Neck:     Musculoskeletal: Neck supple. No neck rigidity.  Cardiovascular:     Rate and Rhythm: Normal rate.     Pulses: Normal pulses.  Pulmonary:     Effort: Pulmonary effort is normal.  Musculoskeletal:     Right lower leg: No edema.     Left lower leg: No edema.     Comments: Patient stands and ambulates without aid.  She has some pain with extension but no focal pain.  She is tender across the lower back to light palpation and deep palpation.  No focal trigger points noted.  Pain over the paraspinal regions and PSIS and greater trochanters.  No pain with hip rotation good distal strength.  Lymphadenopathy:     Cervical: No cervical adenopathy.  Skin:  General: Skin is warm and dry.     Findings: No erythema or rash.  Neurological:     General: No focal deficit present.     Mental Status: She is alert and oriented to person, place, and time.     Sensory: Sensory deficit present.     Motor: No abnormal muscle tone.     Coordination: Coordination normal.     Gait: Gait normal.     Comments: Subjective sensory exam as the patient feels that the sensations are different.  They are nondermatomal.  Psychiatric:        Mood and Affect: Mood normal.        Behavior: Behavior normal.     Ortho Exam Imaging: No results found.  Past Medical/Family/Surgical/Social History: Medications & Allergies reviewed per EMR, new medications updated. Patient Active Problem List   Diagnosis Date Noted   Nonallopathic lesion of cervical region 02/21/2019   Nonallopathic lesion of thoracic region 02/21/2019   Nonallopathic lesion of rib cage 02/21/2019   Cervical radiculopathy 04/20/2018   Chronic periscapular pain on right side 01/01/2018   Hematochezia 09/06/2017   Breast mass in female 03/20/2015   Chronic right-sided low back pain  without sciatica 08/01/2014   Carpal tunnel syndrome on right 08/01/2014   Palpitations 03/06/2014   Meniere's disease (cochlear hydrops) 01/25/2013   Allergic rhinitis 04/10/2012   Atypical migraine 05/02/2011   Papanicolaou smear of cervix with positive high risk human papilloma virus (HPV) test 12/06/2007   GERD 10/16/2007   Benign essential HTN 02/07/2007   Mild intermittent asthma 02/07/2007   Past Medical History:  Diagnosis Date   Anemia    Asthma    GERD (gastroesophageal reflux disease)    Hypertension    Migraine headache    Family History  Problem Relation Age of Onset   Heart attack Maternal Grandmother    Hypertension Maternal Grandmother    Lung cancer Maternal Aunt    Colon cancer Neg Hx    Past Surgical History:  Procedure Laterality Date   KNEE SURGERY     Age 79   Social History   Occupational History   Not on file  Tobacco Use   Smoking status: Current Every Day Smoker    Packs/day: 0.25    Years: 5.00    Pack years: 1.25    Types: Cigarettes   Smokeless tobacco: Never Used  Substance and Sexual Activity   Alcohol use: Yes    Alcohol/week: 0.0 standard drinks    Comment: occasional   Drug use: Not Currently    Types: Marijuana   Sexual activity: Yes    Partners: Female    Comment: Lesbian

## 2019-04-21 ENCOUNTER — Encounter: Payer: Self-pay | Admitting: Family Medicine

## 2019-04-22 ENCOUNTER — Other Ambulatory Visit: Payer: Self-pay

## 2019-04-22 DIAGNOSIS — J4520 Mild intermittent asthma, uncomplicated: Secondary | ICD-10-CM

## 2019-04-22 MED ORDER — QVAR REDIHALER 40 MCG/ACT IN AERB: 2.0000 | INHALATION_SPRAY | Freq: Two times a day (BID) | RESPIRATORY_TRACT | 3 refills | Status: DC

## 2019-04-24 ENCOUNTER — Ambulatory Visit: Payer: 59 | Admitting: Family Medicine

## 2019-04-28 ENCOUNTER — Encounter: Payer: Self-pay | Admitting: Family Medicine

## 2019-04-28 NOTE — Progress Notes (Deleted)
[facility] [address], [place] [address] Phone: [phone] Subjective:    I'm seeing this patient by the request  of:    CC: Neck and back pain follow-up  JIR:[id number]  Jessica Hale is a 43 y.o. female coming in with complaint of ***  Onset-  Location Duration-  Character- Aggravating factors- Reliving factors-  Therapies tried-  Severity-     Past Medical History:  Diagnosis Date  . Anemia   . Asthma   . GERD (gastroesophageal reflux disease)   . Hypertension   . Migraine headache    Past Surgical History:  Procedure Laterality Date  . KNEE SURGERY     Age 36   Social History   Socioeconomic History  . Marital status: Married    Spouse name: Not on file  . Number of children: Not on file  . Years of education: Not on file  . Highest education level: Not on file  Occupational History  . Not on file  Social Needs  . Financial resource strain: Not on file  . Food insecurity    Worry: Not on file    Inability: Not on file  . Transportation needs    Medical: Not on file    Non-medical: Not on file  Tobacco Use  . Smoking status: Current Every Day Smoker    Packs/day: 0.25    Years: 5.00    Pack years: 1.25    Types: Cigarettes  . Smokeless tobacco: Never Used  Substance and Sexual Activity  . Alcohol use: Yes    Alcohol/week: 0.0 standard drinks    Comment: occasional  . Drug use: Not Currently    Types: Marijuana  . Sexual activity: Yes    Partners: Female    Comment: Lesbian  Lifestyle  . Physical activity    Days per week: 4 days    Minutes per session: Not on file  . Stress: Not on file  Relationships  . Social Herbalist on phone: Not on file    Gets together: Not on file    Attends religious service: Not on file    Active member of club or organization: Not on file    Attends meetings of clubs or organizations: Not on file    Relationship status: Not on file  Other Topics  Concern  . Not on file  Social History Narrative  . Not on file   Allergies  Allergen Reactions  . Bee Venom Anaphylaxis   Family History  Problem Relation Age of Onset  . Heart attack Maternal Grandmother   . Hypertension Maternal Grandmother   . Lung cancer Maternal Aunt   . Colon cancer Neg Hx      Current Outpatient Medications (Cardiovascular):  Marland Kitchen  EPIPEN 2-PAK 0.3 MG/0.3ML SOAJ injection, inject as directed WITH BEE STING .  hydrochlorothiazide (HYDRODIURIL) 25 MG tablet, TAKE 1/2 TABLET(12.5 MG) BY MOUTH DAILY  Current Outpatient Medications (Respiratory):  .  albuterol (VENTOLIN HFA) 108 (90 Base) MCG/ACT inhaler, Inhale 2 puffs into the lungs every 6 (six) hours as needed for wheezing or shortness of breath. .  beclomethasone (QVAR REDIHALER) 40 MCG/ACT inhaler, Inhale 2 puffs into the lungs 2 (two) times daily.  Current Outpatient Medications (Analgesics):  .  naproxen (NAPROSYN) 500 MG tablet,    Current Outpatient Medications (Other):  .  omeprazole (PRILOSEC) 40 MG capsule, TAKE 1 CAPSULE(40 MG) BY MOUTH DAILY .  zolpidem (AMBIEN) 5 MG  tablet, TAKE 1 TABLET(5 MG) BY MOUTH AT BEDTIME AS NEEDED FOR SLEEP    Past medical history, social, surgical and family history all reviewed in electronic medical record.  No pertanent information unless stated regarding to the chief complaint.   Review of Systems:  No headache, visual changes, nausea, vomiting, diarrhea, constipation, dizziness, abdominal pain, skin rash, fevers, chills, night sweats, weight loss, swollen lymph nodes, body aches, joint swelling, muscle aches, chest pain, shortness of breath, mood changes.   Objective  There were no vitals taken for this visit. Systems examined below as of    General: No apparent distress alert and oriented x3 mood and affect normal, dressed appropriately.  HEENT: Pupils equal, extraocular movements intact  Respiratory: Patient's speak in full sentences and does not appear  short of breath  Cardiovascular: No lower extremity edema, non tender, no erythema  Skin: Warm dry intact with no signs of infection or rash on extremities or on axial skeleton.  Abdomen: Soft nontender  Neuro: Cranial nerves II through XII are intact, neurovascularly intact in all extremities with 2+ DTRs and 2+ pulses.  Lymph: No lymphadenopathy of posterior or anterior cervical chain or axillae bilaterally.  Gait normal with good balance and coordination.  MSK:  Non tender with full range of motion and good stability and symmetric strength and tone of shoulders, elbows, wrist, hip, knee and ankles bilaterally.  Neck: Inspection unremarkable. No palpable stepoffs. Negative Spurling's maneuver. Full neck range of motion Grip strength and sensation normal in bilateral hands Strength good C4 to T1 distribution No sensory change to C4 to T1 Negative Hoffman sign bilaterally Reflexes normal  Osteopathic findings Cervical C2 flexed rotated and side bent right C4 flexed rotated and side bent left C6 flexed rotated and side bent left T3 extended rotated and side bent right inhaled third rib T9 extended rotated and side bent left L2 flexed rotated and side bent right Sacrum right on right    Impression and Recommendations:     This case required medical decision making of moderate complexity. The above documentation has been reviewed and is accurate and complete Judi Saa, DO       Note: This dictation was prepared with Dragon dictation along with smaller phrase technology. Any transcriptional errors that result from this process are unintentional.

## 2019-04-29 ENCOUNTER — Encounter: Payer: Self-pay | Admitting: Family Medicine

## 2019-04-29 ENCOUNTER — Other Ambulatory Visit: Payer: Self-pay

## 2019-04-29 MED ORDER — AZITHROMYCIN 250 MG PO TABS: ORAL_TABLET | ORAL | 0 refills | Status: DC

## 2019-04-29 MED ORDER — FLUCONAZOLE 150 MG PO TABS: ORAL_TABLET | ORAL | 0 refills | Status: DC

## 2019-04-29 NOTE — Telephone Encounter (Signed)
See note  Copied from [place] [id number]. Topic: General - Other >> Apr 29, 2019  2:08 PM Pauline Good wrote: Reason for CRM: pt need answers concerning a personal matter and wouldn't say what it is concerning Please call pt

## 2019-04-29 NOTE — Telephone Encounter (Signed)
See other open message.

## 2019-04-30 ENCOUNTER — Ambulatory Visit: Payer: 59 | Admitting: Family Medicine

## 2019-05-01 ENCOUNTER — Telehealth: Payer: Self-pay | Admitting: Family Medicine

## 2019-05-01 ENCOUNTER — Other Ambulatory Visit: Payer: Self-pay

## 2019-05-01 DIAGNOSIS — J4520 Mild intermittent asthma, uncomplicated: Secondary | ICD-10-CM

## 2019-05-01 MED ORDER — QVAR REDIHALER 40 MCG/ACT IN AERB: 2.0000 | INHALATION_SPRAY | Freq: Two times a day (BID) | RESPIRATORY_TRACT | 4 refills | Status: DC

## 2019-05-01 NOTE — Telephone Encounter (Signed)
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   Called in to send a fax in regards to patients returning to work and duties . Please advise         Call back [phone] Fax number [phone]

## 2019-05-01 NOTE — Telephone Encounter (Signed)
See note

## 2019-05-02 ENCOUNTER — Encounter: Payer: Self-pay | Admitting: Family Medicine

## 2019-05-02 ENCOUNTER — Encounter (HOSPITAL_COMMUNITY): Payer: Self-pay

## 2019-05-02 ENCOUNTER — Ambulatory Visit (HOSPITAL_COMMUNITY)
Admission: EM | Admit: 2019-05-02 | Discharge: 2019-05-02 | Disposition: A | Discharge status: Home / Self Care | Payer: 59 | Attending: Emergency Medicine | Admitting: Emergency Medicine

## 2019-05-02 ENCOUNTER — Other Ambulatory Visit: Payer: Self-pay

## 2019-05-02 DIAGNOSIS — I10 Essential (primary) hypertension: Secondary | ICD-10-CM | POA: Diagnosis not present

## 2019-05-02 DIAGNOSIS — R42 Dizziness and giddiness: Secondary | ICD-10-CM | POA: Diagnosis not present

## 2019-05-02 MED ORDER — MECLIZINE HCL 12.5 MG PO TABS: 12.5000 mg | ORAL_TABLET | Freq: Three times a day (TID) | ORAL | 0 refills | Status: DC | PRN

## 2019-05-02 NOTE — ED Triage Notes (Signed)
Pt presents with ongoing sinus pain and  Congestion with no relief after taking OTC medication, pt states she was recently taken off of one of her blood pressure medications and that he blood pressure started going back up.

## 2019-05-02 NOTE — ED Provider Notes (Signed)
[facility]    CSN: [id number] Arrival date & time: 05/02/19  1830      History   Chief Complaint Chief Complaint  Patient presents with  . Dizziness  . Sinus Congestion    HPI Jessica Hale is a 43 y.o. female.   Patient presents with dizziness x1 month which feels like the room is spinning.  She also reports sinus headache and congestion.  She has been taking over-the-counter Sudafed and TheraFlu.  She was taken off of metoprolol on 04/10/2019 by her PCP due to her report of dizziness.  She denies fever, chills, weakness, cough, shortness of breath, or other symptoms.  Patient reports she has a follow-up appointment with her PCP on Monday, 05/06/2019 but will call tomorrow to see if she can be seen.    The history is provided by the patient.    Past Medical History:  Diagnosis Date  . Anemia   . Asthma   . GERD (gastroesophageal reflux disease)   . Hypertension   . Migraine headache     Patient Active Problem List   Diagnosis Date Noted  . Nonallopathic lesion of cervical region 02/21/2019  . Nonallopathic lesion of thoracic region 02/21/2019  . Nonallopathic lesion of rib cage 02/21/2019  . Cervical radiculopathy 04/20/2018  . Chronic periscapular pain on right side 01/01/2018  . Hematochezia 09/06/2017  . Breast mass in female 03/20/2015  . Chronic right-sided low back pain without sciatica 08/01/2014  . Carpal tunnel syndrome on right 08/01/2014  . Palpitations 03/06/2014  . Meniere's disease (cochlear hydrops) 01/25/2013  . Allergic rhinitis 04/10/2012  . Atypical migraine 05/02/2011  . Papanicolaou smear of cervix with positive high risk human papilloma virus (HPV) test 12/06/2007  . GERD 10/16/2007  . Benign essential HTN 02/07/2007  . Mild intermittent asthma 02/07/2007    Past Surgical History:  Procedure Laterality Date  . KNEE SURGERY     Age 43    OB History   No obstetric history on file.      Home Medications    Prior to  Admission medications   Medication Sig Start Date End Date Taking? Authorizing Provider  albuterol (VENTOLIN HFA) 108 (90 Base) MCG/ACT inhaler Inhale 2 puffs into the lungs every 6 (six) hours as needed for wheezing or shortness of breath. 04/11/19   Helane RimaWallace, [name], DO  azithromycin (ZITHROMAX) 250 MG tablet 2 tab day one then one a day after 04/29/19   Helane RimaWallace, [name], DO  beclomethasone (QVAR REDIHALER) 40 MCG/ACT inhaler Inhale 2 puffs into the lungs 2 (two) times daily. 05/01/19   Helane RimaWallace, [name], DO  EPIPEN 2-PAK 0.3 MG/0.3ML SOAJ injection inject as directed WITH BEE STING 12/25/14   [provider]  fluconazole (DIFLUCAN) 150 MG tablet Take one tab if still having symptoms in three days take second. 04/29/19   Helane RimaWallace, [name], DO  hydrochlorothiazide (HYDRODIURIL) 25 MG tablet TAKE 1/2 TABLET(12.5 MG) BY MOUTH DAILY 04/11/19   Helane RimaWallace, [name], DO  meclizine (ANTIVERT) 12.5 MG tablet Take 1 tablet (12.5 mg total) by mouth 3 (three) times daily as needed for dizziness. 05/02/19   Mickie Bailate, Rayson Rando H, NP  naproxen (NAPROSYN) 500 MG tablet  02/13/19   [provider]  omeprazole (PRILOSEC) 40 MG capsule TAKE 1 CAPSULE(40 MG) BY MOUTH DAILY 04/11/19   Helane RimaWallace, [name], DO  zolpidem (AMBIEN) 5 MG tablet TAKE 1 TABLET(5 MG) BY MOUTH AT BEDTIME AS NEEDED FOR SLEEP 02/26/19   Helane RimaWallace, [name], DO    Family History  Family History  Problem Relation Age of Onset  . Heart attack Maternal Grandmother   . Hypertension Maternal Grandmother   . Lung cancer Maternal Aunt   . Colon cancer Neg Hx     Social History Social History   Tobacco Use  . Smoking status: Current Every Day Smoker    Packs/day: 0.25    Years: 5.00    Pack years: 1.25    Types: Cigarettes  . Smokeless tobacco: Never Used  Substance Use Topics  . Alcohol use: Yes    Alcohol/week: 0.0 standard drinks    Comment: occasional  . Drug use: Not Currently    Types: Marijuana     Allergies   Bee venom   Review of Systems  Review of Systems  Constitutional: Negative for chills and fever.  HENT: Positive for congestion and sinus pressure. Negative for ear pain and sore throat.   Eyes: Negative for pain and visual disturbance.  Respiratory: Negative for cough and shortness of breath.   Cardiovascular: Negative for chest pain and palpitations.  Gastrointestinal: Negative for abdominal pain and vomiting.  Genitourinary: Negative for dysuria and hematuria.  Musculoskeletal: Negative for arthralgias and back pain.  Skin: Negative for color change and rash.  Neurological: Positive for dizziness and headaches. Negative for seizures, syncope, facial asymmetry, speech difficulty, weakness and numbness.  All other systems reviewed and are negative.    Physical Exam Triage Vital Signs ED Triage Vitals  Enc Vitals Group     BP      Pulse      Resp      Temp      Temp src      SpO2      Weight      Height      Head Circumference      Peak Flow      Pain Score      Pain Loc      Pain Edu?      Excl. in GC?    No data found.  Updated Vital Signs BP (!) 158/105 (BP Location: Right Arm)   Pulse 80   Temp 98.8 F (37.1 C) (Oral)   Resp 17   LMP 04/25/2019   SpO2 100%   Visual Acuity Right Eye Distance:   Left Eye Distance:   Bilateral Distance:    Right Eye Near:   Left Eye Near:    Bilateral Near:     Physical Exam Vitals signs and nursing note reviewed.  Constitutional:      General: She is not in acute distress.    Appearance: She is well-developed. She is not ill-appearing.  HENT:     Head: Normocephalic and atraumatic.     Right Ear: Tympanic membrane normal.     Left Ear: Tympanic membrane normal.     Nose: Nose normal.     Mouth/Throat:     Mouth: Mucous membranes are moist.     Pharynx: Oropharynx is clear.  Eyes:     Conjunctiva/sclera: Conjunctivae normal.  Neck:     Musculoskeletal: Neck supple.  Cardiovascular:     Rate and Rhythm: Normal rate and regular rhythm.      Heart sounds: No murmur.  Pulmonary:     Effort: Pulmonary effort is normal. No respiratory distress.     Breath sounds: Normal breath sounds.  Abdominal:     General: Bowel sounds are normal.     Palpations: Abdomen is soft.     Tenderness: There is  no abdominal tenderness. There is no guarding or rebound.  Skin:    General: Skin is warm and dry.  Neurological:     General: No focal deficit present.     Mental Status: She is alert and oriented to person, place, and time.     Cranial Nerves: No cranial nerve deficit.     Sensory: No sensory deficit.     Motor: No weakness.     Coordination: Coordination normal.     Gait: Gait normal.     Deep Tendon Reflexes: Reflexes normal.  Psychiatric:        Mood and Affect: Mood normal.        Behavior: Behavior normal.      UC Treatments / Results  Labs (all labs ordered are listed, but only abnormal results are displayed) Labs Reviewed - No data to display  EKG   Radiology No results found.  Procedures Procedures (including critical care time)  Medications Ordered in UC Medications - No data to display  Initial Impression / Assessment and Plan / UC Course  I have reviewed the triage vital signs and the nursing notes.  Pertinent labs & imaging results that were available during my care of the patient were reviewed by me and considered in my medical decision making (see chart for details).    Dizziness.  Elevated blood pressure with known hypertension.  Metoprolol recently discontinued and patient has been taking OTC cold/sinus medication.  Instructed patient to stop taking all OTC cold/sinus medications, except she may take Coricidin HBP.  Treating dizziness with meclizine; precautions for drowsiness discussed.  Instructed patient to follow-up with her PCP tomorrow or Monday to discuss her elevated blood pressure.  Patient agrees to plan of care.     Final Clinical Impressions(s) / UC Diagnoses   Final diagnoses:   Dizziness  Elevated blood pressure reading in office with diagnosis of hypertension     Discharge Instructions     Stop taking all over-the-counter cold/sinus medications such as Sudafed and TheraFlu as they may cause your blood pressure to rise.  You can take Coricidin HBP if needed.    Take the medication meclizine as directed for your dizziness.  Do not drive a car, operate machinery, or drink alcohol with this medication as it may cause drowsiness.    Your blood pressure is elevated today at 158/105.  Please have this rechecked by your primary care provider in 2 weeks.          ED Prescriptions    Medication Sig Dispense Auth. Provider   meclizine (ANTIVERT) 12.5 MG tablet Take 1 tablet (12.5 mg total) by mouth 3 (three) times daily as needed for dizziness. 30 tablet Sharion Balloon, NP     PDMP not reviewed this encounter.   Sharion Balloon, NP 05/02/19 1944

## 2019-05-02 NOTE — Discharge Instructions (Signed)
Stop taking all over-the-counter cold/sinus medications such as Sudafed and TheraFlu as they may cause your blood pressure to rise.  You can take Coricidin HBP if needed.    Take the medication meclizine as directed for your dizziness.  Do not drive a car, operate machinery, or drink alcohol with this medication as it may cause drowsiness.    Your blood pressure is elevated today at 158/105.  Please have this rechecked by your primary care provider in 2 weeks.

## 2019-05-03 ENCOUNTER — Encounter: Payer: Self-pay | Admitting: Family Medicine

## 2019-05-03 NOTE — Telephone Encounter (Signed)
Copied from [place] [id number]. Topic: General - Other >> May 03, 2019  1:08 PM Carolyn Stare wrote: Pt call to say [facility] sent over paperwork and pt said the paperwork need to be sent back today so she can return to work on Monday 05/06/2019 >> May 03, 2019  3:02 PM Celene Kras A wrote: Pt called and is requesting an update. Please advise.

## 2019-05-05 NOTE — Telephone Encounter (Signed)
Okay back to work, full duty. See previous note.

## 2019-05-05 NOTE — Progress Notes (Signed)
Jessica Hale is a 43 y.o. female is here for follow up.  History of Present Illness:   HPI: HPI: 2 weeks ago, the patient presented via virtual visit with hypotension episodes.  Her metoprolol was held and she did very well.  Unfortunately, over the last week she had a sinus infection, took Sudafed, drink more alcohol and eat very salty foods while at the beach. Developed hypertension as well as headaches.  She has been treated for sinusitis at this point.  She is on half dose metoprolol.  Symptoms and blood pressures are improving.  There are no preventive care reminders to display for this patient. Depression screen [facility] 2/9 01/11/2019 08/17/2018 12/29/2017  Decreased Interest 3 0 0  Down, Depressed, Hopeless 3 0 0  PHQ - 2 Score 6 0 0  Altered sleeping 3 2 -  Tired, decreased energy 3 1 -  Change in appetite 3 0 -  Feeling bad or failure about yourself  3 0 -  Trouble concentrating 3 0 -  Moving slowly or fidgety/restless 0 1 -  Suicidal thoughts 0 0 -  PHQ-9 Score 21 4 -  Difficult doing work/chores Extremely dIfficult Not difficult at all -  Some recent data might be hidden   PMHx, SurgHx, SocialHx, FamHx, Medications, and Allergies were reviewed in the Visit Navigator and updated as appropriate.   Patient Active Problem List   Diagnosis Date Noted  . Nonallopathic lesion of cervical region 02/21/2019  . Nonallopathic lesion of thoracic region 02/21/2019  . Nonallopathic lesion of rib cage 02/21/2019  . Cervical radiculopathy 04/20/2018  . Chronic periscapular pain on right side 01/01/2018  . Hematochezia 09/06/2017  . Breast mass in female 03/20/2015  . Chronic right-sided low back pain without sciatica 08/01/2014  . Carpal tunnel syndrome on right 08/01/2014  . Palpitations 03/06/2014  . Meniere's disease (cochlear hydrops) 01/25/2013  . Allergic rhinitis 04/10/2012  . Atypical migraine 05/02/2011  . Papanicolaou smear of cervix with positive high risk human papilloma  virus (HPV) test 12/06/2007  . GERD 10/16/2007  . Benign essential HTN 02/07/2007  . Mild intermittent asthma 02/07/2007   Social History   Tobacco Use  . Smoking status: Current Every Day Smoker    Packs/day: 0.25    Years: 5.00    Pack years: 1.25    Types: Cigarettes  . Smokeless tobacco: Never Used  Substance Use Topics  . Alcohol use: Yes    Alcohol/week: 0.0 standard drinks    Comment: occasional  . Drug use: Not Currently    Types: Marijuana   Current Medications and Allergies   Current Outpatient Medications:  .  albuterol (VENTOLIN HFA) 108 (90 Base) MCG/ACT inhaler, Inhale 2 puffs into the lungs every 6 (six) hours as needed for wheezing or shortness of breath., Disp: 18 g, Rfl: 3 .  beclomethasone (QVAR REDIHALER) 40 MCG/ACT inhaler, Inhale 2 puffs into the lungs 2 (two) times daily., Disp: 10.6 g, Rfl: 4 .  EPIPEN 2-PAK 0.3 MG/0.3ML SOAJ injection, inject as directed WITH BEE STING, Disp: , Rfl: 0 .  hydrochlorothiazide (HYDRODIURIL) 25 MG tablet, TAKE 1/2 TABLET(12.5 MG) BY MOUTH DAILY, Disp: 90 tablet, Rfl: 3 .  meclizine (ANTIVERT) 12.5 MG tablet, Take 1 tablet (12.5 mg total) by mouth 3 (three) times daily as needed for dizziness., Disp: 30 tablet, Rfl: 0 .  metoprolol tartrate (LOPRESSOR) 25 MG tablet, , Disp: , Rfl:  .  naproxen (NAPROSYN) 500 MG tablet, , Disp: , Rfl:  .  omeprazole (PRILOSEC) 40 MG capsule, TAKE 1 CAPSULE(40 MG) BY MOUTH DAILY, Disp: 90 capsule, Rfl: 3 .  zolpidem (AMBIEN) 5 MG tablet, TAKE 1 TABLET(5 MG) BY MOUTH AT BEDTIME AS NEEDED FOR SLEEP, Disp: 30 tablet, Rfl: 3  Allergies  Allergen Reactions  . Bee Venom Anaphylaxis   Review of Systems   Pertinent items are noted in the HPI. Otherwise, a complete ROS is negative.  Vitals   Vitals:   05/06/19 1332  BP: (!) 160/90  Pulse: 89  Temp: 98.2 F (36.8 C)  TempSrc: Temporal  SpO2: 100%  Weight: 153 lb 12.8 oz (69.8 kg)  Height: 5\' 2"  (1.575 m)     Body mass index is 28.13  kg/m.  Physical Exam   Physical Exam Vitals signs and nursing note reviewed.  HENT:     Head: Normocephalic and atraumatic.  Eyes:     Pupils: Pupils are equal, round, and reactive to light.  Neck:     Musculoskeletal: Normal range of motion and neck supple.  Cardiovascular:     Rate and Rhythm: Normal rate and regular rhythm.     Heart sounds: Normal heart sounds.  Pulmonary:     Effort: Pulmonary effort is normal.  Abdominal:     Palpations: Abdomen is soft.  Skin:    General: Skin is warm.  Psychiatric:        Behavior: Behavior normal.    Assessment and Plan   Jessica Hale was seen today for follow-up.  Diagnoses and all orders for this visit:  Benign essential HTN  Vertigo -     CBC with Differential/Platelet -     Comprehensive metabolic panel -     Magnesium -     TSH  Screening for lipid disorders -     Lipid panel    . Orders and follow up as documented in Epic[facility], reviewed diet, exercise and weight control, cardiovascular risk and specific lipid/LDL goals reviewed, reviewed medications and side effects in detail.  . Reviewed expectations re: course of current medical issues. . Outlined signs and symptoms indicating need for more acute intervention. . Patient verbalized understanding and all questions were answered. . Patient received an After Visit Summary.  Marcelle Smiling, DO [place], [place] 05/06/2019

## 2019-05-06 ENCOUNTER — Encounter: Payer: Self-pay | Admitting: Family Medicine

## 2019-05-06 ENCOUNTER — Ambulatory Visit: Payer: 59 | Admitting: Family Medicine

## 2019-05-06 ENCOUNTER — Other Ambulatory Visit: Payer: Self-pay

## 2019-05-06 VITALS — BP 160/90 | HR 89 | Temp 98.2°F | Ht 62.0 in | Wt 153.8 lb

## 2019-05-06 DIAGNOSIS — Z13220 Encounter for screening for lipoid disorders: Secondary | ICD-10-CM

## 2019-05-06 DIAGNOSIS — R42 Dizziness and giddiness: Secondary | ICD-10-CM | POA: Diagnosis not present

## 2019-05-06 DIAGNOSIS — Z23 Encounter for immunization: Secondary | ICD-10-CM

## 2019-05-06 DIAGNOSIS — I10 Essential (primary) hypertension: Secondary | ICD-10-CM

## 2019-05-06 LAB — CBC WITH DIFFERENTIAL/PLATELET
Basophils Absolute: 0.1 10*3/uL (ref 0.0–0.1)
Basophils Relative: 1.6 % (ref 0.0–3.0)
Eosinophils Absolute: 0.3 10*3/uL (ref 0.0–0.7)
Eosinophils Relative: 6 % — ABNORMAL HIGH (ref 0.0–5.0)
HCT: 33.1 % — ABNORMAL LOW (ref 36.0–46.0)
Hemoglobin: 10.6 g/dL — ABNORMAL LOW (ref 12.0–15.0)
Lymphocytes Relative: 42.1 % (ref 12.0–46.0)
Lymphs Abs: 1.9 10*3/uL (ref 0.7–4.0)
MCHC: 31.9 g/dL (ref 30.0–36.0)
MCV: 81.1 fl (ref 78.0–100.0)
Monocytes Absolute: 0.3 10*3/uL (ref 0.1–1.0)
Monocytes Relative: 6.6 % (ref 3.0–12.0)
Neutro Abs: 2 10*3/uL (ref 1.4–7.7)
Neutrophils Relative %: 43.7 % (ref 43.0–77.0)
Platelets: 382 10*3/uL (ref 150.0–400.0)
RBC: 4.08 Mil/uL (ref 3.87–5.11)
RDW: 16.4 % — ABNORMAL HIGH (ref 11.5–15.5)
WBC: 4.6 10*3/uL (ref 4.0–10.5)

## 2019-05-06 LAB — COMPREHENSIVE METABOLIC PANEL
ALT: 12 U/L (ref 0–35)
AST: 15 U/L (ref 0–37)
Albumin: 4.5 g/dL (ref 3.5–5.2)
Alkaline Phosphatase: 39 U/L (ref 39–117)
BUN: 8 mg/dL (ref 6–23)
CO2: 24 mEq/L (ref 19–32)
Calcium: 9.9 mg/dL (ref 8.4–10.5)
Chloride: 106 mEq/L (ref 96–112)
Creatinine, Ser: 0.84 mg/dL (ref 0.40–1.20)
GFR: 90.41 mL/min (ref >60.00)
Glucose, Bld: 92 mg/dL (ref 70–99)
Potassium: 3.8 mEq/L (ref 3.5–5.1)
Sodium: 137 mEq/L (ref 135–145)
Total Bilirubin: 0.2 mg/dL (ref 0.2–1.2)
Total Protein: 7.1 g/dL (ref 6.0–8.3)

## 2019-05-06 LAB — MAGNESIUM: Magnesium: 1.7 mg/dL (ref 1.5–2.5)

## 2019-05-06 LAB — LIPID PANEL
Cholesterol: 177 mg/dL (ref 0–200)
HDL: 59.4 mg/dL (ref >39.00)
LDL Cholesterol: 103 mg/dL — ABNORMAL HIGH (ref 0–99)
NonHDL: 117.41
Total CHOL/HDL Ratio: 3
Triglycerides: 71 mg/dL (ref 0.0–149.0)
VLDL: 14.2 mg/dL (ref 0.0–40.0)

## 2019-05-06 LAB — TSH: TSH: 2.09 u[IU]/mL (ref 0.35–4.50)

## 2019-05-06 NOTE — Telephone Encounter (Signed)
Patient in office today form filled out as requested and faxed. Copy given to patient for her records.

## 2019-05-06 NOTE — Telephone Encounter (Signed)
Form filled out pt in office today given copy for records and faxed to office.

## 2019-05-06 NOTE — Patient Instructions (Signed)
Go back to a who.e tab of Metoprolol until your blood pressure starts to go down. Then decrease to 1/2 tab, then off if BP gets too low.   I will miss you!   Here is my email: [email or web address]. I'm on FB - [employer].

## 2019-05-07 ENCOUNTER — Telehealth: Payer: Self-pay

## 2019-05-07 MED ORDER — PULMICORT FLEXHALER 90 MCG/ACT IN AEPB: 1.0000 | INHALATION_SPRAY | Freq: Two times a day (BID) | RESPIRATORY_TRACT | 6 refills | Status: DC

## 2019-05-07 NOTE — Telephone Encounter (Signed)
See if pulmicort covered.

## 2019-05-07 NOTE — Telephone Encounter (Signed)
Called patient let her know we will send in script for Pulmicort and see if covered.

## 2019-05-07 NOTE — Telephone Encounter (Signed)
Qvar not covered by insurance any thing that she can change to?

## 2019-05-10 ENCOUNTER — Ambulatory Visit: Payer: 59 | Admitting: Neurology

## 2019-05-14 ENCOUNTER — Ambulatory Visit: Payer: Self-pay | Admitting: *Deleted

## 2019-05-14 ENCOUNTER — Ambulatory Visit (INDEPENDENT_AMBULATORY_CARE_PROVIDER_SITE_OTHER): Payer: 59 | Admitting: Family Medicine

## 2019-05-14 VITALS — BP 138/102

## 2019-05-14 DIAGNOSIS — I10 Essential (primary) hypertension: Secondary | ICD-10-CM

## 2019-05-14 DIAGNOSIS — H8103 Meniere's disease, bilateral: Secondary | ICD-10-CM | POA: Diagnosis not present

## 2019-05-14 DIAGNOSIS — R42 Dizziness and giddiness: Secondary | ICD-10-CM

## 2019-05-14 MED ORDER — HYDROCHLOROTHIAZIDE 25 MG PO TABS: 25.0000 mg | ORAL_TABLET | Freq: Every day | ORAL | 3 refills | Status: DC

## 2019-05-14 NOTE — Progress Notes (Signed)
° ° °  Chief Complaint:  Jessica Hale is a 43 y.o. female who presents today for a virtual office visit with a chief complaint of dizziness.   Assessment/Plan:  Dizziness No red flags.  Reassuring that symptoms seem to be improving with meclizine.  It is likely that she had a flare of her Mnire's disease given her vertiginous symptoms.  Recommended that she continue taking meclizine.  We will also increase her dose of HCTZ as I think this could help some with her Mnire's disease as well-see below problem.  Essential hypertension Blood pressure 138/102 today.  We will increase HCTZ to 25 mg daily as I think this will also help with her Mnire's disease.  She will continue taking metoprolol 12.5 mg twice daily.  She will be transitioning to a new PCP next week and will follow up in office then.  Discussed home blood pressure monitoring with goal 140/90 or lower.    Subjective:  HPI:  Dizziness Symptoms started about a month ago.  She went to urgent care due to concerns for possible sinus infection.  While there she was noted to have an elevated blood pressure reading into the 170s.  She was told to follow-up with her PCP for blood pressure management.  There was also concern about possible vertigo when she was started on meclizine.  She followed up with her PCP 2 weeks ago.  Her PCP then told her to start metoprolol as needed for elevated blood pressure readings.  She is currently taking HCTZ 12.5 mg daily.  Yesterday she felt very dizzy at work and had her blood pressure checked and was found to be in the 170s.  She took meclizine and a dose of metoprolol and has felt much better for the last day or so.  Currently still has mild dizziness but is significantly improved.  Patient describes her dizziness as a spinning sensation.  She has a history of Mnire's disease.  No reported chest pain or shortness of breath.  No reported weakness or numbness.  She thinks she has had some associated sinus  drainage and pressure but is not taking anything for that.  No other treatments tried.  No other obvious aggravating or alleviating factors.   ROS: Per HPI  PMH: She reports that she has been smoking cigarettes. She has a 1.25 pack-year smoking history. She has never used smokeless tobacco. She reports current alcohol use. She reports previous drug use. Drug: Marijuana.      Objective/Observations  Physical Exam: Gen: NAD, resting comfortably Pulm: Normal work of breathing Neuro: Grossly normal, moves all extremities Psych: Normal affect and thought content  No results found for this or any previous visit (from the past 24 hour(s)).   Virtual Visit via Video   I connected with Jessica Hale on 05/14/19 at 10:40 AM EDT by a video enabled telemedicine application and verified that I am speaking with the correct person using two identifiers. I discussed the limitations of evaluation and management by telemedicine and the availability of in person appointments. The patient expressed understanding and agreed to proceed.   Patient location: Home Provider location: [place] participating in the virtual visit: Myself and Patient     Algis Greenhouse. Jerline Pain, MD 05/14/2019 10:59 AM

## 2019-05-14 NOTE — Telephone Encounter (Signed)
Appt scheduled patient seen.

## 2019-05-14 NOTE — Telephone Encounter (Signed)
Pt called in concerned about her BP being elevated.   Dr. Juleen China made medication adjustment in her BP medications last week.   She is having dizziness, daily headaches. She is transferring to Dr. Tamela Oddi care since Dr. Juleen China has left the practice.   Pt was agreeable to doing a virtual visit if possible.  "I don't feel comfortable driving and I don't have anyone to bring me in until Thursday".    I warm transferred the call into Dr. Tamela Oddi office to [facility] to be scheduled.  I went over the care advice.  She verbalized understanding.   I went over the s/s to be aware of to go to the ED if they occur.  I sent my notes to Dr. Tamela Oddi office   Reason for Disposition . [1] Taking BP medications AND [2] feels is having side effects (e.g., impotence, cough, dizzy upon standing)    Having dizziness also as well as elevated BP.  Dr. Juleen China made adjustments in BP medications.  Answer Assessment - Initial Assessment Questions 1. BLOOD PRESSURE: "What is the blood pressure?" "Did you take at least two measurements 5 minutes apart?"     172/102 yesterday they sent me home from work because I was so dizzy.   So I came home and took the meclizine and laid down.    Dr. Juleen China adjusted my BP medications due to me losing a lot of weight. 135/102 2. ONSET: "When did you take your blood pressure?"     This morning 135/102.    The dizziness is not as bad as yesterday.   My iron was low so I've been on iron since last week per Dr. Juleen China. 3. HOW: "How did you obtain the blood pressure?" (e.g., visiting nurse, automatic home BP monitor)     Automatic BP cuff on upper arm. 4. HISTORY: "Do you have a history of high blood pressure?"     Yes 5. MEDICATIONS: "Are you taking any medications for blood pressure?" "Have you missed any doses recently?"     Yes 6. OTHER SYMPTOMS: "Do you have any symptoms?" (e.g., headache, chest pain, blurred vision, difficulty breathing, weakness)     Dizziness, headache every day,  visual is like a haze over my eyes. 7. PREGNANCY: "Is there any chance you are pregnant?" "When was your last menstrual period?"     No  Protocols used: HIGH BLOOD PRESSURE-A-AH

## 2019-05-21 ENCOUNTER — Other Ambulatory Visit: Payer: Self-pay

## 2019-05-21 ENCOUNTER — Ambulatory Visit: Payer: 59 | Admitting: Family Medicine

## 2019-05-21 ENCOUNTER — Encounter: Payer: Self-pay | Admitting: Family Medicine

## 2019-05-21 VITALS — BP 142/80 | HR 86 | Temp 97.9°F | Resp 16 | Ht 62.0 in | Wt 156.4 lb

## 2019-05-21 DIAGNOSIS — H8109 Meniere's disease, unspecified ear: Secondary | ICD-10-CM | POA: Diagnosis not present

## 2019-05-21 DIAGNOSIS — I10 Essential (primary) hypertension: Secondary | ICD-10-CM | POA: Diagnosis not present

## 2019-05-21 DIAGNOSIS — D649 Anemia, unspecified: Secondary | ICD-10-CM

## 2019-05-21 MED ORDER — METOPROLOL SUCCINATE ER 25 MG PO TB24: 25.0000 mg | ORAL_TABLET | Freq: Every day | ORAL | 3 refills | Status: DC

## 2019-05-21 NOTE — Patient Instructions (Signed)
Please return as scheduled.  We will call you with information regarding your referral appointment. ENT for Meniere's disease evaluation and treatment.  If you do not hear from [place] within the next 2 weeks, please let me know. It can take 1-2 weeks to get appointments set up with the specialists.   Use the meclizine as needed for dizziness.  Take the new Toprol xl 25mg  daily with the HCTZ for your blood pressure. We will recheck it with labwork in December.   It was a pleasure meeting you today! Thank you for choosing [place] to meet your healthcare needs! I truly look forward to working with you. If you have any questions or concerns, please send me a message via Mychart or call the office at [phone].   Meniere Disease  Meniere disease is an inner ear disorder. It causes attacks of a spinning sensation (vertigo), dizziness, and ringing in the ear (tinnitus). It also causes hearing loss and a feeling of fullness or pressure in the ear. This is a lifelong condition, and it may get worse over time. You may have drop attacks or severe dizziness that makes you fall. A drop attack is when you suddenly fall without losing consciousness and you quickly recover after a few seconds or minutes. What are the causes? This condition is caused by having too much of the fluid that is in your inner ear (endolymph). When fluid builds up in your inner ear, it affects the nerves that control balance and hearing. The reason for the fluid buildup is not known. Possible causes include:  Allergies.  An abnormal reaction of the body's defense system (autoimmune disease).  Viral infection of the inner ear.  Head injury. What increases the risk? You are more likely to develop this condition if:  You are older than age 52.  You have a family history of Meniere disease.  You have a history of autoimmune disease.  You have a history of migraine headaches. What are the signs or symptoms? Symptoms of this  condition can come and go and may last for up to 4 hours at a time. Symptoms usually start in one ear. They may become more frequent and eventually involve both ears. Symptoms can include:  Fullness and pressure in your ear.  Roaring or ringing in your ear.  Vertigo and loss of balance.  Dizziness.  Decreased hearing.  Nausea and vomiting. How is this diagnosed? This condition is diagnosed based on:  A physical exam.  Tests , such as: ? A hearing test (audiogram). ? An electronystagmogram. This tests your balance nerve (vestibular nerve). ? Imaging studies of your inner ear, such as CT scan or MRI. ? Other balance tests, such as rotational or balance platform tests. How is this treated? There is no cure for this condition, but treatment can help to manage your symptoms. Treatment may include:  A low-salt diet. Limiting salt may help to reduce fluid in the body and relieve symptoms.  Oral or injected medicines to reduce or control: ? Vertigo. ? Nausea. ? Fluid retention. ? Dizziness.  Use of an air pressure pulse generator. This is a machine that sends small pressure pulses into your ear canal.  Hearing aids.  Inner ear surgery. This is rare. When you have symptoms, it can be helpful to lie down on a flat surface and focus your eyes on one object that does not move. Try to stay in that position until your symptoms go away. Follow these instructions at home: Eating  and drinking  Eat the same amount of food at the same time every day, including snacks.  Do not skip meals.  Avoid caffeine.  Drink enough fluids to keep your urine clear or pale yellow.  Limit alcoholic drinks to one drink a day for non-pregnant women and 2 drinks a day for men. One drink equals 12 oz of beer, 5 oz of wine, or 1 oz of hard liquor.  Limit the salt (sodium) in your diet as told by your health care provider. Check ingredients and nutrition facts on packaged foods and beverages.  Do not  eat foods that contain monosodium glutamate (MSG). General instructions  Do not use any products that contain nicotine or tobacco, such as cigarettes and e-cigarettes. If you need help quitting, ask your health care provider.  Take over-the-counter and prescription medicines only as told by your health care provider.  Find ways to reduce or avoid stress. If you need help with this, ask your health care provider.  Do not drive if you have vertigo or dizziness. Contact a health care provider if:  You have symptoms that last longer than 4 hours.  You have new or worse symptoms. Get help right away if:  You have been vomiting for 24 hours.  You cannot keep fluids down.  You have chest pain or trouble breathing. Summary  Meniere disease is an inner ear disorder. It causes attacks of a spinning sensation (vertigo), dizziness, and ringing in the ear (tinnitus). It also causes hearing loss and a feeling of fullness or pressure in the ear.  Symptoms of this condition can come and go and may last for up to 4 hours at a time.  When you have symptoms, it can be helpful to lie down on a flat surface and focus your eyes on one object that does not move. Try to stay in that position until your symptoms go away. This information is not intended to replace advice given to you by your health care provider. Make sure you discuss any questions you have with your health care provider. Document Released: 07/01/2000 Document Revised: 06/16/2017 Document Reviewed: 05/25/2016 Elsevier Patient Education  2020 [employer].

## 2019-05-21 NOTE — Progress Notes (Signed)
Subjective  CC:  Chief Complaint  Patient presents with  . Transitions Of Care  . Hypertension    Has been going on for 1 month with dizziness, headaches, and blurred vision    HPI: Jessica Hale is a 43 y.o. female who presents to the office today to address the problems listed above in the chief complaint.  Hypertension f/u: Control is fair . Pt reports she is doing well. Taking metoprolol 12.5bid with hctz 25. Getting variable readings; elevated when checks with associated dizziness. . She denies adverse effects from his BP medications. Compliance with medication is good.   Vertigo: ongoing x 6 weeks. Reviewed chart. Prior PCP thought maybe related to hypotension so cut back on BP meds, but then bp elevated and hctz dose was increased. H/o meniere's disease dxd about 8 years ago. Hasn't had flares in years. Describes classic vertigo w/o other neurologic deficits with ear fullness, mild tinnitus bilaterally and vertigo w/o head motion or movement. No n/v or vision changes. On hctz and meclizine low dose as needed which helps.   Assessment  1. Cochlear hydrops, unspecified laterality   2. Benign essential HTN   3. Anemia, normocytic normochromic      Plan   Refer to ENT for meniere's eval and treatment recs. No change to meds now. Continue hctz and meclizine. No red flags.   Hypertension f/u: BP control is fairly well controlled. Suspect some elevated readings due to vertigo/stress response. Change to once daily toprol xl 25 and recheck in 1 month at f/u visit.   Anemia f/u: reviewed chart. Normocytic anemia thought iron deficient due to menses; now on iron supplements. To recheck at f/u visit. Continue iron bid.  Education regarding management of these chronic disease states was given. Management strategies discussed on successive visits include dietary and exercise recommendations, goals of achieving and maintaining IBW, and lifestyle modifications aiming for adequate sleep and  minimizing stressors.   Follow up: No follow-ups on file.  Orders Placed This Encounter  Procedures  . Ambulatory referral to ENT   Meds ordered this encounter  Medications  . metoprolol succinate (TOPROL-XL) 25 MG 24 hr tablet    Sig: Take 1 tablet (25 mg total) by mouth daily.    Dispense:  90 tablet    Refill:  3      BP Readings from Last 3 Encounters:  05/21/19 (!) 142/80  05/14/19 (!) 138/102  05/06/19 (!) 160/90   Wt Readings from Last 3 Encounters:  05/21/19 156 lb 6.4 oz (70.9 kg)  05/06/19 153 lb 12.8 oz (69.8 kg)  04/10/19 150 lb (68 kg)    Lab Results  Component Value Date   CHOL 177 05/06/2019   CHOL 185 12/29/2017   CHOL 177 09/17/2015   Lab Results  Component Value Date   HDL 59.40 05/06/2019   HDL 53.40 12/29/2017   HDL 51 09/17/2015   Lab Results  Component Value Date   LDLCALC 103 (H) 05/06/2019   LDLCALC 108 (H) 12/29/2017   LDLCALC 102 09/17/2015   Lab Results  Component Value Date   TRIG 71.0 05/06/2019   TRIG 114.0 12/29/2017   TRIG 122 09/17/2015   Lab Results  Component Value Date   CHOLHDL 3 05/06/2019   CHOLHDL 3 12/29/2017   CHOLHDL 3.5 09/17/2015   No results found for: LDLDIRECT Lab Results  Component Value Date   CREATININE 0.84 05/06/2019   BUN 8 05/06/2019   NA 137 05/06/2019   K 3.8  05/06/2019   CL 106 05/06/2019   CO2 24 05/06/2019    The 10-year ASCVD risk score Mikey Bussing DC Jr., et al., 2013) is: 4.2%   Values used to calculate the score:     Age: 32 years     Sex: Female     Is Non-Hispanic African American: Yes     Diabetic: No     Tobacco smoker: Yes     Systolic Blood Pressure: 536 mmHg     Is BP treated: Yes     HDL Cholesterol: 59.4 mg/dL     Total Cholesterol: 177 mg/dL  I reviewed the patients updated PMH, FH, and SocHx.    Patient Active Problem List   Diagnosis Date Noted  . Cervical radiculopathy 04/20/2018    Priority: High  . Meniere's disease (cochlear hydrops) 01/25/2013    Priority:  High  . Atypical migraine 05/02/2011    Priority: High  . Benign essential HTN 02/07/2007    Priority: High  . Carpal tunnel syndrome on right 08/01/2014    Priority: Medium  . GERD 10/16/2007    Priority: Medium  . Mild intermittent asthma 02/07/2007    Priority: Medium  . Chronic periscapular pain on right side 01/01/2018    Priority: Low  . Chronic right-sided low back pain without sciatica 08/01/2014    Priority: Low  . Allergic rhinitis 04/10/2012    Priority: Low  . Papanicolaou smear of cervix with positive high risk human papilloma virus (HPV) test 12/06/2007    Allergies: Bee venom  Social History: Patient  reports that she has been smoking cigarettes. She has a 1.25 pack-year smoking history. She has never used smokeless tobacco. She reports current alcohol use. She reports previous drug use. Drug: Marijuana.  Current Meds  Medication Sig  . albuterol (VENTOLIN HFA) 108 (90 Base) MCG/ACT inhaler Inhale 2 puffs into the lungs every 6 (six) hours as needed for wheezing or shortness of breath.  . Budesonide (PULMICORT FLEXHALER) 90 MCG/ACT inhaler Inhale 1 puff into the lungs 2 (two) times daily.  . hydrochlorothiazide (HYDRODIURIL) 25 MG tablet Take 1 tablet (25 mg total) by mouth daily.  . meclizine (ANTIVERT) 12.5 MG tablet Take 1 tablet (12.5 mg total) by mouth 3 (three) times daily as needed for dizziness.  Marland Kitchen omeprazole (PRILOSEC) 40 MG capsule TAKE 1 CAPSULE(40 MG) BY MOUTH DAILY  . zolpidem (AMBIEN) 5 MG tablet TAKE 1 TABLET(5 MG) BY MOUTH AT BEDTIME AS NEEDED FOR SLEEP  . [DISCONTINUED] metoprolol tartrate (LOPRESSOR) 25 MG tablet     Review of Systems: Cardiovascular: negative for chest pain, palpitations, leg swelling, orthopnea. NO LIGHTHEADEDNESS Respiratory: negative for SOB, wheezing or persistent cough Gastrointestinal: negative for abdominal pain Genitourinary: negative for dysuria or gross hematuria  Objective  Vitals: BP (!) 142/80   Pulse 86    Temp 97.9 F (36.6 C) (Tympanic)   Resp 16   Ht 5\' 2"  (1.575 m)   Wt 156 lb 6.4 oz (70.9 kg)   LMP 05/19/2019   SpO2 98%   BMI 28.61 kg/m  General: no acute distress  Psych:  Alert and oriented, normal mood and affect HEENT:  Normocephalic, atraumatic, supple neck  Cardiovascular:  RRR without murmur. no edema Respiratory:  Good breath sounds bilaterally, CTAB with normal respiratory effort Skin:  Warm, no rashes Neurologic:   Mental status is normal, nonfocal exam  Commons side effects, risks, benefits, and alternatives for medications and treatment plan prescribed today were discussed, and the patient expressed understanding  of the given instructions. Patient is instructed to call or message via MyChart if he/she has any questions or concerns regarding our treatment plan. No barriers to understanding were identified. We discussed Red Flag symptoms and signs in detail. Patient expressed understanding regarding what to do in case of urgent or emergency type symptoms.   Medication list was reconciled, printed and provided to the patient in AVS. Patient instructions and summary information was reviewed with the patient as documented in the AVS. This note was prepared with assistance of Dragon voice recognition software. Occasional wrong-word or sound-a-like substitutions may have occurred due to the inherent limitations of voice recognition software

## 2019-05-29 ENCOUNTER — Encounter: Payer: Self-pay | Admitting: Family Medicine

## 2019-05-29 MED ORDER — MECLIZINE HCL 12.5 MG PO TABS: 12.5000 mg | ORAL_TABLET | Freq: Three times a day (TID) | ORAL | 2 refills | Status: DC | PRN

## 2019-05-30 ENCOUNTER — Encounter: Payer: Self-pay | Admitting: Family Medicine

## 2019-05-30 ENCOUNTER — Other Ambulatory Visit: Payer: Self-pay

## 2019-05-30 ENCOUNTER — Encounter (INDEPENDENT_AMBULATORY_CARE_PROVIDER_SITE_OTHER): Payer: Self-pay | Admitting: Otolaryngology

## 2019-05-30 ENCOUNTER — Ambulatory Visit (INDEPENDENT_AMBULATORY_CARE_PROVIDER_SITE_OTHER): Payer: 59 | Admitting: Otolaryngology

## 2019-05-30 VITALS — Temp 98.1°F

## 2019-05-30 DIAGNOSIS — R42 Dizziness and giddiness: Secondary | ICD-10-CM | POA: Diagnosis not present

## 2019-05-30 DIAGNOSIS — H8103 Meniere's disease, bilateral: Secondary | ICD-10-CM

## 2019-05-30 NOTE — Progress Notes (Signed)
HPI: Jessica Hale is a 43 y.o. female who presents is referred by Billey Chang for evaluation of dizziness.  This initially began about 2 months ago with sensation of fullness in her head like she might pass out.  She was a little off balance.  She has some slight sensation of movement or spinning sensation.  This apparently persisted for several days and she initially emergently went to urgent care where they thought this was blood pressure related.  They changed her blood pressure medication but this did not seem to help that much.  She was subsequently prescribed meclizine and this seemed to help the most.  She has continued to use the meclizine intermittently.  She has occasionally had headaches.  She has not noted any hearing problems. She still occasionally uses meclizine which seems to help the most with her dizziness.  Past Medical History:  Diagnosis Date  . Anemia   . Asthma   . GERD (gastroesophageal reflux disease)   . Hypertension   . Migraine headache    Past Surgical History:  Procedure Laterality Date  . KNEE SURGERY     Age 26   Social History   Socioeconomic History  . Marital status: Married    Spouse name: Not on file  . Number of children: Not on file  . Years of education: Not on file  . Highest education level: Not on file  Occupational History  . Not on file  Social Needs  . Financial resource strain: Not on file  . Food insecurity    Worry: Not on file    Inability: Not on file  . Transportation needs    Medical: Not on file    Non-medical: Not on file  Tobacco Use  . Smoking status: Current Every Day Smoker    Packs/day: 0.50    Years: 7.00    Pack years: 3.50    Types: Cigarettes    Start date: 2012  . Smokeless tobacco: Never Used  Substance and Sexual Activity  . Alcohol use: Yes    Alcohol/week: 0.0 standard drinks    Comment: occasional  . Drug use: Not Currently    Types: Marijuana  . Sexual activity: Yes    Partners: Female     Comment: Lesbian  Lifestyle  . Physical activity    Days per week: 4 days    Minutes per session: Not on file  . Stress: Not on file  Relationships  . Social Herbalist on phone: Not on file    Gets together: Not on file    Attends religious service: Not on file    Active member of club or organization: Not on file    Attends meetings of clubs or organizations: Not on file    Relationship status: Not on file  Other Topics Concern  . Not on file  Social History Narrative  . Not on file   Family History  Problem Relation Age of Onset  . Heart attack Maternal Grandmother   . Hypertension Maternal Grandmother   . Lung cancer Maternal Aunt   . Colon cancer Neg Hx    Allergies  Allergen Reactions  . Bee Venom Anaphylaxis   Prior to Admission medications   Medication Sig Start Date End Date Taking? Authorizing Provider  albuterol (VENTOLIN HFA) 108 (90 Base) MCG/ACT inhaler Inhale 2 puffs into the lungs every 6 (six) hours as needed for wheezing or shortness of breath. 04/11/19  Yes Briscoe Deutscher, DO  Budesonide (PULMICORT FLEXHALER) 90 MCG/ACT inhaler Inhale 1 puff into the lungs 2 (two) times daily. 05/07/19  Yes Helane Rima, DO  EPIPEN 2-PAK 0.3 MG/0.3ML SOAJ injection inject as directed WITH BEE STING 12/25/14  Yes [provider]  hydrochlorothiazide (HYDRODIURIL) 25 MG tablet Take 1 tablet (25 mg total) by mouth daily. 05/14/19  Yes Ardith Dark, MD  meclizine (ANTIVERT) 12.5 MG tablet Take 1 tablet (12.5 mg total) by mouth 3 (three) times daily as needed for dizziness. 05/29/19  Yes Willow Ora, MD  metoprolol succinate (TOPROL-XL) 25 MG 24 hr tablet Take 1 tablet (25 mg total) by mouth daily. 05/21/19  Yes Willow Ora, MD  omeprazole (PRILOSEC) 40 MG capsule TAKE 1 CAPSULE(40 MG) BY MOUTH DAILY 04/11/19  Yes Helane Rima, DO  zolpidem (AMBIEN) 5 MG tablet TAKE 1 TABLET(5 MG) BY MOUTH AT BEDTIME AS NEEDED FOR SLEEP 02/26/19  Yes Helane Rima, DO      Positive ROS: No respiratory or breathing problems.  All other systems have been reviewed and were otherwise negative with the exception of those mentioned in the HPI and as above.  Physical Exam: General: Alert, no acute distress Ears: External ears are normal to appearance.  Ear canals are clear bilaterally with intact, clear TMs.  Hearing screening with a tuning forks revealed good hearing in both ears. Dix-Hallpike testing revealed no evidence of BPV. Nasal: External nose is normal.  Mild rhinitis.  Slight septal deviation to the right. Oral:  Dentition and gums are normal to appearance. Clear oropharynx.  Normal appearing tonsils. Neck: No palpable adenopathy or masses.  No carotid bruits Lungs: Clear to auscultation bilaterally Cardiac: Regular rate and rhythm without murmur  Procedures  Assessment: Dizziness questionable etiology.  Plan: We will schedule patient for ENG and audiologic testing to evaluate inner ear function. She will follow-up following the ENG and audiologic testing.   Narda Bonds, MD   CC:

## 2019-06-11 ENCOUNTER — Ambulatory Visit (INDEPENDENT_AMBULATORY_CARE_PROVIDER_SITE_OTHER): Payer: 59 | Admitting: Otolaryngology

## 2019-06-17 ENCOUNTER — Encounter: Payer: Self-pay | Admitting: Neurology

## 2019-06-17 ENCOUNTER — Other Ambulatory Visit: Payer: Self-pay

## 2019-06-17 ENCOUNTER — Ambulatory Visit: Payer: 59 | Admitting: Neurology

## 2019-06-17 VITALS — BP 150/96 | HR 60 | Ht 62.0 in | Wt 161.0 lb

## 2019-06-17 DIAGNOSIS — G5601 Carpal tunnel syndrome, right upper limb: Secondary | ICD-10-CM

## 2019-06-17 DIAGNOSIS — R202 Paresthesia of skin: Secondary | ICD-10-CM | POA: Diagnosis not present

## 2019-06-17 NOTE — Progress Notes (Signed)
[facility] Clinic Note - Initial Visit   Date: 06/17/19  Jessica Hale MRN: [id number] DOB: 11-24-1975   Dear Dr. Earlene Plater:   Thank you for your kind referral of Jessica Hale for consultation of paresthesias. Although her history is well known to you, please allow [place] to reiterate it for the purpose of our medical record. The patient was accompanied to the clinic by self.   History of Present Illness: Jessica Hale is a 43 y.o. right-handed female with chronic pain presenting for evaluation of numbness/tingling of the arms and legs.  For the past several years, she has numbness/tinglnig in the right index and middle finger and tingling involving the right lower leg and foot.  Symptoms occur about about four times per month and lasts intermittently for three days.  No specific triggers.  Hot bath tends to help alleviate her symptoms.  NCS/EMG of the arms in 2019 showed very mild right CTS and mild left ulnar neuropathy.  She is using a wrist splint. She also complains of diffuse mid-back and low back pain.  She has been getting relief with ESI.  MRI cervical and lumbar spine does not show any nerve impingement.   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  She lives with her wife.  No children.    Out-side paper records, electronic medical record, and images have been reviewed where available and summarized as:  NCS/EMG of the arms 05/10/2018: 1. Right median neuropathy at or distal to the wrist, consistent with a clinical diagnosis of carpal tunnel syndrome.  Overall, these findings are very mild in degree electrically. 2. Left ulnar neuropathy with slowing across the elbow, purely demyelinating and type and mild in degree electrically.  MRI lumbar spine 04/02/2019: 1. Mild spondylosis at L4-L5.  No stenosis or impingement.  MRI cervical spine wo contrast 03/31/2018: 1. Small C5-6 and C6-7 disc protrusions with annular fissures. No fracture or malalignment.  2. Mild canal stenosis C5-6.  No neural foraminal narrowing.  Lab Results  Component Value Date   HGBA1C 5.5 04/20/2018   Lab Results  Component Value Date   VITAMINB12 672 12/29/2017   Lab Results  Component Value Date   TSH 2.09 05/06/2019   No results found for: ESRSEDRATE, POCTSEDRATE  Past Medical History:  Diagnosis Date  . Anemia   . Asthma   . GERD (gastroesophageal reflux disease)   . Hypertension   . Migraine headache     Past Surgical History:  Procedure Laterality Date  . KNEE SURGERY     Age 43     Medications:  Outpatient Encounter Medications as of 06/17/2019  Medication Sig Note  . albuterol (VENTOLIN HFA) 108 (90 Base) MCG/ACT inhaler Inhale 2 puffs into the lungs every 6 (six) hours as needed for wheezing or shortness of breath.   . Budesonide (PULMICORT FLEXHALER) 90 MCG/ACT inhaler Inhale 1 puff into the lungs 2 (two) times daily.   Marland Kitchen EPIPEN 2-PAK 0.3 MG/0.3ML SOAJ injection inject as directed WITH BEE STING 12/31/2014: Received from: External Pharmacy  . hydrochlorothiazide (HYDRODIURIL) 25 MG tablet Take 1 tablet (25 mg total) by mouth daily.   . meclizine (ANTIVERT) 12.5 MG tablet Take 1 tablet (12.5 mg total) by mouth 3 (three) times daily as needed for dizziness.   . metoprolol succinate (TOPROL-XL) 25 MG 24 hr tablet Take 1 tablet (25 mg total) by mouth daily.   Marland Kitchen omeprazole (PRILOSEC) 40 MG capsule TAKE 1 CAPSULE(40 MG) BY MOUTH DAILY   .  zolpidem (AMBIEN) 5 MG tablet TAKE 1 TABLET(5 MG) BY MOUTH AT BEDTIME AS NEEDED FOR SLEEP    No facility-administered encounter medications on file as of 06/17/2019.     Allergies:  Allergies  Allergen Reactions  . Bee Venom Anaphylaxis    Family History: Family History  Problem Relation Age of Onset  . Heart attack Maternal Grandmother   . Hypertension Maternal Grandmother   . Lung cancer Maternal Aunt   . Colon cancer Neg Hx     Social History: Social History   Tobacco Use  . Smoking  status: Current Every Day Smoker    Packs/day: 0.50    Years: 7.00    Pack years: 3.50    Types: Cigarettes    Start date: 2012  . Smokeless tobacco: Never Used  Substance Use Topics  . Alcohol use: Yes    Alcohol/week: 0.0 standard drinks    Comment: occasional  . Drug use: Not Currently    Types: Marijuana   Social History   Social History Narrative   Right handed   No children   One story home    Review of Systems:  CONSTITUTIONAL: No fevers, chills, night sweats, or weight loss.   EYES: No visual changes or eye pain ENT: No hearing changes.  No history of nose bleeds.   RESPIRATORY: No cough, wheezing and shortness of breath.   CARDIOVASCULAR: Negative for chest pain, and palpitations.   GI: Negative for abdominal discomfort, blood in stools or black stools.  No recent change in bowel habits.   GU:  No history of incontinence.   MUSCLOSKELETAL: No history of joint pain or swelling.  +myalgias.   SKIN: Negative for lesions, rash, and itching.   HEMATOLOGY/ONCOLOGY: Negative for prolonged bleeding, bruising easily, and swollen nodes.  No history of cancer.   ENDOCRINE: Negative for cold or heat intolerance, polydipsia or goiter.   PSYCH:  No depression or anxiety symptoms.   NEURO: As Above.   Vital Signs:  BP (!) 150/96   Pulse 60   Ht 5\' 2"  (1.575 m)   Wt 161 lb (73 kg)   LMP 05/19/2019   SpO2 98%   BMI 29.45 kg/m    General Medical Exam:   General:  Well appearing, comfortable.   Eyes/ENT: see cranial nerve examination.   Neck:   No carotid bruits. Respiratory:  Clear to auscultation, good air entry bilaterally.   Cardiac:  Regular rate and rhythm, no murmur.   Extremities:  No deformities, edema, or skin discoloration.  Skin:  No rashes or lesions.  Neurological Exam: MENTAL STATUS including orientation to time, place, person, recent and remote memory, attention span and concentration, language, and fund of knowledge is normal.  Speech is not  dysarthric.  CRANIAL NERVES: II:  No visual field defects.  III-IV-VI: Pupils equal round and reactive to light.  Normal conjugate, extra-ocular eye movements in all directions of gaze.  No nystagmus.  No ptosis.   V:  Normal facial sensation.    VII:  Normal facial symmetry and movements.   VIII:  Normal hearing and vestibular function.   IX-X:  Normal palatal movement.   XI:  Normal shoulder shrug and head rotation.   XII:  Normal tongue strength and range of motion, no deviation or fasciculation.  MOTOR:  No atrophy, fasciculations or abnormal movements.  No pronator drift.   Upper Extremity:  Right  Left  Deltoid  5/5   5/5   Biceps  5/5  5/5   Triceps  5/5   5/5   Infraspinatus 5/5  5/5  Medial pectoralis 5/5  5/5  Wrist extensors  5/5   5/5   Wrist flexors  5/5   5/5   Finger extensors  5/5   5/5   Finger flexors  5/5   5/5   Dorsal interossei  5/5   5/5   Abductor pollicis  5/5   5/5   Tone (Ashworth scale)  0  0   Lower Extremity:  Right  Left  Hip flexors  5/5   5/5   Hip extensors  5/5   5/5   Adductor 5/5  5/5  Abductor 5/5  5/5  Knee flexors  5/5   5/5   Knee extensors  5/5   5/5   Dorsiflexors  5/5   5/5   Plantarflexors  5/5   5/5   Toe extensors  5/5   5/5   Toe flexors  5/5   5/5   Tone (Ashworth scale)  0  0   MSRs:  Right        Left                  brachioradialis 2+  2+  biceps 2+  2+  triceps 2+  2+  patellar 2+  2+  ankle jerk 2+  2+  Hoffman no  no  plantar response down  down   SENSORY:  Normal and symmetric perception of light touch, pinprick, vibration, and proprioception.  Romberg's sign absent.   COORDINATION/GAIT: Normal finger-to- nose-finger and heel-to-shin.  Intact rapid alternating movements bilaterally.  Able to rise from a chair without using arms.  Gait narrow based and stable. Tandem and stressed gait intact.    IMPRESSION: 1. Bilateral thigh paresthesias, initially symptoms concerning for meralgia paresthetica, however  with onset occurring at the same time has her right hand symptoms, this would not be the case.  Normal exam makes CNS pathology very unlikely and distribution of symptoms does not fit large or small fiber neuropathy.  - NCS/EMG of the legs to check for radiculopathy  2.  Right carpal tunnel syndrome (mild)  - Stressed compliance with using wrist splint nightly  3.  Diffuse back pain, likely muscular strain vs fibromyalgia/chronic pain syndrome.  Patient informed that I do not manage these conditions and she may want to discuss this possibility with her PCP  Greater than 50% of this 45 minute visit was spent in counseling, explanation of diagnosis, planning of further management, and coordination of care.   Thank you for allowing me to participate in patient's care.  If I can answer any additional questions, I would be pleased to do so.    Sincerely,    Loistine Eberlin K. Allena KatzPatel, DO

## 2019-06-17 NOTE — Patient Instructions (Signed)

## 2019-06-21 ENCOUNTER — Other Ambulatory Visit: Payer: Self-pay

## 2019-06-24 ENCOUNTER — Ambulatory Visit (INDEPENDENT_AMBULATORY_CARE_PROVIDER_SITE_OTHER): Payer: 59 | Admitting: Family Medicine

## 2019-06-24 ENCOUNTER — Other Ambulatory Visit: Payer: Self-pay

## 2019-06-24 ENCOUNTER — Encounter: Payer: Self-pay | Admitting: Family Medicine

## 2019-06-24 VITALS — BP 130/84 | HR 70 | Temp 98.2°F | Ht 62.0 in | Wt 161.0 lb

## 2019-06-24 DIAGNOSIS — H8109 Meniere's disease, unspecified ear: Secondary | ICD-10-CM

## 2019-06-24 DIAGNOSIS — M5412 Radiculopathy, cervical region: Secondary | ICD-10-CM

## 2019-06-24 DIAGNOSIS — M797 Fibromyalgia: Secondary | ICD-10-CM

## 2019-06-24 DIAGNOSIS — I10 Essential (primary) hypertension: Secondary | ICD-10-CM

## 2019-06-24 MED ORDER — AMITRIPTYLINE HCL 25 MG PO TABS: 25.0000 mg | ORAL_TABLET | Freq: Every day | ORAL | 2 refills | Status: DC

## 2019-06-24 NOTE — Progress Notes (Signed)
Subjective  CC:  Chief Complaint  Patient presents with  . Back Pain  . Dizziness    HPI: Jessica Hale is a 43 y.o. female who presents to the office today to address the problems listed above in the chief complaint.  Meniere's: now seeing Dr. Benjamine Mola; has tilt table test pending. Feels good about this specialist. Dizziness has improved.   Chronic pain: c/o right sided body pain x 5 years; has seen ortho, neuro and PCP. I've reviewed notes. Has cervical DJD that has responded to steroids but wouldn't explain full right sided body pain. Recently evaluated by neuro and no neurologic disorder outside of carpal tunnel identified. Further work up pending.   ? Fibromyalgia  HTN is controlled with bb    Assessment  1. Fibromyalgia   2. Benign essential HTN   3. Cervical radiculopathy   4. Cochlear hydrops, unspecified laterality      Plan   Possible fibromyalgia:  Discussed dx and treatment options with pt. She is on board. Trial of nighttime elavil for sleep (poor sleep: stop ambien) and pain. Will f/u in 3 months. She will research dx as well. Consider gabapentin, cymbalta, doxepin and mm relaxers.   htn is controlled.   Neck pain per ortho  Meniere's per ent.   cpe is up to date.   Follow up: 3 months for recheck pain.  Visit date not found  No orders of the defined types were placed in this encounter.  Meds ordered this encounter  Medications  . amitriptyline (ELAVIL) 25 MG tablet    Sig: Take 1-2 tablets (25-50 mg total) by mouth at bedtime.    Dispense:  60 tablet    Refill:  2      I reviewed the patients updated PMH, FH, and SocHx.    Patient Active Problem List   Diagnosis Date Noted  . Cervical radiculopathy 04/20/2018    Priority: High  . Meniere's disease (cochlear hydrops) 01/25/2013    Priority: High  . Atypical migraine 05/02/2011    Priority: High  . Benign essential HTN 02/07/2007    Priority: High  . Carpal tunnel syndrome on right  08/01/2014    Priority: Medium  . GERD 10/16/2007    Priority: Medium  . Mild intermittent asthma 02/07/2007    Priority: Medium  . Chronic periscapular pain on right side 01/01/2018    Priority: Low  . Chronic right-sided low back pain without sciatica 08/01/2014    Priority: Low  . Allergic rhinitis 04/10/2012    Priority: Low  . Papanicolaou smear of cervix with positive high risk human papilloma virus (HPV) test 12/06/2007   Current Meds  Medication Sig  . albuterol (VENTOLIN HFA) 108 (90 Base) MCG/ACT inhaler Inhale 2 puffs into the lungs every 6 (six) hours as needed for wheezing or shortness of breath.  . Budesonide (PULMICORT FLEXHALER) 90 MCG/ACT inhaler Inhale 1 puff into the lungs 2 (two) times daily.  Marland Kitchen EPIPEN 2-PAK 0.3 MG/0.3ML SOAJ injection inject as directed WITH BEE STING  . hydrochlorothiazide (HYDRODIURIL) 25 MG tablet Take 1 tablet (25 mg total) by mouth daily.  . meclizine (ANTIVERT) 12.5 MG tablet Take 1 tablet (12.5 mg total) by mouth 3 (three) times daily as needed for dizziness.  . metoprolol succinate (TOPROL-XL) 25 MG 24 hr tablet Take 1 tablet (25 mg total) by mouth daily.  Marland Kitchen omeprazole (PRILOSEC) 40 MG capsule TAKE 1 CAPSULE(40 MG) BY MOUTH DAILY  . [DISCONTINUED] zolpidem (AMBIEN) 5 MG tablet  TAKE 1 TABLET(5 MG) BY MOUTH AT BEDTIME AS NEEDED FOR SLEEP    Allergies: Patient is allergic to bee venom. Family History: Patient family history includes Heart attack in her maternal grandmother; Hypertension in her maternal grandmother; Lung cancer in her maternal aunt. Social History:  Patient  reports that she has been smoking cigarettes. She started smoking about 8 years ago. She has a 3.50 pack-year smoking history. She has never used smokeless tobacco. She reports current alcohol use. She reports previous drug use. Drug: Marijuana.  Review of Systems: Constitutional: Negative for fever malaise or anorexia Cardiovascular: negative for chest pain  Respiratory: negative for SOB or persistent cough Gastrointestinal: negative for abdominal pain  Objective  Vitals: BP 130/84 (BP Location: Left Arm, Patient Position: Sitting, Cuff Size: Normal)   Pulse 70   Temp 98.2 F (36.8 C) (Temporal)   Ht 5\' 2"  (1.575 m)   Wt 161 lb (73 kg)   SpO2 99%   BMI 29.45 kg/m  General: no acute distress , A&Ox3 HEENT: PEERL, conjunctiva normal, Oropharynx moist,neck is supple Cardiovascular:  RRR without murmur or gallop.  Respiratory:  Good breath sounds bilaterally, CTAB with normal respiratory effort Skin:  Warm, no rashes Multiple bilateral back trigger points that are very ttp     Commons side effects, risks, benefits, and alternatives for medications and treatment plan prescribed today were discussed, and the patient expressed understanding of the given instructions. Patient is instructed to call or message via MyChart if he/she has any questions or concerns regarding our treatment plan. No barriers to understanding were identified. We discussed Red Flag symptoms and signs in detail. Patient expressed understanding regarding what to do in case of urgent or emergency type symptoms.   Medication list was reconciled, printed and provided to the patient in AVS. Patient instructions and summary information was reviewed with the patient as documented in the AVS. This note was prepared with assistance of Dragon voice recognition software. Occasional wrong-word or sound-a-like substitutions may have occurred due to the inherent limitations of voice recognition software  This visit occurred during the SARS-CoV-2 public health emergency.  Safety protocols were in place, including screening questions prior to the visit, additional usage of staff PPE, and extensive cleaning of exam room while observing appropriate contact time as indicated for disinfecting solutions.

## 2019-06-24 NOTE — Patient Instructions (Signed)
Please return in 3 months for recheck on pain.   Read about fibromyalgia and see if you see yourself in the descriptions.  You do have arthritis in your neck as well.   Start the elavil one tablet nightly. Increase to 2 tablets if tolerated in time. See if it helps both your sleep and your pain.   If you have any questions or concerns, please don't hesitate to send me a message via MyChart or call the office at [phone]. Thank you for visiting with Jessica Hale today! It's our pleasure caring for you.   Myofascial Pain Syndrome and Fibromyalgia Myofascial pain syndrome and fibromyalgia are both pain disorders. This pain may be felt mainly in your muscles.  Myofascial pain syndrome: ? Always has tender points in the muscle that will cause pain when pressed (trigger points). The pain may come and go. ? Usually affects your neck, upper back, and shoulder areas. The pain often radiates into your arms and hands.  Fibromyalgia: ? Has muscle pains and tenderness that come and go. ? Is often associated with fatigue and sleep problems. ? Has trigger points. ? Tends to be long-lasting (chronic), but is not life-threatening. Fibromyalgia and myofascial pain syndrome are not the same. However, they often occur together. If you have both conditions, each can make the other worse. Both are common and can cause enough pain and fatigue to make day-to-day activities difficult. Both can be hard to diagnose because their symptoms are common in many other conditions. What are the causes? The exact causes of these conditions are not known. What increases the risk? You are more likely to develop this condition if:  You have a family history of the condition.  You have certain triggers, such as: ? Spine disorders. ? An injury (trauma) or other physical stressors. ? Being under a lot of stress. ? Medical conditions such as osteoarthritis, rheumatoid arthritis, or lupus. What are the signs or symptoms?  Fibromyalgia The main symptom of fibromyalgia is widespread pain and tenderness in your muscles. Pain is sometimes described as stabbing, shooting, or burning. You may also have:  Tingling or numbness.  Sleep problems and fatigue.  Problems with attention and concentration (fibro fog). Other symptoms may include:  Bowel and bladder problems.  Headaches.  Visual problems.  Problems with odors and noises.  Depression or mood changes.  Painful menstrual periods (dysmenorrhea).  Dry skin or eyes. These symptoms can vary over time. Myofascial pain syndrome Symptoms of myofascial pain syndrome include:  Tight, ropy bands of muscle.  Uncomfortable sensations in muscle areas. These may include aching, cramping, burning, numbness, tingling, and weakness.  Difficulty moving certain parts of the body freely (poor range of motion). How is this diagnosed? This condition may be diagnosed by your symptoms and medical history. You will also have a physical exam. In general:  Fibromyalgia is diagnosed if you have pain, fatigue, and other symptoms for more than 3 months, and symptoms cannot be explained by another condition.  Myofascial pain syndrome is diagnosed if you have trigger points in your muscles, and those trigger points are tender and cause pain elsewhere in your body (referred pain). How is this treated? Treatment for these conditions depends on the type that you have.  For fibromyalgia: ? Pain medicines, such as NSAIDs. ? Medicines for treating depression. ? Medicines for treating seizures. ? Medicines that relax the muscles.  For myofascial pain: ? Pain medicines, such as NSAIDs. ? Cooling and stretching of muscles. ? Trigger point  injections. ? Sound wave (ultrasound) treatments to stimulate muscles. Treating these conditions often requires a team of health care providers. These may include:  Your primary care provider.  Physical therapist.  Complementary  health care providers, such as massage therapists or acupuncturists.  Psychiatrist for cognitive behavioral therapy. Follow these instructions at home: Medicines  Take over-the-counter and prescription medicines only as told by your health care provider.  Do not drive or use heavy machinery while taking prescription pain medicine.  If you are taking prescription pain medicine, take actions to prevent or treat constipation. Your health care provider may recommend that you: ? Drink enough fluid to keep your urine pale yellow. ? Eat foods that are high in fiber, such as fresh fruits and vegetables, whole grains, and beans. ? Limit foods that are high in fat and processed sugars, such as fried or sweet foods. ? Take an over-the-counter or prescription medicine for constipation. Lifestyle   Exercise as directed by your health care provider or physical therapist.  Practice relaxation techniques to control your stress. You may want to try: ? Biofeedback. ? Visual imagery. ? Hypnosis. ? Muscle relaxation. ? Yoga. ? Meditation.  Maintain a healthy lifestyle. This includes eating a healthy diet and getting enough sleep.  Do not use any products that contain nicotine or tobacco, such as cigarettes and e-cigarettes. If you need help quitting, ask your health care provider. General instructions  Talk to your health care provider about complementary treatments, such as acupuncture or massage.  Consider joining a support group with others who are diagnosed with this condition.  Do not do activities that stress or strain your muscles. This includes repetitive motions and heavy lifting.  Keep all follow-up visits as told by your health care provider. This is important. Where to find more information  National Fibromyalgia Association: www.fmaware.New Site: www.arthritis.org  American Chronic Pain Association: www.theacpa.org Contact a health care provider if:  You  have new symptoms.  Your symptoms get worse or your pain is severe.  You have side effects from your medicines.  You have trouble sleeping.  Your condition is causing depression or anxiety. Summary  Myofascial pain syndrome and fibromyalgia are pain disorders.  Myofascial pain syndrome has tender points in the muscle that will cause pain when pressed (trigger points). Fibromyalgia also has muscle pains and tenderness that come and go, but this condition is often associated with fatigue and sleep disturbances.  Fibromyalgia and myofascial pain syndrome are not the same but often occur together, causing pain and fatigue that make day-to-day activities difficult.  Treatment for fibromyalgia includes taking medicines to relax the muscles and medicines for pain, depression, or seizures. Treatment for myofascial pain syndrome includes taking medicines for pain, cooling and stretching of muscles, and injecting medicines into trigger points.  Follow your health care provider's instructions for taking medicines and maintaining a healthy lifestyle. This information is not intended to replace advice given to you by your health care provider. Make sure you discuss any questions you have with your health care provider. Document Released: 07/04/2005 Document Revised: 10/26/2018 Document Reviewed: 07/19/2017 Elsevier Patient Education  2020 [employer].

## 2019-07-01 ENCOUNTER — Encounter: Payer: Self-pay | Admitting: Family Medicine

## 2019-08-19 ENCOUNTER — Other Ambulatory Visit: Payer: Self-pay

## 2019-08-19 ENCOUNTER — Telehealth: Payer: Self-pay | Admitting: Family Medicine

## 2019-08-19 ENCOUNTER — Encounter: Payer: Self-pay | Admitting: Family Medicine

## 2019-08-19 ENCOUNTER — Ambulatory Visit (INDEPENDENT_AMBULATORY_CARE_PROVIDER_SITE_OTHER): Payer: 59 | Admitting: Family Medicine

## 2019-08-19 VITALS — BP 124/86 | HR 75 | Temp 96.0°F | Ht 62.0 in | Wt 164.6 lb

## 2019-08-19 DIAGNOSIS — F5101 Primary insomnia: Secondary | ICD-10-CM

## 2019-08-19 DIAGNOSIS — L03011 Cellulitis of right finger: Secondary | ICD-10-CM

## 2019-08-19 NOTE — Telephone Encounter (Signed)
Pt was seen inperson

## 2019-08-19 NOTE — Patient Instructions (Signed)
Please see the hand surgeon tomorrow as scheduled.  Use advil 2-3/3x day for pain.  Soak as needed.   If you have any questions or concerns, please don't hesitate to send me a message via MyChart or call the office at [phone]. Thank you for visiting with [place] today! It's our pleasure caring for you.   Fingertip Infection There are two main types of fingertip infections:  Long-term (chronic) or acute paronychia. This is an infection that happens around your nail. This type of infection can start in one nail or occur gradually over time and affect more than one nail. The fingernails that are infected may become thick and deformed. This condition can also happen suddenly (be acute).  Felon. This is a bacterial infection in the tip of your finger (pad). A felon infection can cause a painful collection of pus (an abscess) to form inside your fingertip. If the infection is not treated, the infection can spread as deep as the tendon or bone. What are the causes? Paronychia infection can be caused by:  Bacteria.  Funguses.  A mix of both bacteria and funguses. A felon infection is usually caused by the bacteria that are normally found on your skin. An infection can develop if the bacteria spread through your skin to the pad of tissue inside your fingertip. What increases the risk? You are more likely to develop a fingertip infection if:  You have diabetes.  You have a weak body's defense system (immune system).  You work with your hands.  Your hands are exposed to moisture, chemicals, or irritants for long periods of time.  You have poor circulation.  You bite, chew, or pick your fingernails. What are the signs or symptoms? Symptoms of paronychia infection may affect one or more fingernails and may include:  Pain, swelling, and redness around the nail.  Pus-filled pockets at the base or side of the fingernail (cuticle).  Thick fingernails that separate from the nail bed.  Pus  that drains from the nail bed. Symptoms of a felon usually affect just one fingertip pad and include:  Severe, throbbing pain.  Redness.  Swelling.  Warmth.  Tenderness when the affected fingertip is touched. How is this diagnosed? This condition is diagnosed based on:  Your medical history.  A physical exam.  Testing. If there is pus draining from the infection, it may be swabbed and sent to the lab for a culture.  An X-ray. This may be done to see if the infection has spread to the bone. How is this treated? Treatment for a fingertip infection may include:  Warm water or salt-water soaks several times per day.  Antibiotic medicine. This may be an ointment or pills.  Steroid ointment.  Antifungal pills.  Drainage of pus pockets. This is done by making an incision to open the fingertip to drain pus.  Wearing gloves to protect your nails. Follow these instructions at home: Medicines  Take or apply over-the-counter and prescription medicines only as told by your health care provider.  If you were prescribed an antibiotic medicine, take or apply it as told by your health care provider. Do not stop using the antibiotic even if you start to feel better. Wound care  Follow instructions from your health care provider about how to take care of your wound. Make sure you: ? Wash your hands with soap and water before and after you change your bandage (dressing). If soap and water are not available, use hand sanitizer. ? Change your  dressing as told by your health care provider. ? Leave stitches (sutures), skin glue, or adhesive strips in place. These skin closures may need to stay in place for 2 weeks or longer. If adhesive strip edges start to loosen and curl up, you may trim the loose edges. Do not remove adhesive strips completely unless your health care provider tells you to do that.  Clean the infected area each day with warm water or salt water, or as told by your health  care provider. ? Gently wash the infected area with mild soap and water. ? Rinse the infected area with water to remove all soap. ? Pat the infected area dry with a clean towel. Do not rub it. ? To make a salt-water mixture, completely dissolve -1 tsp (3-6 g) of salt in 1 cup (237 mL) of warm water.  Check the infected area every day for more signs of infection. Watch for: ? More redness, swelling, or pain. ? More fluid or blood. ? Warmth. ? A bad smell. Bathing  Keep the dressing dry until your health care provider says it can be removed.  Ask your health care provider if you may take baths, swim, shower, or use a hot tub. To help prevent spread of the infection, you may only be allowed to take sponge baths. This is rare.  Do not let your bandage get wet. Cover it with a watertight covering when you take a bath or shower. General instructions  Follow instructions from your health care provider about: ? How to take care of the infection. ? When and how you should change your bandage (dressing). ? When you should remove your dressing.  Raise (elevate) the infected area above the level of your heart while you are sitting or lying down or as told by your health care provider. This will help reduce inflammation.  Do not scratch or pick at the infected area.  Wear gloves as told by your health care provider.  Keep all follow-up visits as told by your health care provider. This is important. How is this prevented?  Wear gloves when you work with your hands.  Wash your hands often with antibacterial soap.  Avoid letting your hands stay wet or irritated for long periods of time.  Do not bite your fingernails.  Do not suck on your fingers.  Do not pull on your cuticles.  Use clean scissors or nail clippers to trim your nails. Do not cut your fingernails very short. Contact a health care provider if:  Your pain medicine is not helping.  You have more redness, swelling, or  pain at your fingertip.  You continue to have fluid, blood, or pus coming from your fingertip.  Your infection area feels warm to the touch.  You continue to notice a bad smell coming from your fingertip or your dressing. Get help right away if:  The area of redness is spreading, or you notice a red streak going away from your fingertip.  You have a fever. Summary  Paronychia is an infection that happens around your nail. Paronychia infection can be caused by bacteria, funguses, or a mix of both.  A felon infection is usually caused by the bacteria that are normally found on your skin. An infection can develop if the bacteria spread through your skin to the pad of tissue inside your fingertip.  Follow instructions from your health care provider about how to take care of the infection.  Take or apply over-the-counter and prescription medicines  only as told by your health care provider.  Contact a health care provider if you have more drainage, redness, swelling, or pain at your fingertip. This information is not intended to replace advice given to you by your health care provider. Make sure you discuss any questions you have with your health care provider. Document Revised: 04/03/2018 Document Reviewed: 04/03/2018 Elsevier Patient Education  2020 [employer]  Paronychia Paronychia is an infection of the skin that surrounds a nail. It usually affects the skin around a fingernail, but it may also occur near a toenail. It often causes pain and swelling around the nail. In some cases, a collection of pus (abscess) can form near or under the nail.  This condition may develop suddenly, or it may develop gradually over a longer period. In most cases, paronychia is not serious, and it will clear up with treatment. What are the causes? This condition may be caused by bacteria or a fungus. These germs can enter the body through an opening in the skin, such as a cut or a hangnail. What  increases the risk? This condition is more likely to develop in people who:  Get their hands wet often, such as those who work as Fish farm manager, bartenders, or nurses.  Bite their fingernails or suck their thumbs.  Trim their nails very short.  Have hangnails or injured fingertips.  Get manicures.  Have diabetes. What are the signs or symptoms? Symptoms of this condition include:  Redness and swelling of the skin near the nail.  Tenderness around the nail when you touch the area.  Pus-filled bumps under the skin at the base and sides of the nail (cuticle).  Fluid or pus under the nail.  Throbbing pain in the area. How is this diagnosed? This condition is diagnosed with a physical exam. In some cases, a sample of pus may be tested to determine what type of bacteria or fungus is causing the condition. How is this treated? Treatment depends on the cause and severity of your condition. If your condition is mild, it may clear up on its own in a few days or after soaking in warm water. If needed, treatment may include:  Antibiotic medicine, if your infection is caused by bacteria.  Antifungal medicine, if your infection is caused by a fungus.  A procedure to drain pus from an abscess.  Anti-inflammatory medicine (corticosteroids). Follow these instructions at home: Wound care  Keep the affected area clean.  Soak the affected area in warm water, if told to do so by your health care provider. You may be told to do this for 20 minutes, 2-3 times a day.  Keep the area dry when you are not soaking it.  Do not try to drain an abscess yourself.  Follow instructions from your health care provider about how to take care of the affected area. Make sure you: ? Wash your hands with soap and water before you change your bandage (dressing). If soap and water are not available, use hand sanitizer. ? Change your dressing as told by your health care provider.  If you had an abscess  drained, check the area every day for signs of infection. Check for: ? Redness, swelling, or pain. ? Fluid or blood. ? Warmth. ? Pus or a bad smell. Medicines   Take over-the-counter and prescription medicines only as told by your health care provider.  If you were prescribed an antibiotic medicine, take it as told by your health care provider. Do not stop  taking the antibiotic even if you start to feel better. General instructions  Avoid contact with harsh chemicals.  Do not pick at the affected area. Prevention  To prevent this condition from happening again: ? Wear rubber gloves when washing dishes or doing other tasks that require your hands to get wet. ? Wear gloves if your hands might come in contact with cleaners or other chemicals. ? Avoid injuring your nails or fingertips. ? Do not bite your nails or tear hangnails. ? Do not cut your nails very short. ? Do not cut your cuticles. ? Use clean nail clippers or scissors when trimming nails. Contact a health care provider if:  Your symptoms get worse or do not improve with treatment.  You have continued or increased fluid, blood, or pus coming from the affected area.  Your finger or knuckle becomes swollen or difficult to move. Get help right away if you have:  A fever or chills.  Redness spreading away from the affected area.  Joint or muscle pain. Summary  Paronychia is an infection of the skin that surrounds a nail. It often causes pain and swelling around the nail. In some cases, a collection of pus (abscess) can form near or under the nail.  This condition may be caused by bacteria or a fungus. These germs can enter the body through an opening in the skin, such as a cut or a hangnail.  If your condition is mild, it may clear up on its own in a few days. If needed, treatment may include medicine or a procedure to drain pus from an abscess.  To prevent this condition from happening again, wear gloves if doing  tasks that require your hands to get wet or to come in contact with chemicals. Also avoid injuring your nails or fingertips. This information is not intended to replace advice given to you by your health care provider. Make sure you discuss any questions you have with your health care provider. Document Revised: 07/21/2017 Document Reviewed: 07/17/2017 Elsevier Patient Education  2020 [employer].

## 2019-08-19 NOTE — Telephone Encounter (Signed)
Pt called [facility] stating she may have an infection in her finger. States it is swollen and the tip is turning blue and black. States they are in excruciating pain and it is swelling up by the minute. Pt wants to see Dr. Mardelle Matte. [facility] advised pt to go to ED. Pt called office and wanted to schedule an appt. Please advise.

## 2019-08-19 NOTE — Telephone Encounter (Signed)
LAST APPOINTMENT DATE: 08/19/2019  NEXT APPOINTMENT DATE:@Visit  date not found   LAST REFILL: 05/27/2019  QTY: 30

## 2019-08-19 NOTE — Telephone Encounter (Signed)
Please advise 

## 2019-08-19 NOTE — Progress Notes (Signed)
Subjective  CC:  Chief Complaint  Patient presents with  . Infected finger    Started yesterday morning. Patient state that she has a hang nail on her right hand middle finger. Finger is swollen and numb.    HPI: Jessica Hale is a 44 y.o. female who presents to the office today to address the problems listed above in the chief complaint.  44 yo with painful couldn't sleep due to painfule right middle finger. Had hang nail and now with swelling to 2nd knuckle. Throbbing. No fevers. No injuries. New problem.   Assessment  1. Felon of finger of right hand   2. Paronychia of right middle finger      Plan   Possible felon and soft tissue infection:  Refer to hand surgery urgently for eval.   Follow up: stat referral  Visit date not found  Orders Placed This Encounter  Procedures  . Ambulatory referral to Hand Surgery   No orders of the defined types were placed in this encounter.     I reviewed the patients updated PMH, FH, and SocHx.    Patient Active Problem List   Diagnosis Date Noted  . Cervical radiculopathy 04/20/2018    Priority: High  . Meniere's disease (cochlear hydrops) 01/25/2013    Priority: High  . Atypical migraine 05/02/2011    Priority: High  . Benign essential HTN 02/07/2007    Priority: High  . Carpal tunnel syndrome on right 08/01/2014    Priority: Medium  . GERD 10/16/2007    Priority: Medium  . Mild intermittent asthma 02/07/2007    Priority: Medium  . Chronic periscapular pain on right side 01/01/2018    Priority: Low  . Chronic right-sided low back pain without sciatica 08/01/2014    Priority: Low  . Allergic rhinitis 04/10/2012    Priority: Low  . Papanicolaou smear of cervix with positive high risk human papilloma virus (HPV) test 12/06/2007   Current Meds  Medication Sig  . Budesonide (PULMICORT FLEXHALER) 90 MCG/ACT inhaler Inhale 1 puff into the lungs 2 (two) times daily.  Marland Kitchen EPIPEN 2-PAK 0.3 MG/0.3ML SOAJ injection inject as  directed WITH BEE STING  . hydrochlorothiazide (HYDRODIURIL) 25 MG tablet Take 1 tablet (25 mg total) by mouth daily.  . metoprolol succinate (TOPROL-XL) 25 MG 24 hr tablet Take 1 tablet (25 mg total) by mouth daily.  Marland Kitchen omeprazole (PRILOSEC) 40 MG capsule TAKE 1 CAPSULE(40 MG) BY MOUTH DAILY  . zolpidem (AMBIEN) 5 MG tablet Take 5 mg by mouth at bedtime as needed for sleep.    Allergies: Patient is allergic to bee venom. Family History: Patient family history includes Heart attack in her maternal grandmother; Hypertension in her maternal grandmother; Lung cancer in her maternal aunt. Social History:  Patient  reports that she has been smoking cigarettes. She started smoking about 9 years ago. She has a 3.50 pack-year smoking history. She has never used smokeless tobacco. She reports current alcohol use. She reports previous drug use. Drug: Marijuana.  Review of Systems: Constitutional: Negative for fever malaise or anorexia Cardiovascular: negative for chest pain Respiratory: negative for SOB or persistent cough Gastrointestinal: negative for abdominal pain  Objective  Vitals: BP 124/86 (BP Location: Left Arm, Patient Position: Sitting, Cuff Size: Normal)   Pulse 75   Temp (!) 96 F (35.6 C) (Temporal)   Ht 5\' 2"  (1.575 m)   Wt 164 lb 9.6 oz (74.7 kg)   LMP 08/07/2019   SpO2 99%  BMI 30.11 kg/m  General: appears mildly uncomfortable, A&Ox3 Right hand: middle finger with paronychia and proximal swelling to PIP with tenderness over finger pad and finger.  Incision and Drainage Procedure Note  Pre-operative Diagnosis: paronychia  Post-operative Diagnosis: same, possible felon  Indications: abscess/pain  Anesthesia: cold spray  Procedure Details  The procedure, risks and complications have been discussed in detail (including, but not limited to airway compromise, infection, bleeding) with the patient, and the patient has signed consent to the procedure.  The skin was  sterilely prepped over the affected area in the usual fashion. After cold spray was used, I&D with a #11 blade was performed on the medial aspect of the right middle paronychia. Purulent drainage: absent The patient was observed until stable.  Findings: pus  Due to proximal swelling and tightness with pain, procedure was stopped.   Condition: Tolerated procedure well and Stable     Commons side effects, risks, benefits, and alternatives for medications and treatment plan prescribed today were discussed, and the patient expressed understanding of the given instructions. Patient is instructed to call or message via MyChart if he/she has any questions or concerns regarding our treatment plan. No barriers to understanding were identified. We discussed Red Flag symptoms and signs in detail. Patient expressed understanding regarding what to do in case of urgent or emergency type symptoms.   Medication list was reconciled, printed and provided to the patient in AVS. Patient instructions and summary information was reviewed with the patient as documented in the AVS. This note was prepared with assistance of Dragon voice recognition software. Occasional wrong-word or sound-a-like substitutions may have occurred due to the inherent limitations of voice recognition software  This visit occurred during the SARS-CoV-2 public health emergency.  Safety protocols were in place, including screening questions prior to the visit, additional usage of staff PPE, and extensive cleaning of exam room while observing appropriate contact time as indicated for disinfecting solutions.

## 2019-08-21 MED ORDER — ZOLPIDEM TARTRATE 5 MG PO TABS: 5.0000 mg | ORAL_TABLET | Freq: Every evening | ORAL | 2 refills | Status: DC | PRN

## 2019-08-26 ENCOUNTER — Other Ambulatory Visit: Payer: Self-pay

## 2019-08-26 ENCOUNTER — Encounter: Payer: Self-pay | Admitting: Family Medicine

## 2019-08-26 DIAGNOSIS — I10 Essential (primary) hypertension: Secondary | ICD-10-CM

## 2019-08-26 MED ORDER — HYDROCHLOROTHIAZIDE 25 MG PO TABS: 25.0000 mg | ORAL_TABLET | Freq: Every day | ORAL | 3 refills | Status: DC

## 2019-08-28 ENCOUNTER — Other Ambulatory Visit: Payer: Self-pay

## 2019-08-28 DIAGNOSIS — I10 Essential (primary) hypertension: Secondary | ICD-10-CM

## 2019-08-28 MED ORDER — HYDROCHLOROTHIAZIDE 25 MG PO TABS: 25.0000 mg | ORAL_TABLET | Freq: Every day | ORAL | 3 refills | Status: DC

## 2019-10-14 ENCOUNTER — Encounter: Payer: Self-pay | Admitting: Family Medicine

## 2019-11-29 ENCOUNTER — Telehealth (INDEPENDENT_AMBULATORY_CARE_PROVIDER_SITE_OTHER): Payer: 59 | Admitting: Family Medicine

## 2019-11-29 ENCOUNTER — Encounter: Payer: Self-pay | Admitting: Family Medicine

## 2019-11-29 ENCOUNTER — Other Ambulatory Visit: Payer: Self-pay

## 2019-11-29 DIAGNOSIS — Z716 Tobacco abuse counseling: Secondary | ICD-10-CM | POA: Diagnosis not present

## 2019-11-29 DIAGNOSIS — F17210 Nicotine dependence, cigarettes, uncomplicated: Secondary | ICD-10-CM

## 2019-11-29 MED ORDER — CHANTIX STARTING MONTH PAK 0.5 MG X 11 & 1 MG X 42 PO TABS: ORAL_TABLET | ORAL | 0 refills | Status: DC

## 2019-11-29 MED ORDER — VARENICLINE TARTRATE 1 MG PO TABS: 1.0000 mg | ORAL_TABLET | Freq: Two times a day (BID) | ORAL | 5 refills | Status: DC

## 2019-11-29 NOTE — Progress Notes (Signed)
Virtual Visit via Video Note  Subjective  CC:  Chief Complaint  Patient presents with  . Nicotine Dependence    Would like to discuss starting Chantix.      I connected with Jericca Russett Flaugher on 11/29/19 at  3:30 PM EDT by a video enabled telemedicine application and verified that I am speaking with the correct person using two identifiers. Location patient: Home Location provider: [facility] [place], Office Persons participating in the virtual visit: AUDIANNA LANDGREN, Leamon Arnt, MD Reymundo Poll CMA  I discussed the limitations of evaluation and management by telemedicine and the availability of in person appointments. The patient expressed understanding and agreed to proceed. HPI: Jessica Hale is a 44 y.o. female who was contacted today to address the problems listed above in the chief complaint. Smoker x 12-15 years. Quit last year while on chantix for 2 months. Worked very well. Stopped and then stressors caused relapsed. Had some strange dreams on it but no other mood or AEs. Would like to quit permanently.  No h/o mood disorders. Does have chronic pain; smoking can help deal with that stress. Has friends who are smokers.  Assessment  1. Encounter for tobacco use cessation counseling   2. Cigarette nicotine dependence without complication      Plan   Smoking cessation:  rec restart chantix; since worked so well last time, rec staying on it for 3-6 months. F/u if AEs or barriers to success identified. Discussed risks/benefits of meds.   I discussed the assessment and treatment plan with the patient. The patient was provided an opportunity to ask questions and all were answered. The patient agreed with the plan and demonstrated an understanding of the instructions.   The patient was advised to call back or seek an in-person evaluation if the symptoms worsen or if the condition fails to improve as anticipated. Follow up: No follow-ups on file.  Visit  date not found  No orders of the defined types were placed in this encounter.     I reviewed the patients updated PMH, FH, and SocHx.    Patient Active Problem List   Diagnosis Date Noted  . Cervical radiculopathy 04/20/2018    Priority: High  . Meniere's disease (cochlear hydrops) 01/25/2013    Priority: High  . Atypical migraine 05/02/2011    Priority: High  . Benign essential HTN 02/07/2007    Priority: High  . Carpal tunnel syndrome on right 08/01/2014    Priority: Medium  . GERD 10/16/2007    Priority: Medium  . Mild intermittent asthma 02/07/2007    Priority: Medium  . Chronic periscapular pain on right side 01/01/2018    Priority: Low  . Chronic right-sided low back pain without sciatica 08/01/2014    Priority: Low  . Allergic rhinitis 04/10/2012    Priority: Low  . Papanicolaou smear of cervix with positive high risk human papilloma virus (HPV) test 12/06/2007   Current Meds  Medication Sig  . albuterol (VENTOLIN HFA) 108 (90 Base) MCG/ACT inhaler Inhale 2 puffs into the lungs every 6 (six) hours as needed for wheezing or shortness of breath.  . Budesonide (PULMICORT FLEXHALER) 90 MCG/ACT inhaler Inhale 1 puff into the lungs 2 (two) times daily.  Marland Kitchen EPIPEN 2-PAK 0.3 MG/0.3ML SOAJ injection inject as directed WITH BEE STING  . hydrochlorothiazide (HYDRODIURIL) 25 MG tablet Take 1 tablet (25 mg total) by mouth daily.  . metoprolol succinate (TOPROL-XL) 25 MG 24  hr tablet Take 1 tablet (25 mg total) by mouth daily.  Marland Kitchen omeprazole (PRILOSEC) 40 MG capsule TAKE 1 CAPSULE(40 MG) BY MOUTH DAILY  . zolpidem (AMBIEN) 5 MG tablet Take 1 tablet (5 mg total) by mouth at bedtime as needed for sleep.    Allergies: Patient is allergic to bee venom and other. Family History: Patient family history includes Heart attack in her maternal grandmother; Hypertension in her maternal grandmother; Lung cancer in her maternal aunt. Social History:  Patient  reports that she has been  smoking cigarettes. She started smoking about 9 years ago. She has a 3.50 pack-year smoking history. She has never used smokeless tobacco. She reports current alcohol use. She reports previous drug use. Drug: Marijuana.  Review of Systems: Constitutional: Negative for fever malaise or anorexia Cardiovascular: negative for chest pain Respiratory: negative for SOB or persistent cough Gastrointestinal: negative for abdominal pain  OBJECTIVE Vitals: There were no vitals taken for this visit. General: no acute distress , A&Ox3  Willow Ora, MD

## 2019-12-18 ENCOUNTER — Encounter: Payer: Self-pay | Admitting: Family Medicine

## 2019-12-19 ENCOUNTER — Encounter: Payer: Self-pay | Admitting: Family Medicine

## 2020-01-07 ENCOUNTER — Encounter: Payer: Self-pay | Admitting: Family Medicine

## 2020-01-09 ENCOUNTER — Other Ambulatory Visit: Payer: Self-pay

## 2020-01-09 DIAGNOSIS — M549 Dorsalgia, unspecified: Secondary | ICD-10-CM

## 2020-01-09 DIAGNOSIS — G8929 Other chronic pain: Secondary | ICD-10-CM

## 2020-01-30 ENCOUNTER — Ambulatory Visit: Payer: 59 | Admitting: Physical Therapy

## 2020-01-30 ENCOUNTER — Encounter: Payer: Self-pay | Admitting: Physical Therapy

## 2020-01-30 ENCOUNTER — Other Ambulatory Visit: Payer: Self-pay

## 2020-01-30 DIAGNOSIS — M546 Pain in thoracic spine: Secondary | ICD-10-CM

## 2020-01-30 NOTE — Therapy (Addendum)
[facility] [address] [place], [place], [address] Phone: [phone]   Fax:  [phone]  Physical Therapy Evaluation  Patient Details  Name: Jessica Hale MRN: [id number] Date of Birth: Oct 07, 1975 Referring Provider (PT): Billey Chang   Encounter Date: 01/30/2020   PT End of Session - 01/30/20 2217     Visit Number 1    Number of Visits 12    Date for PT Re-Evaluation 03/12/20    Authorization Type [employer]    PT Start Time 1520    PT Stop Time 1558    PT Time Calculation (min) 38 min    Activity Tolerance Patient tolerated treatment well    Behavior During Therapy WFL for tasks assessed/performed             Past Medical History:  Diagnosis Date   Anemia    Asthma    GERD (gastroesophageal reflux disease)    Hypertension    Migraine headache     Past Surgical History:  Procedure Laterality Date   KNEE SURGERY     Age 44    There were no vitals filed for this visit.    Subjective Assessment - 01/30/20 1526     Subjective R sided thoracic pain, wraps around into R ribs. Chronic pain 8 yrs, Trying to work out, having increased pain,Does  T-mill, ropes, push ups .  States significant Increased pain with menstral cycle. Works for [employer], Materials engineer. Has been seen in PT previously for Neck, but not thoracic pain. saw chiro in the past for adjustments but not recently .    Limitations House hold activities;Lifting    Patient Stated Goals Decreased pain    Currently in Pain? Yes    Pain Score 5     Pain Location Back    Pain Orientation Right;Mid    Pain Descriptors / Indicators Aching;Radiating;Sore    Pain Type Chronic pain    Pain Onset More than a month ago    Pain Frequency Intermittent    Aggravating Factors  menstral cycle time, increased UE activity,                [facility] PT Assessment - 01/30/20 0001       Assessment   Medical Diagnosis Thoracic back pain     Referring Provider (PT)  Billey Chang    Prior Therapy for neck pain      Balance Screen   Has the patient fallen in the past 6 months No      Prior Function   Level of Independence Independent      Cognition   Overall Cognitive Status Within Functional Limits for tasks assessed      Posture/Postural Control   Posture Comments WFL, slight L trunk shift away from painful side      ROM / Strength   AROM / PROM / Strength AROM;Strength      AROM   Overall AROM Comments Lumbar: WNL, Shoulders: WNL      Strength   Overall Strength Comments Core: 4-/5, Shoulders: 4+/5       Palpation   Palpation comment Tenderness in central T-spine with PAs, Tenderness into mid t-spine ribs on R, Tenderness in posterior shoulder /scapular musculature                        Objective measurements completed on examination: See above findings.       [facility] Adult PT Treatment/Exercise - 01/30/20  0001       Exercises   Exercises Shoulder      Shoulder Exercises: Standing   Other Standing Exercises Standing QL stretch at [place], for R side body.       Manual Therapy   Manual Therapy Joint mobilization;Soft tissue mobilization    Manual therapy comments skilled palpation and monitoring of soft tissue during dry needling.     Joint Mobilization Thoracic PA mobs gr 3;     Soft tissue mobilization DTM to R posterior shoulder, infrasp, teres region               Trigger Point Dry Needling - 01/30/20 0001     Consent Given? Yes    Education Handout Provided Yes    Muscles Treated Upper Quadrant Infraspinatus;Teres major;Teres minor    Infraspinatus Response Twitch response elicited;Palpable increased muscle length    Teres major Response Palpable increased muscle length    Teres minor Response Palpable increased muscle length                  PT Education - 01/30/20 2217     Education Details PT POC, exam findings, Discussion on manipulation for t-spine    Person(s) Educated Patient     Methods Explanation;Demonstration    Comprehension Verbalized understanding;Returned demonstration              PT Short Term Goals - 01/30/20 2220       PT SHORT TERM GOAL #1   Title Pt to be independent with initial HEP     Time 2    Period Weeks    Status New    Target Date 02/13/20      PT SHORT TERM GOAL #2   Title Pt to report decreased pain in back  to 0-3/10               PT Long Term Goals - 01/30/20 2220       PT LONG TERM GOAL #1   Title Pt to be independent with final HEP    Time 6    Period Weeks    Status New    Target Date 03/12/20      PT LONG TERM GOAL #2   Title Pt to report decreased pain in back  to 0-2/10 with activity    Time 6    Period Weeks    Status New    Target Date 03/12/20      PT LONG TERM GOAL #3   Title Pt to report ability for at least 30 min of excercise with upper body, without increased pain more than 2/10    Time 6    Period Weeks    Status New    Target Date 03/12/20                    Plan - 01/30/20 2222     Clinical Impression Statement Pt presents with primary complaint of increased pain in R thoracic spine region. Pain radiates around to R thoracic ribs. Pt has had pain for several years, but has not had treatment for it thus far. Did discuss possibility of getting thoracic manipulation from sports med or chiropractor. Pt also with tenderness and trigger points in posterior shoulder, addressed with dry needling and DTM today. Pt with decreased ability for full functoinal activities due to pain, and will benefit from skilled PT to improve.    Personal Factors and Comorbidities Time since onset of injury/illness/exacerbation  Examination-Activity Limitations Reach Overhead;Sleep;Lift    Examination-Participation Restrictions Yard Work;Meal Prep;Cleaning;Driving;Laundry    Stability/Clinical Decision Making Stable/Uncomplicated    Clinical Decision Making Low    Rehab Potential Good    PT Frequency 2x  / week    PT Duration 6 weeks    PT Treatment/Interventions ADLs/Self Care Home Management;Cryotherapy;Electrical Stimulation;DME Instruction;Ultrasound;Traction;Moist Heat;Iontophoresis 81m/ml Dexamethasone;Stair training;Functional mobility training;Therapeutic activities;Therapeutic exercise;Patient/family education;Neuromuscular re-education;Manual techniques;Passive range of motion;Dry needling;Taping;Vasopneumatic Device;Energy conservation;Spinal Manipulations;Joint Manipulations    Consulted and Agree with Plan of Care Patient             Patient will benefit from skilled therapeutic intervention in order to improve the following deficits and impairments:  Pain, Increased muscle spasms, Decreased activity tolerance, Decreased strength, Impaired flexibility  Visit Diagnosis: Pain in thoracic spine     Problem List Patient Active Problem List   Diagnosis Date Noted   Cervical radiculopathy 04/20/2018   Chronic periscapular pain on right side 01/01/2018   Chronic right-sided low back pain without sciatica 08/01/2014   Carpal tunnel syndrome on right 08/01/2014   Meniere's disease (cochlear hydrops) 01/25/2013   Allergic rhinitis 04/10/2012   Atypical migraine 05/02/2011   Papanicolaou smear of cervix with positive high risk human papilloma virus (HPV) test 12/06/2007   GERD 10/16/2007   Benign essential HTN 02/07/2007   Mild intermittent asthma 02/07/2007   LLyndee Hensen PT, DPT 10:30 PM  01/30/20   [place] [address]Phone: [phone]  Fax:  [phone] Name: NLORELLA GOMEZMRN: [id number] of Birth: 1Feb 23, 1977 PHYSICAL THERAPY DISCHARGE SUMMARY  Visits from Start of Care: 1 Plan: Patient agrees to discharge.  Patient goals were not met. Patient is being discharged due to not returning since Eval.    LLyndee Hensen PT, DPT 4:04 PM  07/14/21

## 2020-02-17 ENCOUNTER — Encounter: Payer: 59 | Admitting: Physical Therapy

## 2020-02-19 ENCOUNTER — Other Ambulatory Visit: Payer: Self-pay | Admitting: Family Medicine

## 2020-02-20 ENCOUNTER — Encounter: Payer: 59 | Admitting: Physical Therapy

## 2020-05-07 ENCOUNTER — Other Ambulatory Visit: Payer: Self-pay | Admitting: Family Medicine

## 2020-05-15 ENCOUNTER — Encounter: Payer: Self-pay | Admitting: Family Medicine

## 2020-06-04 ENCOUNTER — Other Ambulatory Visit: Payer: Self-pay | Admitting: Family Medicine

## 2020-06-05 ENCOUNTER — Encounter: Payer: Self-pay | Admitting: Family Medicine

## 2020-07-05 ENCOUNTER — Other Ambulatory Visit: Payer: Self-pay | Admitting: Family Medicine

## 2020-07-15 ENCOUNTER — Encounter: Payer: Self-pay | Admitting: Family Medicine

## 2020-09-17 ENCOUNTER — Other Ambulatory Visit: Payer: Self-pay | Admitting: Family Medicine

## 2020-09-17 ENCOUNTER — Telehealth: Payer: Self-pay

## 2020-09-17 ENCOUNTER — Encounter: Payer: Self-pay | Admitting: Family Medicine

## 2020-09-17 DIAGNOSIS — I10 Essential (primary) hypertension: Secondary | ICD-10-CM

## 2020-09-17 NOTE — Telephone Encounter (Signed)
Patient would like to know why her blood pressure medication was denied

## 2020-09-18 ENCOUNTER — Other Ambulatory Visit: Payer: Self-pay

## 2020-09-18 DIAGNOSIS — I10 Essential (primary) hypertension: Secondary | ICD-10-CM

## 2020-09-18 MED ORDER — METOPROLOL SUCCINATE ER 25 MG PO TB24: 25.0000 mg | ORAL_TABLET | Freq: Every day | ORAL | 0 refills | Status: DC

## 2020-09-18 MED ORDER — HYDROCHLOROTHIAZIDE 25 MG PO TABS: 25.0000 mg | ORAL_TABLET | Freq: Every day | ORAL | 0 refills | Status: DC

## 2020-09-18 NOTE — Telephone Encounter (Signed)
Patient scheduled.

## 2020-09-18 NOTE — Telephone Encounter (Signed)
Patient has not been seen in office for hypertension f/u since 06/2019. Last appt was May of 2021 virtual. Patient needs an appt.

## 2020-10-14 ENCOUNTER — Other Ambulatory Visit: Payer: Self-pay

## 2020-10-14 ENCOUNTER — Encounter: Payer: Self-pay | Admitting: Family Medicine

## 2020-10-14 ENCOUNTER — Ambulatory Visit (INDEPENDENT_AMBULATORY_CARE_PROVIDER_SITE_OTHER): Payer: 59 | Admitting: Family Medicine

## 2020-10-14 VITALS — BP 146/96 | HR 88 | Temp 98.3°F | Ht 62.0 in | Wt 141.6 lb

## 2020-10-14 DIAGNOSIS — I10 Essential (primary) hypertension: Secondary | ICD-10-CM

## 2020-10-14 DIAGNOSIS — F4321 Adjustment disorder with depressed mood: Secondary | ICD-10-CM | POA: Diagnosis not present

## 2020-10-14 MED ORDER — METOPROLOL SUCCINATE ER 25 MG PO TB24: 50.0000 mg | ORAL_TABLET | Freq: Every day | ORAL | 0 refills | Status: DC

## 2020-10-14 NOTE — Patient Instructions (Signed)
Please return in 3 months for your annual complete physical; please come fasting.  Increase your metoprolol to 50mg  (2 tabs taken together) every morning.  Please check your blood pressures at home and send me readings.  I will release your lab results to you on your MyChart account with further instructions. Please reply with any questions.   If you have any questions or concerns, please don't hesitate to send me a message via MyChart or call the office at [phone]. Thank you for visiting with [phone] today! It's our pleasure caring for you.   Hypertension, Adult High blood pressure (hypertension) is when the force of blood pumping through the arteries is too strong. The arteries are the blood vessels that carry blood from the heart throughout the body. Hypertension forces the heart to work harder to pump blood and may cause arteries to become narrow or stiff. Untreated or uncontrolled hypertension can cause a heart attack, heart failure, a stroke, kidney disease, and other problems. A blood pressure reading consists of a higher number over a lower number. Ideally, your blood pressure should be below 120/80. The first ("top") number is called the systolic pressure. It is a measure of the pressure in your arteries as your heart beats. The second ("bottom") number is called the diastolic pressure. It is a measure of the pressure in your arteries as the heart relaxes. What are the causes? The exact cause of this condition is not known. There are some conditions that result in or are related to high blood pressure. What increases the risk? Some risk factors for high blood pressure are under your control. The following factors may make you more likely to develop this condition:  Smoking.  Having type 2 diabetes mellitus, high cholesterol, or both.  Not getting enough exercise or physical activity.  Being overweight.  Having too much fat, sugar, calories, or salt (sodium) in your  diet.  Drinking too much alcohol. Some risk factors for high blood pressure may be difficult or impossible to change. Some of these factors include:  Having chronic kidney disease.  Having a family history of high blood pressure.  Age. Risk increases with age.  Race. You may be at higher risk if you are African American.  Gender. Men are at higher risk than women before age 71. After age 41, women are at higher risk than men.  Having obstructive sleep apnea.  Stress. What are the signs or symptoms? High blood pressure may not cause symptoms. Very high blood pressure (hypertensive crisis) may cause:  Headache.  Anxiety.  Shortness of breath.  Nosebleed.  Nausea and vomiting.  Vision changes.  Severe chest pain.  Seizures. How is this diagnosed? This condition is diagnosed by measuring your blood pressure while you are seated, with your arm resting on a flat surface, your legs uncrossed, and your feet flat on the floor. The cuff of the blood pressure monitor will be placed directly against the skin of your upper arm at the level of your heart. It should be measured at least twice using the same arm. Certain conditions can cause a difference in blood pressure between your right and left arms. Certain factors can cause blood pressure readings to be lower or higher than normal for a short period of time:  When your blood pressure is higher when you are in a health care provider's office than when you are at home, this is called white coat hypertension. Most people with this condition do not need medicines.  When your blood pressure is higher at home than when you are in a health care provider's office, this is called masked hypertension. Most people with this condition may need medicines to control blood pressure. If you have a high blood pressure reading during one visit or you have normal blood pressure with other risk factors, you may be asked to:  Return on a different day  to have your blood pressure checked again.  Monitor your blood pressure at home for 1 week or longer. If you are diagnosed with hypertension, you may have other blood or imaging tests to help your health care provider understand your overall risk for other conditions. How is this treated? This condition is treated by making healthy lifestyle changes, such as eating healthy foods, exercising more, and reducing your alcohol intake. Your health care provider may prescribe medicine if lifestyle changes are not enough to get your blood pressure under control, and if:  Your systolic blood pressure is above 130.  Your diastolic blood pressure is above 80. Your personal target blood pressure may vary depending on your medical conditions, your age, and other factors. Follow these instructions at home: Eating and drinking  Eat a diet that is high in fiber and potassium, and low in sodium, added sugar, and fat. An example eating plan is called the DASH (Dietary Approaches to Stop Hypertension) diet. To eat this way: ? Eat plenty of fresh fruits and vegetables. Try to fill one half of your plate at each meal with fruits and vegetables. ? Eat whole grains, such as whole-wheat pasta, brown rice, or whole-grain bread. Fill about one fourth of your plate with whole grains. ? Eat or drink low-fat dairy products, such as skim milk or low-fat yogurt. ? Avoid fatty cuts of meat, processed or cured meats, and poultry with skin. Fill about one fourth of your plate with lean proteins, such as fish, chicken without skin, beans, eggs, or tofu. ? Avoid pre-made and processed foods. These tend to be higher in sodium, added sugar, and fat.  Reduce your daily sodium intake. Most people with hypertension should eat less than 1,500 mg of sodium a day.  Do not drink alcohol if: ? Your health care provider tells you not to drink. ? You are pregnant, may be pregnant, or are planning to become pregnant.  If you drink  alcohol: ? Limit how much you use to:  0-1 drink a day for women.  0-2 drinks a day for men. ? Be aware of how much alcohol is in your drink. In the [place] one drink equals one 12 oz bottle of beer (355 mL), one 5 oz glass of wine (148 mL), or one 1 oz glass of hard liquor (44 mL).   Lifestyle  Work with your health care provider to maintain a healthy body weight or to lose weight. Ask what an ideal weight is for you.  Get at least 30 minutes of exercise most days of the week. Activities may include walking, swimming, or biking.  Include exercise to strengthen your muscles (resistance exercise), such as Pilates or lifting weights, as part of your weekly exercise routine. Try to do these types of exercises for 30 minutes at least 3 days a week.  Do not use any products that contain nicotine or tobacco, such as cigarettes, e-cigarettes, and chewing tobacco. If you need help quitting, ask your health care provider.  Monitor your blood pressure at home as told by your health care provider.  Keep all  follow-up visits as told by your health care provider. This is important.   Medicines  Take over-the-counter and prescription medicines only as told by your health care provider. Follow directions carefully. Blood pressure medicines must be taken as prescribed.  Do not skip doses of blood pressure medicine. Doing this puts you at risk for problems and can make the medicine less effective.  Ask your health care provider about side effects or reactions to medicines that you should watch for. Contact a health care provider if you:  Think you are having a reaction to a medicine you are taking.  Have headaches that keep coming back (recurring).  Feel dizzy.  Have swelling in your ankles.  Have trouble with your vision. Get help right away if you:  Develop a severe headache or confusion.  Have unusual weakness or numbness.  Feel faint.  Have severe pain in your chest or  abdomen.  Vomit repeatedly.  Have trouble breathing. Summary  Hypertension is when the force of blood pumping through your arteries is too strong. If this condition is not controlled, it may put you at risk for serious complications.  Your personal target blood pressure may vary depending on your medical conditions, your age, and other factors. For most people, a normal blood pressure is less than 120/80.  Hypertension is treated with lifestyle changes, medicines, or a combination of both. Lifestyle changes include losing weight, eating a healthy, low-sodium diet, exercising more, and limiting alcohol. This information is not intended to replace advice given to you by your health care provider. Make sure you discuss any questions you have with your health care provider. Document Revised: 03/14/2018 Document Reviewed: 03/14/2018 Elsevier Patient Education  2021 [employer].

## 2020-10-14 NOTE — Progress Notes (Signed)
Subjective  CC:  Chief Complaint  Patient presents with  . Hypertension    HPI: Jessica Hale is a 45 y.o. female who presents to the office today to address the problems listed above in the chief complaint. Jessica Hale is here for blood pressure follow-up.  It has been over a year since lab work has been checked.  Hypertension f/u: Control is Unknown. Pt reports she is doing well. taking medications as instructed, no medication side effects noted, no TIAs, no chest pain on exertion, no dyspnea on exertion, no swelling of ankles.  She does not check her blood pressures at home. denies adverse effects from his BP medications. Compliance with medication is good.   Stress: Patient is tearful.  She reports she has been stressed and depressed.  She lost 2 close family members over the last 6 months and back in January her wife left her.  She continues to grieve and struggle.  She is seeing a therapist.  She has never been treated for depression.  At this time she defers medications.  No suicidal ideation.  No panic attacks.  She is trying to keep busy with work and exercise.  Depression screen [facility] 2/9 10/14/2020 05/06/2019 01/11/2019 08/17/2018 12/29/2017  Decreased Interest 2 0 3 0 0  Down, Depressed, Hopeless 2 0 3 0 0  PHQ - 2 Score 4 0 6 0 0  Altered sleeping 2 2 3 2  -  Tired, decreased energy 2 1 3 1  -  Change in appetite 3 1 3  0 -  Feeling bad or failure about yourself  2 0 3 0 -  Trouble concentrating 2 1 3  0 -  Moving slowly or fidgety/restless 2 1 0 1 -  Suicidal thoughts 0 0 0 0 -  PHQ-9 Score 17 6 21 4  -  Difficult doing work/chores Very difficult Somewhat difficult Extremely dIfficult Not difficult at all -  Some recent data might be hidden     Assessment  1. Benign essential HTN   2. Adjustment disorder with depressed mood      Plan    Hypertension f/u: BP control is marginally controlled.  Unclear if blood pressure elevation today is related to stress reaction.  Will  increase Toprol-XL to 50 mg daily.  Patient to start checking home blood pressures with home cuff and send me in readings over the next month.  Can adjust further from there.  Education given.  See after visit summary.  Check lab work.  Mood: Adjustment disorder with depressed mood.  Counseling done.  Continue with therapist.  Monitor and if worsening, recommend follow-up visit to consider medications.  Patient understands and agrees with care plan.  Overdue for complete physical Education regarding management of these chronic disease states was given. Management strategies discussed on successive visits include dietary and exercise recommendations, goals of achieving and maintaining IBW, and lifestyle modifications aiming for adequate sleep and minimizing stressors.   Follow up: 3 months to recheck blood pressure, mood and complete physical  Orders Placed This Encounter  Procedures  . Comprehensive metabolic panel  . CBC with Differential/Platelet  . Lipid panel   Meds ordered this encounter  Medications  . metoprolol succinate (TOPROL-XL) 25 MG 24 hr tablet    Sig: Take 2 tablets (50 mg total) by mouth daily.    Dispense:  90 tablet    Refill:  0      BP Readings from Last 3 Encounters:  10/14/20 (!) 146/96  08/19/19 124/86  06/24/19 130/84   Wt Readings from Last 3 Encounters:  10/14/20 141 lb 9.6 oz (64.2 kg)  08/19/19 164 lb 9.6 oz (74.7 kg)  06/24/19 161 lb (73 kg)    Lab Results  Component Value Date   CHOL 177 05/06/2019   CHOL 185 12/29/2017   CHOL 177 09/17/2015   Lab Results  Component Value Date   HDL 59.40 05/06/2019   HDL 53.40 12/29/2017   HDL 51 09/17/2015   Lab Results  Component Value Date   LDLCALC 103 (H) 05/06/2019   LDLCALC 108 (H) 12/29/2017   LDLCALC 102 09/17/2015   Lab Results  Component Value Date   TRIG 71.0 05/06/2019   TRIG 114.0 12/29/2017   TRIG 122 09/17/2015   Lab Results  Component Value Date   CHOLHDL 3 05/06/2019    CHOLHDL 3 12/29/2017   CHOLHDL 3.5 09/17/2015   No results found for: LDLDIRECT Lab Results  Component Value Date   CREATININE 0.84 05/06/2019   BUN 8 05/06/2019   NA 137 05/06/2019   K 3.8 05/06/2019   CL 106 05/06/2019   CO2 24 05/06/2019    The 10-year ASCVD risk score Denman George DC Jr., et al., 2013) is: 5.2%   Values used to calculate the score:     Age: 73 years     Sex: Female     Is Non-Hispanic African American: Yes     Diabetic: No     Tobacco smoker: Yes     Systolic Blood Pressure: 146 mmHg     Is BP treated: Yes     HDL Cholesterol: 59.4 mg/dL     Total Cholesterol: 177 mg/dL  I reviewed the patients updated PMH, FH, and SocHx.    Patient Active Problem List   Diagnosis Date Noted  . Cervical radiculopathy 04/20/2018    Priority: High  . Meniere's disease (cochlear hydrops) 01/25/2013    Priority: High  . Atypical migraine 05/02/2011    Priority: High  . Benign essential HTN 02/07/2007    Priority: High  . Carpal tunnel syndrome on right 08/01/2014    Priority: Medium  . GERD 10/16/2007    Priority: Medium  . Mild intermittent asthma 02/07/2007    Priority: Medium  . Chronic periscapular pain on right side 01/01/2018    Priority: Low  . Chronic right-sided low back pain without sciatica 08/01/2014    Priority: Low  . Allergic rhinitis 04/10/2012    Priority: Low  . Papanicolaou smear of cervix with positive high risk human papilloma virus (HPV) test 12/06/2007    Allergies: Bee venom and Other  Social History: Patient  reports that she quit smoking about 2 years ago. Her smoking use included cigarettes. She started smoking about 10 years ago. She has a 3.50 pack-year smoking history. She has never used smokeless tobacco. She reports current alcohol use. She reports previous drug use. Drug: Marijuana.  Current Meds  Medication Sig  . EPIPEN 2-PAK 0.3 MG/0.3ML SOAJ injection inject as directed WITH BEE STING  . hydrochlorothiazide (HYDRODIURIL) 25 MG  tablet Take 1 tablet (25 mg total) by mouth daily.  Marland Kitchen PULMICORT FLEXHALER 90 MCG/ACT inhaler INHALE 1 PUFF INTO THE LUNGS TWICE DAILY  . [DISCONTINUED] metoprolol succinate (TOPROL-XL) 25 MG 24 hr tablet Take 1 tablet (25 mg total) by mouth daily. PLEASE SCHEDULE APPT WITH DR. Mardelle Matte FOR FURTHER REFILLS [phone]    Review of Systems: Cardiovascular: negative for chest pain, palpitations, leg swelling, orthopnea Respiratory: negative for SOB, wheezing  or persistent cough Gastrointestinal: negative for abdominal pain Genitourinary: negative for dysuria or gross hematuria  Objective  Vitals: BP (!) 146/96 (BP Location: Left Arm, Patient Position: Sitting, Cuff Size: Normal)   Pulse 88   Temp 98.3 F (36.8 C) (Temporal)   Ht 5\' 2"  (1.575 m)   Wt 141 lb 9.6 oz (64.2 kg)   LMP 10/12/2020 (Approximate) Comment: currently on cycle  SpO2 98%   BMI 25.90 kg/m  General: no acute distress  Psych:  Alert and oriented, flat affect, tearful HEENT:  Normocephalic, atraumatic, supple neck  Cardiovascular:  RRR without murmur. no edema Respiratory:  Good breath sounds bilaterally, CTAB with normal respiratory effort Skin:  Warm, no rashes   Commons side effects, risks, benefits, and alternatives for medications and treatment plan prescribed today were discussed, and the patient expressed understanding of the given instructions. Patient is instructed to call or message via MyChart if he/she has any questions or concerns regarding our treatment plan. No barriers to understanding were identified. We discussed Red Flag symptoms and signs in detail. Patient expressed understanding regarding what to do in case of urgent or emergency type symptoms.   Medication list was reconciled, printed and provided to the patient in AVS. Patient instructions and summary information was reviewed with the patient as documented in the AVS. This note was prepared with assistance of Dragon voice recognition software.  Occasional wrong-word or sound-a-like substitutions may have occurred due to the inherent limitations of voice recognition software  This visit occurred during the SARS-CoV-2 public health emergency.  Safety protocols were in place, including screening questions prior to the visit, additional usage of staff PPE, and extensive cleaning of exam room while observing appropriate contact time as indicated for disinfecting solutions.

## 2020-10-15 LAB — CBC WITH DIFFERENTIAL/PLATELET
Basophils Absolute: 0 10*3/uL (ref 0.0–0.1)
Basophils Relative: 0.8 % (ref 0.0–3.0)
Eosinophils Absolute: 0.1 10*3/uL (ref 0.0–0.7)
Eosinophils Relative: 2 % (ref 0.0–5.0)
HCT: 39 % (ref 36.0–46.0)
Hemoglobin: 13.1 g/dL (ref 12.0–15.0)
Lymphocytes Relative: 36.7 % (ref 12.0–46.0)
Lymphs Abs: 1.8 10*3/uL (ref 0.7–4.0)
MCHC: 33.6 g/dL (ref 30.0–36.0)
MCV: 93.2 fl (ref 78.0–100.0)
Monocytes Absolute: 0.5 10*3/uL (ref 0.1–1.0)
Monocytes Relative: 9.2 % (ref 3.0–12.0)
Neutro Abs: 2.5 10*3/uL (ref 1.4–7.7)
Neutrophils Relative %: 51.3 % (ref 43.0–77.0)
Platelets: 349 10*3/uL (ref 150.0–400.0)
RBC: 4.18 Mil/uL (ref 3.87–5.11)
RDW: 13.5 % (ref 11.5–15.5)
WBC: 4.9 10*3/uL (ref 4.0–10.5)

## 2020-10-15 LAB — COMPREHENSIVE METABOLIC PANEL
ALT: 16 U/L (ref 0–35)
AST: 16 U/L (ref 0–37)
Albumin: 4.8 g/dL (ref 3.5–5.2)
Alkaline Phosphatase: 33 U/L — ABNORMAL LOW (ref 39–117)
BUN: 7 mg/dL (ref 6–23)
CO2: 27 mEq/L (ref 19–32)
Calcium: 10.3 mg/dL (ref 8.4–10.5)
Chloride: 102 mEq/L (ref 96–112)
Creatinine, Ser: 0.95 mg/dL (ref 0.40–1.20)
GFR: 73.93 mL/min (ref >60.00)
Glucose, Bld: 94 mg/dL (ref 70–99)
Potassium: 3.7 mEq/L (ref 3.5–5.1)
Sodium: 138 mEq/L (ref 135–145)
Total Bilirubin: 0.4 mg/dL (ref 0.2–1.2)
Total Protein: 7.6 g/dL (ref 6.0–8.3)

## 2020-10-15 LAB — LIPID PANEL
Cholesterol: 196 mg/dL (ref 0–200)
HDL: 72.2 mg/dL (ref >39.00)
LDL Cholesterol: 114 mg/dL — ABNORMAL HIGH (ref 0–99)
NonHDL: 124.16
Total CHOL/HDL Ratio: 3
Triglycerides: 50 mg/dL (ref 0.0–149.0)
VLDL: 10 mg/dL (ref 0.0–40.0)

## 2020-11-03 ENCOUNTER — Encounter: Payer: Self-pay | Admitting: Family Medicine

## 2020-11-03 MED ORDER — METOPROLOL SUCCINATE ER 50 MG PO TB24: 50.0000 mg | ORAL_TABLET | Freq: Every day | ORAL | 1 refills | Status: DC

## 2020-11-15 ENCOUNTER — Encounter: Payer: Self-pay | Admitting: Family Medicine

## 2020-11-16 NOTE — Telephone Encounter (Signed)
Pt called in trying to make an appointment. Dr. Mardelle Matte does not have any appointments available until next week but the pt said she cannot wait until next week. Can we schedule in a same day or work her in at some point? Pt does not want to see another provider. Please advise.

## 2020-11-16 NOTE — Telephone Encounter (Signed)
Patient has been scheduled

## 2020-11-18 ENCOUNTER — Encounter: Payer: Self-pay | Admitting: Family Medicine

## 2020-11-18 ENCOUNTER — Other Ambulatory Visit: Payer: Self-pay

## 2020-11-18 ENCOUNTER — Ambulatory Visit: Payer: 59 | Admitting: Family Medicine

## 2020-11-18 VITALS — BP 134/90 | HR 80 | Temp 98.1°F | Resp 16 | Ht 62.0 in | Wt 137.2 lb

## 2020-11-18 DIAGNOSIS — F321 Major depressive disorder, single episode, moderate: Secondary | ICD-10-CM | POA: Insufficient documentation

## 2020-11-18 DIAGNOSIS — F410 Panic disorder [episodic paroxysmal anxiety] without agoraphobia: Secondary | ICD-10-CM

## 2020-11-18 MED ORDER — ALPRAZOLAM 0.5 MG PO TABS: 0.5000 mg | ORAL_TABLET | Freq: Every day | ORAL | 0 refills | Status: DC | PRN

## 2020-11-18 MED ORDER — SERTRALINE HCL 25 MG PO TABS: ORAL_TABLET | ORAL | 1 refills | Status: DC

## 2020-11-18 NOTE — Patient Instructions (Signed)
Please return in 4-6 weeks for mood recheck.  Also please schedule a complete physical in 3 months.   If you have any questions or concerns, please don't hesitate to send me a message via MyChart or call the office at [phone]. Thank you for visiting with [place] today! It's our pleasure caring for you.  Depression Medications:  Taking the medicine as directed and not missing any doses is one of the best things you can do to treat your depression.  Here are some things to keep in mind:  1) Side effects (stomach upset, some increased anxiety) may happen before you notice a benefit.  These side effects typically go away over time. 2) Changes to your dose of medicine or a change in medication all together is sometimes necessary 3) Most people need to be on medication at least 6-12 months 4) Many people will notice an improvement within two weeks but the full effect of the medication can take up to 4-6 weeks 5) Stopping the medication when you start feeling better often results in a return of symptoms 6) If you start having thoughts of hurting yourself or others after starting this medicine, please call the office immediately at [phone].    When you're feeling too down to do anything, try these 10 Little things!  Take a shower. Even if you plan to stay in all day long and not see a soul, take a shower. It takes the most effort to hop in to the shower but once you do, you'll feel immediate results. It will wake you up and you'll be feeling much fresher (and cleaner too).  Brush and floss your teeth. Give your teeth a good brushing with a floss finish. It's a small task but it feels so good and you can check 'taking care of your health' off the list of things to do.  Do something small on your list. Most of [place] have some small thing we would like to get done (load of laundry, sew a button, email a friend). Doing one of these things will make you feel like you've accomplished something.  Drink  water. Drinking water is easy right? It's also really beneficial for your health so keep a glass beside you all day and take sips often. It gives you energy and prevents you from boredom eating.  Do some floor exercises. The last thing you want to do is exercise but it might be just the thing you need the most. Keep it simple and do exercises that involve sitting or laying on the floor. Even the smallest of exercises release chemicals in the brain that make you feel good. Yoga stretches or core exercises are going to make you feel good with minimal effort.  Make your bed. Making your bed takes a few minutes but it's productive and you'll feel relieved when it's done. An unmade bed is a huge visual reminder that you're having an unproductive day. Do it and consider it your housework for the day.  Put on some nice clothes. Take the sweatpants off even if you don't plan to go anywhere. Put on clothes that make you feel good. Take a look in the mirror so your brain recognizes the sweatpants have been replaced with clothes that make you look great. It's an instant confidence booster.  Wash the dishes. A pile of dirty dishes in the sink is a reflection of your mood. It's possible that if you wash up the dishes, your mood will follow suit. It's worth  a try.  Adriana Simas a real meal. If you have the luxury to have a "do nothing" day, you have time to make a real meal for yourself. Make a meal that you love to eat. The process is good to get you out of the funk and the food will ensure you have more energy for tomorrow.  Write out your thoughts by hand. When you hand write, you stimulate your brain to focus on the moment that you're in so make yourself comfortable and write whatever comes into your mind. Put those thoughts out on paper so they stop spinning around in your head. Those thoughts might be the very thing holding you down.

## 2020-11-18 NOTE — Progress Notes (Signed)
Subjective  CC:  Chief Complaint  Patient presents with  . Anxiety    On going, patient feels it is getting worse. Previously discussed in January 2022.    HPI: Jessica Hale is a 45 y.o. female who presents to the office today to address the problems listed above in the chief complaint, mood problems.  45 year old female returns due to her depressive and anxious symptoms.  She was here at the end of March for hypertension follow-up and revealed that she was struggling to the loss of several family members and the abrupt dissolution of her marriage back in January.  Since, she suffered from depression symptoms including anhedonia, decreased sleep, increased restlessness, decreased energy, decreased appetite and weight loss, and at times feeling like she does not want to be here. She denies current suicidal or homicidal plan or intent.  She is feeling isolated.  She has children that are supportive but does not have many friends or coworkers and support her.  She is having panic attacks and anxiety symptoms as well.  She is seeing a Veterinary surgeon.  At this time she feels medicines would be helpful.  We initiated conversation in March.  She has never been treated for depression although she might have had mild depressive symptoms over her lifetime.   Depression screen [facility] 2/9 10/14/2020 05/06/2019 01/11/2019  Decreased Interest 2 0 3  Down, Depressed, Hopeless 2 0 3  PHQ - 2 Score 4 0 6  Altered sleeping 2 2 3   Tired, decreased energy 2 1 3   Change in appetite 3 1 3   Feeling bad or failure about yourself  2 0 3  Trouble concentrating 2 1 3   Moving slowly or fidgety/restless 2 1 0  Suicidal thoughts 0 0 0  PHQ-9 Score 17 6 21   Difficult doing work/chores Very difficult Somewhat difficult Extremely dIfficult  Some recent data might be hidden    GAD 7 : Generalized Anxiety Score 11/18/2020 01/11/2019  Nervous, Anxious, on Edge 2 2  Control/stop worrying 2 3  Worry too much - different things 2  3  Trouble relaxing 2 3  Restless 2 1  Easily annoyed or irritable 2 3  Afraid - awful might happen 2 3  Total GAD 7 Score 14 18  Anxiety Difficulty Very difficult Extremely difficult     Assessment  1. Current moderate episode of major depressive disorder without prior episode (HCC)      Plan   Depression with panic: Extensive education counseling given.  Elect Zoloft to treat depression and anxiety.  Continue counseling.  Close follow-up recommended.  Recheck 4 to 6 weeks.  Xanax to be used for panic or anxiety symptoms.  Risk and benefits discussed.  Reviewed concept of mood problems caused by biochemical imbalance of neurotransmitters and rationale for treatment with medications and therapy.   Counseling given: pt was instructed to contact office, on-call physician or crisis Hotline if symptoms worsen significantly. If patient develops any suicidal or homicidal thoughts, she is directed to the ER immediately.   Today's visit was 32 minutes long. Greater than 50% of this time was devoted to face to face counseling with the patient and coordination of care. We discussed her diagnosis, prognosis, treatment options and treatment plan is documented above.    Follow up: No follow-ups on file.  No orders of the defined types were placed in this encounter.  No orders of the defined types were placed in this encounter.     I reviewed  the patients updated PMH, FH, and SocHx.    Patient Active Problem List   Diagnosis Date Noted  . Cervical radiculopathy 04/20/2018    Priority: High  . Meniere's disease (cochlear hydrops) 01/25/2013    Priority: High  . Atypical migraine 05/02/2011    Priority: High  . Benign essential HTN 02/07/2007    Priority: High  . Carpal tunnel syndrome on right 08/01/2014    Priority: Medium  . GERD 10/16/2007    Priority: Medium  . Mild intermittent asthma 02/07/2007    Priority: Medium  . Chronic periscapular pain on right side 01/01/2018     Priority: Low  . Chronic right-sided low back pain without sciatica 08/01/2014    Priority: Low  . Allergic rhinitis 04/10/2012    Priority: Low  . Current moderate episode of major depressive disorder without prior episode (HCC) 11/18/2020  . Papanicolaou smear of cervix with positive high risk human papilloma virus (HPV) test 12/06/2007   Current Meds  Medication Sig  . EPIPEN 2-PAK 0.3 MG/0.3ML SOAJ injection inject as directed WITH BEE STING  . hydrochlorothiazide (HYDRODIURIL) 25 MG tablet Take 1 tablet (25 mg total) by mouth daily.  . metoprolol succinate (TOPROL-XL) 50 MG 24 hr tablet Take 1 tablet (50 mg total) by mouth daily. Take with or immediately following a meal.  . PULMICORT FLEXHALER 90 MCG/ACT inhaler INHALE 1 PUFF INTO THE LUNGS TWICE DAILY    Allergies: Patient is allergic to bee venom and other. Family history:  Patient family history includes Heart attack in her maternal grandmother; Hypertension in her maternal grandmother; Lung cancer in her maternal aunt. Social History   Socioeconomic History  . Marital status: Married    Spouse name: Not on file  . Number of children: 0  . Years of education: 8  . Highest education level: Not on file  Occupational History  . Occupation: [employer] [place]  Tobacco Use  . Smoking status: Former Smoker    Packs/day: 0.50    Years: 7.00    Pack years: 3.50    Types: Cigarettes    Start date: 2012    Quit date: 2020    Years since quitting: 2.3  . Smokeless tobacco: Never Used  Vaping Use  . Vaping Use: Never used  Substance and Sexual Activity  . Alcohol use: Yes    Alcohol/week: 0.0 standard drinks    Comment: occasional  . Drug use: Not Currently    Types: Marijuana  . Sexual activity: Yes    Partners: Female    Comment: Lesbian  Other Topics Concern  . Not on file  Social History Narrative   Right handed   No children   One story home   Social Determinants of Health   Financial Resource Strain:  Not on file  Food Insecurity: Not on file  Transportation Needs: Not on file  Physical Activity: Not on file  Stress: Not on file  Social Connections: Not on file     Review of Systems: Constitutional: Negative for fever malaise or anorexia Cardiovascular: negative for chest pain Respiratory: negative for SOB or persistent cough Gastrointestinal: negative for abdominal pain  Objective  Vitals: BP 134/90   Pulse 80   Temp 98.1 F (36.7 C) (Temporal)   Resp 16   Ht 5\' 2"  (1.575 m)   Wt 137 lb 3.2 oz (62.2 kg)   SpO2 98%   BMI 25.09 kg/m  General: no acute distress,  well groomed Psych:  Alert and oriented  x 3,depressed and Hypokinetic. flat affect Wt Readings from Last 3 Encounters:  11/18/20 137 lb 3.2 oz (62.2 kg)  10/14/20 141 lb 9.6 oz (64.2 kg)  08/19/19 164 lb 9.6 oz (74.7 kg)      Commons side effects, risks, benefits, and alternatives for medications and treatment plan prescribed today were discussed, and the patient expressed understanding of the given instructions. Patient is instructed to call or message via MyChart if he/she has any questions or concerns regarding our treatment plan. No barriers to understanding were identified. We discussed Red Flag symptoms and signs in detail. Patient expressed understanding regarding what to do in case of urgent or emergency type symptoms.   Medication list was reconciled, printed and provided to the patient in AVS. Patient instructions and summary information was reviewed with the patient as documented in the AVS. This note was prepared with assistance of Dragon voice recognition software. Occasional wrong-word or sound-a-like substitutions may have occurred due to the inherent limitations of voice recognition software

## 2020-11-30 ENCOUNTER — Encounter (HOSPITAL_COMMUNITY): Payer: Self-pay

## 2020-11-30 ENCOUNTER — Other Ambulatory Visit: Payer: Self-pay

## 2020-11-30 ENCOUNTER — Ambulatory Visit (HOSPITAL_COMMUNITY)
Admission: EM | Admit: 2020-11-30 | Discharge: 2020-11-30 | Disposition: A | Discharge status: Home / Self Care | Payer: 59 | Attending: Student | Admitting: Student

## 2020-11-30 DIAGNOSIS — R197 Diarrhea, unspecified: Secondary | ICD-10-CM

## 2020-11-30 DIAGNOSIS — I10 Essential (primary) hypertension: Secondary | ICD-10-CM | POA: Diagnosis not present

## 2020-11-30 MED ORDER — LOPERAMIDE HCL 2 MG PO CAPS: 2.0000 mg | ORAL_CAPSULE | Freq: Four times a day (QID) | ORAL | 0 refills | Status: DC | PRN

## 2020-11-30 NOTE — ED Triage Notes (Signed)
Pt in with c/o diarrhea that started yesterday  Pt states she feels very dehydrated no matter how much water she drinks  Denies any uri sxs

## 2020-11-30 NOTE — Discharge Instructions (Addendum)
-  Take the Imodium (loperamide) up to 4 times daily for diarrhea. -Drink plenty of fluids, like water or Gatorade. -Take your blood pressure medication when you return home. -Seek additional immediate medical attention if you develop new symptoms like dizziness, abdominal pain in 1 place, chest pain, shortness of breath.

## 2020-11-30 NOTE — ED Provider Notes (Signed)
[facility]    CSN: [id number] Arrival date & time: 11/30/20  7989      History   Chief Complaint Chief Complaint  Patient presents with  . Diarrhea    HPI Jessica Hale is a 45 y.o. female presenting with diarrhea.  Medical history GERD, hypetension, migraine headaches.  States she has been having diarrhea for about 26 hours.  Unable to quantify how often this is occurring, but states it is frequent.  Has not had diarrhea in about 2 hours at the time of this visit.  Describes diarrhea as copious, thin, yellow.  Absolutely no blood in diarrhea.  Denies abdominal pain, nausea/vomiting.  Feeling well otherwise, denies URI symptoms.  Denies dizziness, chest pain, shortness of breath.  Denies fever/chills, urinary symptoms.  States she did eat out 2 days ago, but the rest of the party is feeling fine.  Denies sick contacts.  Has not taken any medications for symptoms.  Has not taken her blood pressure medications yet today, denies chest pain, shortness of breath, dizziness  HPI  Past Medical History:  Diagnosis Date  . Anemia   . Asthma   . GERD (gastroesophageal reflux disease)   . Hypertension   . Migraine headache     Patient Active Problem List   Diagnosis Date Noted  . Current moderate episode of major depressive disorder without prior episode (HCC) 11/18/2020  . Cervical radiculopathy 04/20/2018  . Chronic periscapular pain on right side 01/01/2018  . Chronic right-sided low back pain without sciatica 08/01/2014  . Carpal tunnel syndrome on right 08/01/2014  . Meniere's disease (cochlear hydrops) 01/25/2013  . Allergic rhinitis 04/10/2012  . Atypical migraine 05/02/2011  . Papanicolaou smear of cervix with positive high risk human papilloma virus (HPV) test 12/06/2007  . GERD 10/16/2007  . Benign essential HTN 02/07/2007  . Mild intermittent asthma 02/07/2007    Past Surgical History:  Procedure Laterality Date  . KNEE SURGERY     Age 38    OB History    No obstetric history on file.      Home Medications    Prior to Admission medications   Medication Sig Start Date End Date Taking? Authorizing Provider  loperamide (IMODIUM) 2 MG capsule Take 1 capsule (2 mg total) by mouth 4 (four) times daily as needed for diarrhea or loose stools. 11/30/20  Yes Rhys Martini, PA-C  ALPRAZolam Prudy Feeler) 0.5 MG tablet Take 1 tablet (0.5 mg total) by mouth daily as needed for anxiety. 11/18/20   Willow Ora, MD  EPIPEN 2-PAK 0.3 MG/0.3ML SOAJ injection inject as directed WITH BEE STING 12/25/14   [provider]  hydrochlorothiazide (HYDRODIURIL) 25 MG tablet Take 1 tablet (25 mg total) by mouth daily. 09/18/20   Willow Ora, MD  metoprolol succinate (TOPROL-XL) 50 MG 24 hr tablet Take 1 tablet (50 mg total) by mouth daily. Take with or immediately following a meal. 11/03/20   Willow Ora, MD  PULMICORT [facility] 90 MCG/ACT inhaler INHALE 1 PUFF INTO THE LUNGS TWICE DAILY 02/19/20   Willow Ora, MD  sertraline (ZOLOFT) 25 MG tablet Take 1 tablet (25 mg total) by mouth daily for 14 days, THEN 2 tablets (50 mg total) daily for 14 days. 11/18/20 12/16/20  Willow Ora, MD  albuterol (VENTOLIN HFA) 108 (90 Base) MCG/ACT inhaler Inhale 2 puffs into the lungs every 6 (six) hours as needed for wheezing or shortness of breath. Patient not taking: No sig reported 04/11/19  11/30/20  Helane Rima, DO    Family History Family History  Problem Relation Age of Onset  . Heart attack Maternal Grandmother   . Hypertension Maternal Grandmother   . Lung cancer Maternal Aunt   . Colon cancer Neg Hx     Social History Social History   Tobacco Use  . Smoking status: Former Smoker    Packs/day: 0.50    Years: 7.00    Pack years: 3.50    Types: Cigarettes    Start date: 2012    Quit date: 2020    Years since quitting: 2.3  . Smokeless tobacco: Never Used  Vaping Use  . Vaping Use: Never used  Substance Use Topics  . Alcohol use: Yes     Alcohol/week: 0.0 standard drinks    Comment: occasional  . Drug use: Not Currently    Types: Marijuana     Allergies   Bee venom and Other   Review of Systems Review of Systems  Constitutional: Negative for appetite change, chills, diaphoresis, fever and unexpected weight change.  HENT: Negative for congestion, ear pain, sinus pressure, sinus pain, sneezing, sore throat and trouble swallowing.   Respiratory: Negative for cough, chest tightness and shortness of breath.   Cardiovascular: Negative for chest pain.  Gastrointestinal: Positive for diarrhea. Negative for abdominal distention, abdominal pain, anal bleeding, blood in stool, constipation, nausea, rectal pain and vomiting.  Genitourinary: Negative for dysuria, flank pain, frequency and urgency.  Musculoskeletal: Negative for back pain and myalgias.  Neurological: Negative for dizziness, light-headedness and headaches.  All other systems reviewed and are negative.    Physical Exam Triage Vital Signs ED Triage Vitals  Enc Vitals Group     BP 11/30/20 0824 (!) 175/104     Pulse Rate 11/30/20 0824 63     Resp 11/30/20 0824 17     Temp 11/30/20 0824 98.8 F (37.1 C)     Temp Source 11/30/20 0824 Oral     SpO2 11/30/20 0824 99 %     Weight --      Height --      Head Circumference --      Peak Flow --      Pain Score 11/30/20 0823 0     Pain Loc --      Pain Edu? --      Excl. in GC? --    No data found.  Updated Vital Signs BP (!) 175/104   Pulse 63   Temp 98.8 F (37.1 C) (Oral)   Resp 17   LMP 11/10/2020 (Exact Date)   SpO2 99%   Visual Acuity Right Eye Distance:   Left Eye Distance:   Bilateral Distance:    Right Eye Near:   Left Eye Near:    Bilateral Near:     Physical Exam Vitals reviewed.  Constitutional:      General: She is not in acute distress.    Appearance: Normal appearance. She is not ill-appearing.  HENT:     Head: Normocephalic and atraumatic.     Mouth/Throat:     Mouth:  Mucous membranes are moist.     Comments: Moist mucous membranes Eyes:     Extraocular Movements: Extraocular movements intact.     Pupils: Pupils are equal, round, and reactive to light.  Cardiovascular:     Rate and Rhythm: Normal rate and regular rhythm.     Heart sounds: Normal heart sounds.  Pulmonary:     Effort: Pulmonary effort is normal.  Breath sounds: Normal breath sounds. No wheezing, rhonchi or rales.  Abdominal:     General: Bowel sounds are increased. There is no distension.     Palpations: Abdomen is soft. There is no mass.     Tenderness: There is generalized abdominal tenderness. There is no right CVA tenderness, left CVA tenderness, guarding or rebound. Negative signs include Murphy's sign, Rovsing's sign and McBurney's sign.     Comments: Very mild generalized abdominal pain to palpation   Skin:    General: Skin is warm.     Capillary Refill: Capillary refill takes less than 2 seconds.     Comments: Good skin turgor  Neurological:     General: No focal deficit present.     Mental Status: She is alert and oriented to person, place, and time.  Psychiatric:        Mood and Affect: Mood normal.        Behavior: Behavior normal.        Thought Content: Thought content normal.        Judgment: Judgment normal.      UC Treatments / Results  Labs (all labs ordered are listed, but only abnormal results are displayed) Labs Reviewed - No data to display  EKG   Radiology No results found.  Procedures Procedures (including critical care time)  Medications Ordered in UC Medications - No data to display  Initial Impression / Assessment and Plan / UC Course  I have reviewed the triage vital signs and the nursing notes.  Pertinent labs & imaging results that were available during my care of the patient were reviewed by me and considered in my medical decision making (see chart for details).     This patient is a 45 year old female presenting with viral  gastroenteritis vs food poisoning.  She is afebrile, nontachycardic, nontachypneic, oxygenating well on room air.  Generalized crampy abdominal pain.  Appears well hydrated.  Imodium, rec good hydration, brat diet.  For hypertension, continue current regimen.  She has not taken her medications yet today.  ED return precautions discussed.  Final Clinical Impressions(s) / UC Diagnoses   Final diagnoses:  Diarrhea in adult patient  Essential hypertension     Discharge Instructions     -Take the Imodium (loperamide) up to 4 times daily for diarrhea. -Drink plenty of fluids, like water or Gatorade. -Take your blood pressure medication when you return home. -Seek additional immediate medical attention if you develop new symptoms like dizziness, abdominal pain in 1 place, chest pain, shortness of breath.    ED Prescriptions    Medication Sig Dispense Auth. Provider   loperamide (IMODIUM) 2 MG capsule Take 1 capsule (2 mg total) by mouth 4 (four) times daily as needed for diarrhea or loose stools. 12 capsule Rhys Martini, PA-C     PDMP not reviewed this encounter.   Rhys Martini, PA-C 11/30/20 [phone]

## 2020-12-02 ENCOUNTER — Other Ambulatory Visit: Payer: Self-pay | Admitting: Family Medicine

## 2020-12-02 MED ORDER — CLONAZEPAM 0.5 MG PO TABS: 0.5000 mg | ORAL_TABLET | Freq: Two times a day (BID) | ORAL | 1 refills | Status: DC | PRN

## 2020-12-02 NOTE — Telephone Encounter (Signed)
Pt requesting refill of xanax: ordered 5/4, #15.  Please call her. Will change to longer acting nerve medication called klonopin. Will stop xanax.  Ordered; may take as needed.   F/u as scheduled.

## 2020-12-02 NOTE — Telephone Encounter (Signed)
Spoke with patient regarding medication change, gave a verbal understanding

## 2020-12-29 ENCOUNTER — Other Ambulatory Visit: Payer: Self-pay | Admitting: Family Medicine

## 2020-12-29 DIAGNOSIS — I10 Essential (primary) hypertension: Secondary | ICD-10-CM

## 2021-01-01 ENCOUNTER — Other Ambulatory Visit: Payer: Self-pay | Admitting: Family Medicine

## 2021-01-01 NOTE — Telephone Encounter (Signed)
Last Refill 12/02/2020 Last OV 11/18/2020 dx depressive episode

## 2021-02-09 ENCOUNTER — Telehealth (INDEPENDENT_AMBULATORY_CARE_PROVIDER_SITE_OTHER): Payer: 59 | Admitting: Family Medicine

## 2021-02-09 ENCOUNTER — Encounter: Payer: Self-pay | Admitting: Family Medicine

## 2021-02-09 DIAGNOSIS — R197 Diarrhea, unspecified: Secondary | ICD-10-CM

## 2021-02-09 NOTE — Progress Notes (Signed)
Virtual Visit via Video Note  I connected with Jessica Hale  on 02/09/21 at  4:20 PM EDT by a video enabled telemedicine application and verified that I am speaking with the correct person using two identifiers.  Location patient: home, [place] Location provider:work or home office Persons participating in the virtual visit: patient, provider  I discussed the limitations of evaluation and management by telemedicine and the availability of in person appointments. The patient expressed understanding and agreed to proceed.   HPI:  Acute telemedicine visit for Diarrhea: -Onset: diarrhea -Symptoms include: watery diarrhea initially, now improving -Denies:fevers, vomiting, abd pain, melena, hematochezia, respiratory symptoms, known sick contacts, recent travel or abx -Has tried: imodium -Pertinent past medical history:see below -Pertinent medication allergies:  Allergies  Allergen Reactions   Bee Venom Anaphylaxis   Other Anaphylaxis  -COVID-19 vaccine status: not vaccinated -denies any chance of pregnancy  ROS: See pertinent positives and negatives per HPI.  Past Medical History:  Diagnosis Date   Anemia    Asthma    GERD (gastroesophageal reflux disease)    Hypertension    Migraine headache     Past Surgical History:  Procedure Laterality Date   KNEE SURGERY     Age 61     Current Outpatient Medications:    clonazePAM (KLONOPIN) 0.5 MG tablet, TAKE 1 TABLET(0.5 MG) BY MOUTH TWICE DAILY AS NEEDED FOR ANXIETY, Disp: 30 tablet, Rfl: 0   EPIPEN 2-PAK 0.3 MG/0.3ML SOAJ injection, inject as directed WITH BEE STING, Disp: , Rfl: 0   hydrochlorothiazide (HYDRODIURIL) 25 MG tablet, TAKE 1 TABLET(25 MG) BY MOUTH DAILY, Disp: 90 tablet, Rfl: 0   loperamide (IMODIUM) 2 MG capsule, Take 1 capsule (2 mg total) by mouth 4 (four) times daily as needed for diarrhea or loose stools., Disp: 12 capsule, Rfl: 0   metoprolol succinate (TOPROL-XL) 50 MG 24 hr tablet, Take 1 tablet (50 mg total) by mouth  daily. Take with or immediately following a meal., Disp: 90 tablet, Rfl: 1  EXAM:  VITALS per patient if applicable:  GENERAL: alert, oriented, appears well and in no acute distress  HEENT: atraumatic, conjunttiva clear, no obvious abnormalities on inspection of external nose and ears  NECK: normal movements of the head and neck  LUNGS: on inspection no signs of respiratory distress, breathing rate appears normal, no obvious gross SOB, gasping or wheezing  CV: no obvious cyanosis  MS: moves all visible extremities without noticeable abnormality  PSYCH/NEURO: pleasant and cooperative, no obvious depression or anxiety, speech and thought processing grossly intact  ASSESSMENT AND PLAN:  Discussed the following assessment and plan:  Diarrhea, unspecified type  -we discussed possible serious and likely etiologies, options for evaluation and workup, limitations of telemedicine visit vs in person visit, treatment, treatment risks and precautions. Pt prefers to treat via telemedicine empirically rather than in person at this moment. Query gastroenteritis vs other. She reports stools are thickening and had less symptoms today. Opted for bland diet, no dairy or red meat until better and imodium if needed. Also discussed possibility of covid and options for testing.  Work/School slipped offered: provided in patient instructions   Advised to seek prompt in person care if worsening, new symptoms arise, or if is not improving with treatment. Discussed options for inperson care if PCP office not available. Did let this patient know that I only do telemedicine on Tuesdays and Thursdays for Hale. Advised to schedule follow up visit with PCP or UCC if any further questions or concerns to  avoid delays in care.   I discussed the assessment and treatment plan with the patient. The patient was provided an opportunity to ask questions and all were answered. The patient agreed with the plan and demonstrated  an understanding of the instructions.     Terressa Koyanagi, DO

## 2021-02-09 NOTE — Patient Instructions (Signed)
   ---------------------------------------------------------------------------------------------------------------------------      WORK SLIP:  Patient Jessica Hale,  08-Aug-1975, was seen for a medical visit today, 02/09/21 . Please excuse from work until symptoms have resolved for 24 hours and patient has a negative covid test.  Sincerely: E-signature: Dr. Kriste Basque, DO [facility] Ph: [phone]   ------------------------------------------------------------------------------------------------------------------------------  Imodium as needed  Drink plenty of water  Avoid dairy and red meat for about 1-2 weeks  I hope you are feeling all better soon!  Seek in person care promptly if your symptoms worsen, new concerns arise or you are not improving with treatment.  It was nice to meet you today. I help [facility] out with telemedicine visits on Tuesdays and Thursdays and am available for visits on those days. If you have any concerns or questions following this visit please schedule a follow up visit with your Primary Care doctor or seek care at a local urgent care clinic to avoid delays in care.

## 2021-03-09 ENCOUNTER — Other Ambulatory Visit: Payer: Self-pay | Admitting: Family Medicine

## 2021-03-09 NOTE — Telephone Encounter (Signed)
Last refill: 01/01/21 #30, 0 Last OV: 11/18/20 dx. depression

## 2021-04-16 ENCOUNTER — Other Ambulatory Visit: Payer: Self-pay | Admitting: Family Medicine

## 2021-04-16 DIAGNOSIS — I10 Essential (primary) hypertension: Secondary | ICD-10-CM

## 2021-04-21 IMAGING — MR MR LUMBAR SPINE W/O CM
4 of 5 series · 25 of 48 positions shown · non-contrast
Comparison: None.

CLINICAL DATA: Chronic low back pain with bilateral foot numbness
and tingling, worsening over the past 10 years.

EXAM:
MRI LUMBAR SPINE WITHOUT CONTRAST
TECHNIQUE: Multiplanar, multisequence MR imaging of the lumbar spine was
performed. No intravenous contrast was administered.

[Series 2: T2 · sagittal · 4.0mm · 0.55mm/px · 6 of 16 slices shown (1 of 2)]
[im 1/16]
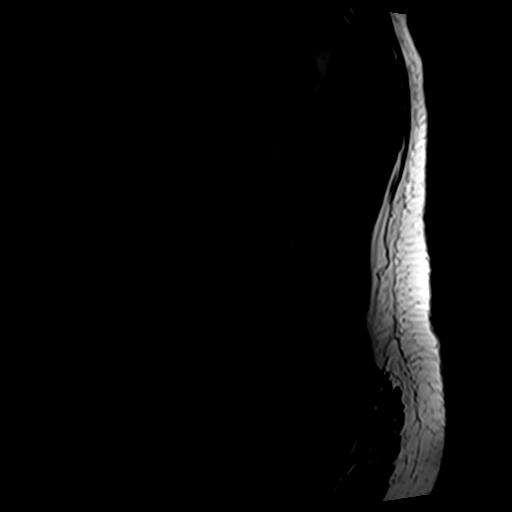
[im 4/16]
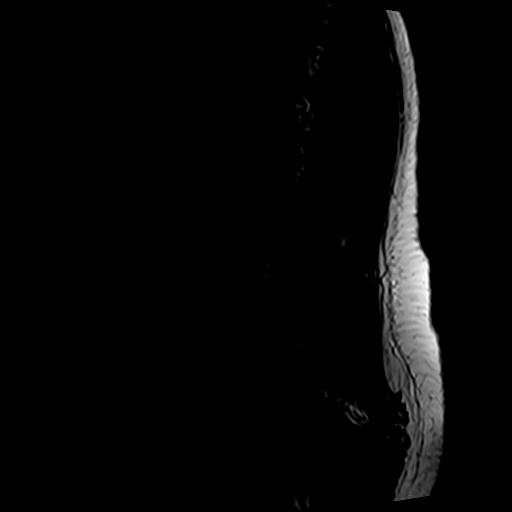
[im 7/16]
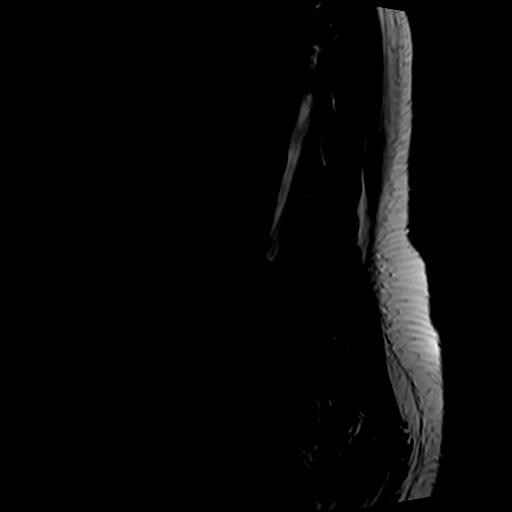
[im 10/16]
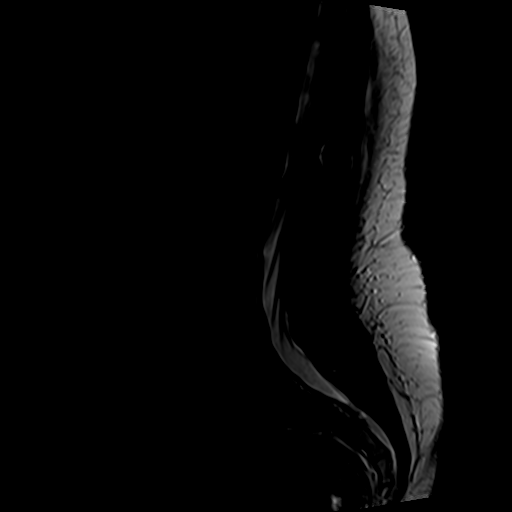
[im 13/16]
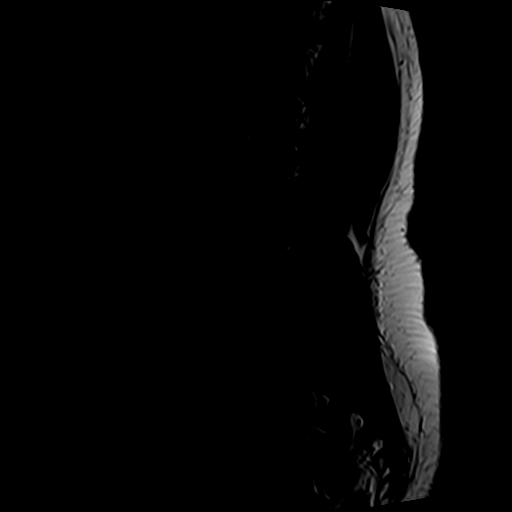
[im 16/16]
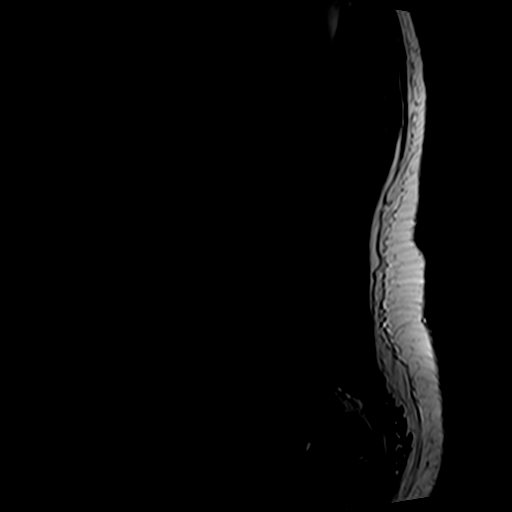

[Series 4: T1 · sagittal · 4.0mm · 0.55mm/px · 7 of 16 slices shown (1 of 2)]
[im 1/16]
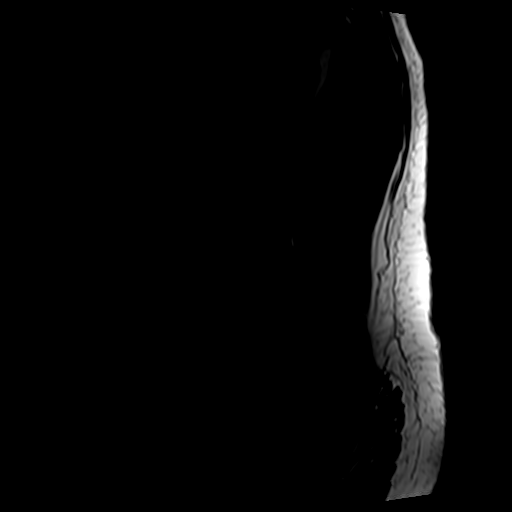
[im 3/16]
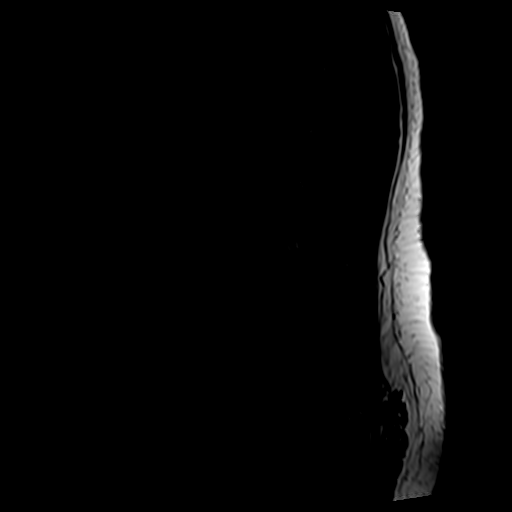
[im 6/16]
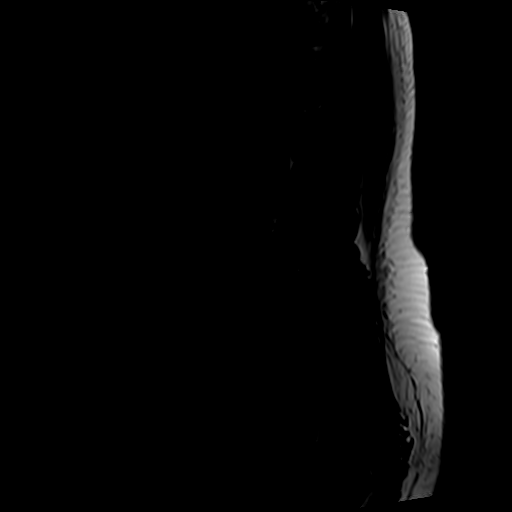
[im 8/16]
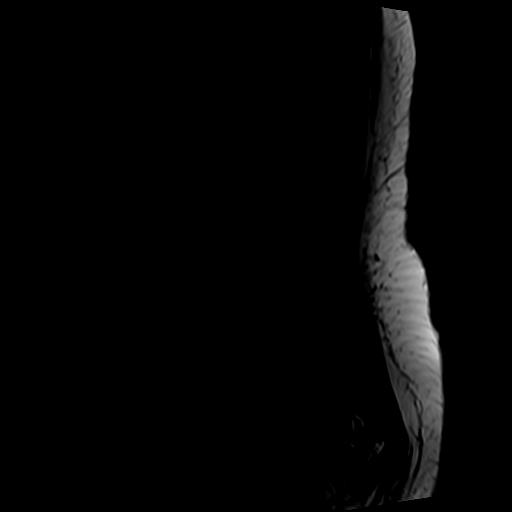
[im 11/16]
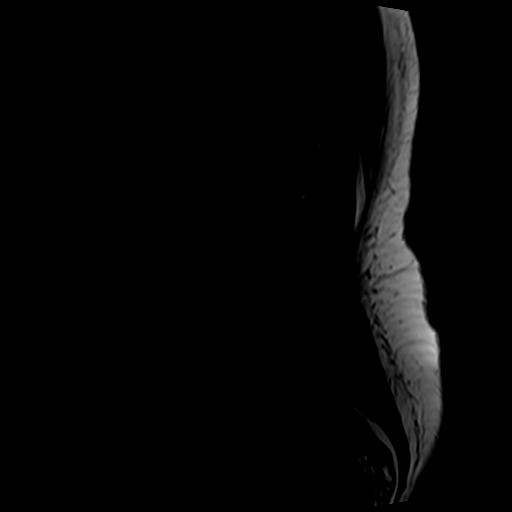
[im 13/16]
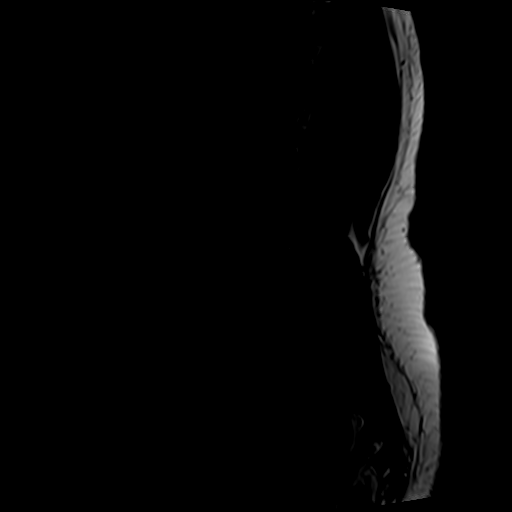
[im 16/16]
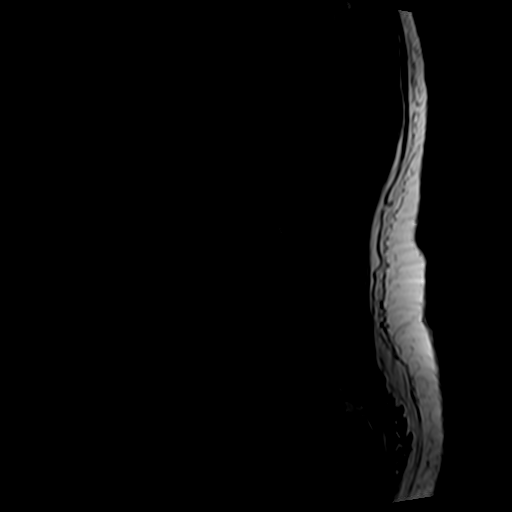

[Series 5: T2 · axial · 4.0mm · 0.70mm/px · z∈[-79,+124]mm · 8 of 35 slices shown (2 of 2)]
[im 1/35]
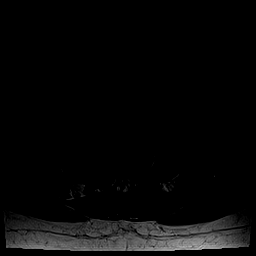
[im 6/35]
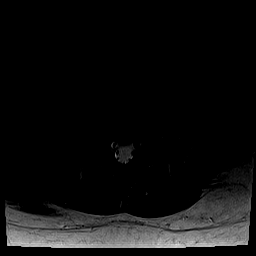
[im 11/35]
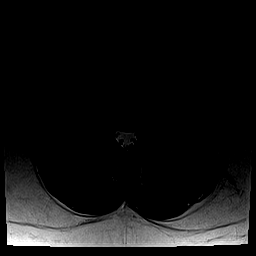
[im 16/35]
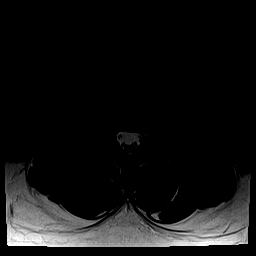
[im 19/35]
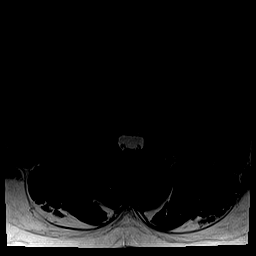
[im 24/35]
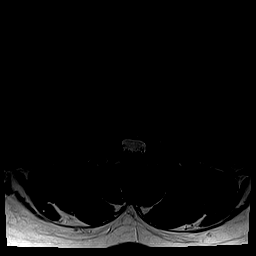
[im 29/35]
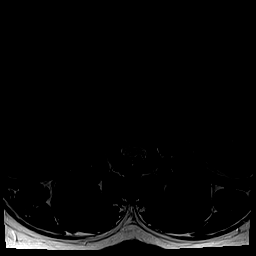
[im 35/35]
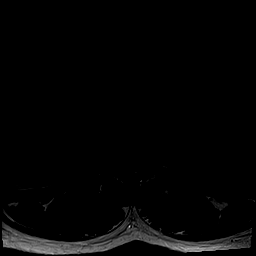

[Series 6: T1 · axial · 4.0mm · 0.35mm/px · z∈[-79,+93]mm · 4 of 35 slices shown (2 of 2)]
[im 1/35]
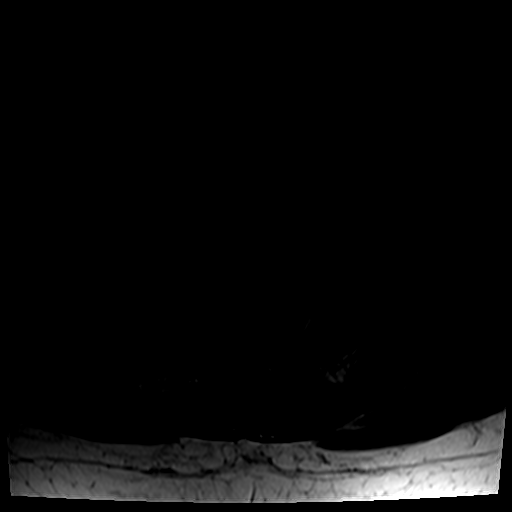
[im 6/35]
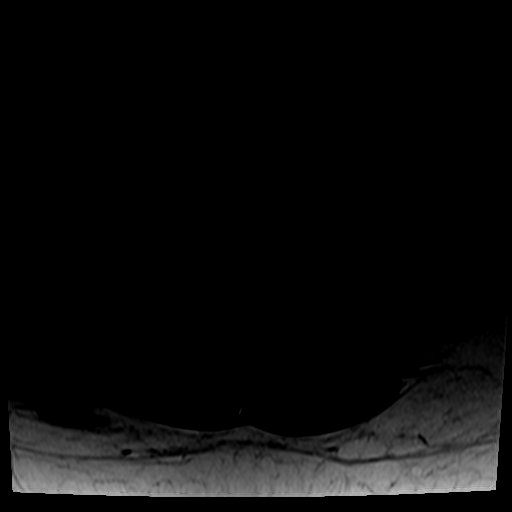
[im 19/35]
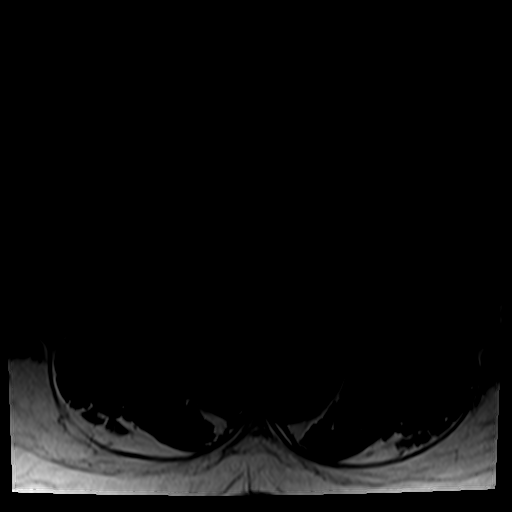
[im 29/35]
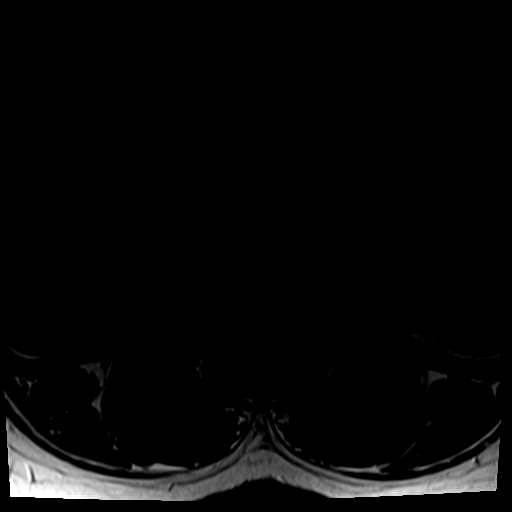

[25 of 48 positions shown; findings below may reference images not displayed]

FINDINGS: Segmentation:  Partial sacralization of L5.

Alignment:  Physiologic.

Vertebrae:  No fracture, evidence of discitis, or bone lesion.

Conus medullaris and cauda equina: Conus extends to the L1 level.
Conus and cauda equina appear normal.

Paraspinal and other soft tissues: Negative.

Disc levels:

T11-T12 to L3-L4: Negative.

L4-L5: Mild disc desiccation with small shallow broad-based
posterior disc protrusion. Mild bilateral facet arthropathy.
Borderline mild bilateral neuroforaminal stenosis. No spinal canal
stenosis.

L5-S1: Negative disc. Mild bilateral facet arthropathy. No stenosis.
IMPRESSION: 1. Mild spondylosis at L4-L5.  No stenosis or impingement.

## 2021-06-01 ENCOUNTER — Other Ambulatory Visit: Payer: Self-pay | Admitting: Family Medicine

## 2021-06-07 ENCOUNTER — Encounter: Payer: Self-pay | Admitting: Family

## 2021-06-07 ENCOUNTER — Telehealth (INDEPENDENT_AMBULATORY_CARE_PROVIDER_SITE_OTHER): Payer: 59 | Admitting: Family

## 2021-06-07 VITALS — Ht 62.0 in | Wt 137.1 lb

## 2021-06-07 DIAGNOSIS — J029 Acute pharyngitis, unspecified: Secondary | ICD-10-CM | POA: Insufficient documentation

## 2021-06-07 NOTE — Progress Notes (Signed)
MyChart Video Visit    Virtual Visit via Video Note   This visit type was conducted due to national recommendations for restrictions regarding the COVID-19 Pandemic (e.g. social distancing) in an effort to limit this patient's exposure and mitigate transmission in our community. This patient is at least at moderate risk for complications without adequate follow up. This format is felt to be most appropriate for this patient at this time. Physical exam was limited by quality of the video and audio technology used for the visit. CMA was able to get the patient set up on a video visit.  Patient location: Home. Patient and provider in visit Provider location: Office  I discussed the limitations of evaluation and management by telemedicine and the availability of in person appointments. The patient expressed understanding and agreed to proceed.  Visit Date: 06/07/2021  Today's healthcare provider: Dulce Sellar, NP     Subjective:    Patient ID: Jessica Hale, female    DOB: Jun 14, 1976, 45 y.o.   MRN: [id number]  Chief Complaint  Patient presents with   Sore Throat    Started Saturday. She has drank hot tea, and gargled salt water. It relieved symptoms then, but has since come back.     Nasal Congestion    Complains of chest discomfort, but specifies not pain.    HPI Sore Throat: Patient complains of sore throat. Associated symptoms include nasal blockage, sinus and nasal congestion, and sore throat.Onset of symptoms was 2 days ago, unchanged since that time. She is drinking plenty of fluids. She has not had recent close exposure to someone with proven streptococcal pharyngitis.   Past Medical History:  Diagnosis Date   Anemia    Asthma    GERD (gastroesophageal reflux disease)    Hypertension    Migraine headache     Past Surgical History:  Procedure Laterality Date   KNEE SURGERY     Age 74    Outpatient Medications Prior to Visit  Medication Sig Dispense  Refill   EPIPEN 2-PAK 0.3 MG/0.3ML SOAJ injection inject as directed WITH BEE STING  0   hydrochlorothiazide (HYDRODIURIL) 25 MG tablet TAKE 1 TABLET(25 MG) BY MOUTH DAILY 90 tablet 0   metoprolol succinate (TOPROL-XL) 50 MG 24 hr tablet TAKE 1 TABLET(50 MG) BY MOUTH DAILY WITH OR IMMEDIATELY FOLLOWING A MEAL 90 tablet 1   clonazePAM (KLONOPIN) 0.5 MG tablet TAKE 1 TABLET(0.5 MG) BY MOUTH TWICE DAILY AS NEEDED FOR ANXIETY 30 tablet 0   loperamide (IMODIUM) 2 MG capsule Take 1 capsule (2 mg total) by mouth 4 (four) times daily as needed for diarrhea or loose stools. 12 capsule 0   No facility-administered medications prior to visit.    Allergies  Allergen Reactions   Bee Venom Anaphylaxis   Other Anaphylaxis        Objective:     Physical Exam Vitals and nursing note reviewed.  Constitutional:      General: She is not in acute distress.    Appearance: Normal appearance.  HENT:     Head: Normocephalic.  Pulmonary:     Effort: No respiratory distress.  Musculoskeletal:     Cervical back: Normal range of motion.  Skin:    General: Skin is dry.     Coloration: Skin is not pale.  Neurological:     Mental Status: She is alert and oriented to person, place, and time.  Psychiatric:        Mood and Affect: Mood normal.  Ht 5\' 2"  (1.575 m)   Wt 137 lb 2 oz (62.2 kg)   BMI 25.08 kg/m   Wt Readings from Last 3 Encounters:  06/07/21 137 lb 2 oz (62.2 kg)  11/18/20 137 lb 3.2 oz (62.2 kg)  10/14/20 141 lb 9.6 oz (64.2 kg)       Assessment & Plan:   Problem List Items Addressed This Visit       Respiratory   Pharyngitis - Primary    Advised to continue warm salt water gargles, take up to 600mg  Ibuprofen tid, drink plenty of water, ok to take generic sudafed bid for 3-5d if sinus sx develop.       No orders of the defined types were placed in this encounter.   I discussed the assessment and treatment plan with the patient. The patient was provided an opportunity to  ask questions and all were answered. The patient agreed with the plan and demonstrated an understanding of the instructions.   The patient was advised to call back or seek an in-person evaluation if the symptoms worsen or if the condition fails to improve as anticipated.  I provided 20 minutes of face-to-face time during this encounter.   10/16/20, NP [facility] [place] [phone] (phone) [phone] (fax)  [facility]

## 2021-06-07 NOTE — Assessment & Plan Note (Signed)
Advised to continue warm salt water gargles, take up to 600mg  Ibuprofen tid, drink plenty of water, ok to take generic sudafed bid for 3-5d if sinus sx develop.

## 2021-06-30 ENCOUNTER — Encounter: Payer: Self-pay | Admitting: Family Medicine

## 2021-07-07 ENCOUNTER — Other Ambulatory Visit: Payer: Self-pay

## 2021-07-07 ENCOUNTER — Telehealth (INDEPENDENT_AMBULATORY_CARE_PROVIDER_SITE_OTHER): Payer: 59 | Admitting: Family Medicine

## 2021-07-07 ENCOUNTER — Encounter: Payer: Self-pay | Admitting: Family Medicine

## 2021-07-07 VITALS — BP 130/74

## 2021-07-07 DIAGNOSIS — F321 Major depressive disorder, single episode, moderate: Secondary | ICD-10-CM | POA: Diagnosis not present

## 2021-07-07 DIAGNOSIS — F4322 Adjustment disorder with anxiety: Secondary | ICD-10-CM

## 2021-07-07 DIAGNOSIS — I10 Essential (primary) hypertension: Secondary | ICD-10-CM

## 2021-07-07 MED ORDER — HYDROCHLOROTHIAZIDE 25 MG PO TABS
25.0000 mg | ORAL_TABLET | Freq: Every day | ORAL | 0 refills | Status: DC
Start: 2021-07-07 — End: 2021-09-29

## 2021-07-07 NOTE — Progress Notes (Signed)
Virtual Visit via Video Note  SUBJECTIVE CC:  Chief Complaint  Patient presents with   Depression    Patient is not taking medications, is adjusting to being off meds. Does not want to take any medications    I connected with Leocadia Idleman Iwanicki on 07/07/21 at  2:30 PM EST by a video enabled telemedicine application and verified that I am speaking with the correct person using two identifiers. Location patient: Home Location provider: [facility] Persons participating in the virtual visit: FAE BLOSSOM, Willow Ora, MD Jolyne Loa, CMA  I discussed the limitations of evaluation and management by telemedicine and the availability of in person appointments. The patient expressed understanding and agreed to proceed.  HPI: Jessica Hale is a 45 y.o. female who was contacted today to address the problems listed above in the chief complaint/mood. 45 year old female with depression and adjustment anxiety due to the dissolution of her marriage and several familial losses that started earlier this year.  She was seen in March and May for these problems.  In May we started Zoloft and Xanax due to panic attacks, depression and anxiety.  She reports she took the medications for 3 months but then stopped them because she does not want to be on medication.  Fortunately, she continues with a therapist and she says this is extremely helpful.  She feels that she is doing much better, no longer having panic attacks, however her anxiety and mood are limiting her ability to focus at work.  She drives a truck for delivery service and feels that when she is not doing well emotionally, she is not a safe driver.  She is requesting intermittent FMLA or work restrictions because of this. Her depression and anxiety screen below remain very elevated. No suicidality  Depression screen [facility] 2/9 07/07/2021 10/14/2020 05/06/2019  Decreased Interest 2 2 0  Down, Depressed, Hopeless 2 2 0   PHQ - 2 Score 4 4 0  Altered sleeping 2 2 2   Tired, decreased energy 2 2 1   Change in appetite 3 3 1   Feeling bad or failure about yourself  1 2 0  Trouble concentrating 3 2 1   Moving slowly or fidgety/restless 0 2 1  Suicidal thoughts 0 0 0  PHQ-9 Score 15 17 6   Difficult doing work/chores Somewhat difficult Very difficult Somewhat difficult  Some recent data might be hidden   GAD 7 : Generalized Anxiety Score 07/07/2021 11/18/2020 01/11/2019  Nervous, Anxious, on Edge 3 2 2   Control/stop worrying 2 2 3   Worry too much - different things 2 2 3   Trouble relaxing 2 2 3   Restless 1 2 1   Easily annoyed or irritable 2 2 3   Afraid - awful might happen 3 2 3   Total GAD 7 Score 15 14 18   Anxiety Difficulty Very difficult Very difficult Extremely difficult   Hypertension: She reports she checks her blood pressures intermittently at work.  They are typically normal.  She is on HCTZ 25 and metoprolol 50 mg daily.  She is overdue for blood pressure recheck in the office. Health maintenance: She is due for a physical  ASSESSMENT 1. Current moderate episode of major depressive disorder without prior episode (HCC)   2. Adjustment disorder with anxiety   3. Essential hypertension     Depression/anxiety: Counseling done.  Happy that she continues with therapy, however I am concerned that she remains significantly symptomatic, so much so that  she needs work restrictions.  Educated.  We will complete intermittent FMLA paperwork.  However I recommend closer follow-up.  Recommend recheck in 3 months.  I encouraged her to start medication again if symptoms or not improving. Hypertension: Reportedly well controlled.  Refill HCTZ 25.  Follow-up in office in 3 months  I discussed the assessment and treatment plan with the patient. The patient was provided an opportunity to ask questions and all were answered. The patient agreed with the plan and demonstrated an understanding of the instructions.   The  patient was advised to call back or seek an in-person evaluation if the symptoms worsen or if the condition fails to improve as anticipated. Follow up: 3 months for complete physical, follow-up mood and blood pressure Visit date not found  Meds ordered this encounter  Medications   hydrochlorothiazide (HYDRODIURIL) 25 MG tablet    Sig: Take 1 tablet (25 mg total) by mouth daily.    Dispense:  90 tablet    Refill:  0      I reviewed the patients updated PMH, FH, and SocHx.    Patient Active Problem List   Diagnosis Date Noted   Cervical radiculopathy 04/20/2018    Priority: High   Meniere's disease (cochlear hydrops) 01/25/2013    Priority: High   Atypical migraine 05/02/2011    Priority: High   Essential hypertension 02/07/2007    Priority: High   Carpal tunnel syndrome on right 08/01/2014    Priority: Medium    GERD 10/16/2007    Priority: Medium    Mild intermittent asthma 02/07/2007    Priority: Medium    Chronic periscapular pain on right side 01/01/2018    Priority: Low   Chronic right-sided low back pain without sciatica 08/01/2014    Priority: Low   Allergic rhinitis 04/10/2012    Priority: Low   Adjustment disorder with anxiety 07/07/2021   Current moderate episode of major depressive disorder without prior episode (HCC) 11/18/2020   Papanicolaou smear of cervix with positive high risk human papilloma virus (HPV) test 12/06/2007   Current Meds  Medication Sig   EPIPEN 2-PAK 0.3 MG/0.3ML SOAJ injection inject as directed WITH BEE STING   metoprolol succinate (TOPROL-XL) 50 MG 24 hr tablet TAKE 1 TABLET(50 MG) BY MOUTH DAILY WITH OR IMMEDIATELY FOLLOWING A MEAL   [DISCONTINUED] hydrochlorothiazide (HYDRODIURIL) 25 MG tablet TAKE 1 TABLET(25 MG) BY MOUTH DAILY    Allergies: Patient is allergic to bee venom and other. Family History: Patient family history includes Heart attack in her maternal grandmother; Hypertension in her maternal grandmother; Lung cancer in  her maternal aunt. Social History:  Patient  reports that she quit smoking about 2 years ago. Her smoking use included cigarettes. She started smoking about 10 years ago. She has a 3.50 pack-year smoking history. She has never used smokeless tobacco. She reports current alcohol use. She reports that she does not currently use drugs after having used the following drugs: Marijuana.  Review of Systems: Constitutional: Negative for fever malaise or anorexia Cardiovascular: negative for chest pain Respiratory: negative for SOB or persistent cough Gastrointestinal: negative for abdominal pain  OBJECTIVE/OBSERVATIONS: General: no acute distress, well appearing, no apparent distress, well groomed Psych:  Alert and oriented x 3,normal mood, behavior, speech, dress, and thought processes.   Willow Ora, MD

## 2021-07-13 ENCOUNTER — Encounter: Payer: Self-pay | Admitting: Family Medicine

## 2021-08-10 ENCOUNTER — Encounter: Payer: Self-pay | Admitting: Family Medicine

## 2021-08-13 ENCOUNTER — Telehealth: Payer: Self-pay

## 2021-08-13 NOTE — Telephone Encounter (Signed)
Type of form received:FMLA  Form should be Faxed to: [phone] - Medical Services FMLA   Form placed: Mardelle Matte folder

## 2021-08-18 NOTE — Telephone Encounter (Signed)
Please advise 

## 2021-08-19 NOTE — Telephone Encounter (Signed)
LVM for patient to let her know we have faxed paperwork.

## 2021-08-20 NOTE — Telephone Encounter (Signed)
error 

## 2021-08-30 ENCOUNTER — Ambulatory Visit: Payer: 59 | Admitting: Family Medicine

## 2021-09-29 ENCOUNTER — Encounter: Payer: Self-pay | Admitting: Family Medicine

## 2021-09-29 ENCOUNTER — Ambulatory Visit (INDEPENDENT_AMBULATORY_CARE_PROVIDER_SITE_OTHER): Payer: 59 | Admitting: Family Medicine

## 2021-09-29 VITALS — BP 136/88 | HR 92 | Temp 98.4°F | Ht 62.0 in | Wt 145.2 lb

## 2021-09-29 DIAGNOSIS — I10 Essential (primary) hypertension: Secondary | ICD-10-CM

## 2021-09-29 DIAGNOSIS — Z23 Encounter for immunization: Secondary | ICD-10-CM | POA: Diagnosis not present

## 2021-09-29 DIAGNOSIS — F4323 Adjustment disorder with mixed anxiety and depressed mood: Secondary | ICD-10-CM | POA: Diagnosis not present

## 2021-09-29 DIAGNOSIS — Z0000 Encounter for general adult medical examination without abnormal findings: Secondary | ICD-10-CM

## 2021-09-29 MED ORDER — HYDROCHLOROTHIAZIDE 25 MG PO TABS: 25.0000 mg | ORAL_TABLET | Freq: Every day | ORAL | 0 refills | Status: DC

## 2021-09-29 NOTE — Patient Instructions (Signed)
Please return in 6 months for hypertension follow up.  ? ?I will release your lab results to you on your MyChart account with further instructions. You may see the results before I do, but when I review them I will send you a message with my report or have my assistant call you if things need to be discussed. Please reply to my message with any questions. Thank you!  ? ?If you have any questions or concerns, please don't hesitate to send me a message via MyChart or call the office at [phone]. Thank you for visiting with [place] today! It's our pleasure caring for you.  ? ?Please do these things to maintain good health! ? ?Exercise at least 30-45 minutes a day,  4-5 days a week.  ?Eat a low-fat diet with lots of fruits and vegetables, up to 7-9 servings per day. ?Drink plenty of water daily. Try to drink 8 8oz glasses per day. ?Seatbelts can save your life. Always wear your seatbelt. ?Place Smoke Detectors on every level of your home and check batteries every year. ?Schedule an appointment with an eye doctor for an eye exam every 1-2 years ?Safe sex - use condoms to protect yourself from STDs if you could be exposed to these types of infections. Use birth control if you do not want to become pregnant and are sexually active. ?Avoid heavy alcohol use. If you drink, keep it to less than 2 drinks/day and not every day. ?Health Care Power of Attorney.  Choose someone you trust that could speak for you if you became unable to speak for yourself. ?Depression is common in our stressful world.If you're feeling down or losing interest in things you normally enjoy, please come in for a visit. ?If anyone is threatening or hurting you, please get help. Physical or Emotional Violence is never OK.   ?

## 2021-09-29 NOTE — Progress Notes (Signed)
?Subjective  ?Chief Complaint  ?Patient presents with  ? Hypertension  ? Annual Exam  ?  Pt is not fasting  ? Depression  ? ? ?HPI: Jessica Hale is a 46 y.o. female who presents to [facility] today for a Female Wellness Visit. She also has the concerns and/or needs as listed above in the chief complaint. These will be addressed in addition to the Health Maintenance Visit.  ? ?Wellness Visit: annual visit with health maintenance review and exam without Pap ? ?Health maintenance: Pap smear up-to-date.  Discussed screening recommendations for mammogram.  Patient defers for now.  Eligible for Tdap.  Defers flu shot and COVID vaccinations. ?Chronic disease f/u and/or acute problem visit: (deemed necessary to be done in addition to the wellness visit): ?Depression/adjustment disorder: Fortunately, patient is much improved.  Working with a Transport planner, meditation, exercising and has moved towards acceptance.  She will be following for divorce soon.  Feels good about things overall.  Functioning better at work.  Continues to have primary insomnia but this is not bothersome to her.  Using melatonin as needed.  Overall feeling well and herself again. ?Hypertension on metoprolol and HCTZ: No chest pain, shortness of breath, lower extremity edema or palpitations.  Takes medications regularly.  Blood pressures been well controlled.  Nonfasting for lab work today. ? ?Assessment  ?1. Annual physical exam   ?2. Essential hypertension   ?3. Adjustment reaction with anxiety and depression   ?4. Need for Tdap vaccination   ? ?  ?Plan  ?Female Wellness Visit: ?Age appropriate Health Maintenance and Prevention measures were discussed with patient. Included topics are cancer screening recommendations, ways to keep healthy (see AVS) including dietary and exercise recommendations, regular eye and dental care, use of seat belts, and avoidance of moderate alcohol use and tobacco use.  Due for screening  mammogram.  We will discuss again next year.  Pap smear current ?BMI: discussed patient's BMI and encouraged positive lifestyle modifications to help get to or maintain a target BMI. ?HM needs and immunizations were addressed and ordered. See below for orders. See HM and immunization section for updates.  Tdap updated ?Routine labs and screening tests ordered including cmp, cbc and lipids where appropriate. ?Discussed recommendations regarding Vit D and calcium supplementation (see AVS) ? ?Chronic disease management visit and/or acute problem visit: ?Adjustment reaction: Much improved.  Continue behavioral management strategies.  Continue therapy.  Praised ?Hypertension is well controlled: Continue metoprolol and HCTZ.  Check renal function electrolytes and lipids. ? ?Follow up:  6 months to recheck blood pressure and mood ?Orders Placed This Encounter  ?Procedures  ? Tdap vaccine greater than or equal to 7yo IM  ? Hepatitis C Antibody  ? CBC with Differential/Platelet  ? Comprehensive metabolic panel  ? Lipid panel  ? TSH  ? ?Meds ordered this encounter  ?Medications  ? hydrochlorothiazide (HYDRODIURIL) 25 MG tablet  ?  Sig: Take 1 tablet (25 mg total) by mouth daily.  ?  Dispense:  90 tablet  ?  Refill:  0  ? ?  ? ?Body mass index is 26.56 kg/m?. ?Wt Readings from Last 3 Encounters:  ?09/29/21 145 lb 3.2 oz (65.9 kg)  ?06/07/21 137 lb 2 oz (62.2 kg)  ?11/18/20 137 lb 3.2 oz (62.2 kg)  ? ? ? ?Patient Active Problem List  ? Diagnosis Date Noted  ? Cervical radiculopathy 04/20/2018  ?  Priority: High  ?  MRI C-spine 03/31/18 ?IMPRESSION: ?1. Small  C5-6 and C6-7 disc protrusions with annular fissures. No ?fracture or malalignment. ?2. Mild canal stenosis C5-6.  No neural foraminal narrowing. ? ? ?  ? Meniere's disease (cochlear hydrops) 01/25/2013  ?  Priority: High  ? Atypical migraine 05/02/2011  ?  Priority: High  ? Essential hypertension 02/07/2007  ?  Priority: High  ? Carpal tunnel syndrome on right 08/01/2014  ?   Priority: Medium   ?  B corticosteroid injection 08/17/2018 ?  ? GERD 10/16/2007  ?  Priority: Medium   ? Mild intermittent asthma 02/07/2007  ?  Priority: Medium   ? Chronic periscapular pain on right side 01/01/2018  ?  Priority: Low  ?  Intermittent. Works job in Architect. ?  ? Chronic right-sided low back pain without sciatica 08/01/2014  ?  Priority: Low  ? Allergic rhinitis 04/10/2012  ?  Priority: Low  ? Adjustment disorder with anxiety 07/07/2021  ?  Separation / divorce 2021-2023; counseling and behavioral mgt.  ?  ? ?Health Maintenance  ?Topic Date Due  ? Hepatitis C Screening  Never done  ? PAP SMEAR-Modifier  08/18/2023  ? TETANUS/TDAP  09/30/2031  ? HIV Screening  Completed  ? HPV VACCINES  Aged Out  ? INFLUENZA VACCINE  Discontinued  ? COVID-19 Vaccine  Discontinued  ? ?Immunization History  ?Administered Date(s) Administered  ? Influenza Split 04/28/2011  ? Influenza Whole 08/05/2009  ? Influenza,inj,Quad PF,6+ Mos 04/16/2014, 08/19/2015, 06/02/2017, 04/20/2018, 05/06/2019  ? Influenza-Unspecified 04/20/2018, 03/01/2019  ? Pneumococcal Polysaccharide-23 04/17/2001  ? Td 03/18/1997, 08/05/2009  ? Tdap 09/29/2021  ? ?We updated and reviewed the patient's past history in detail and it is documented below. ?Allergies: ?Patient is allergic to bee venom and other. ?Past Medical History ?Patient  has a past medical history of Anemia, Asthma, GERD (gastroesophageal reflux disease), Hypertension, and Migraine headache. ?Past Surgical History ?Patient  has a past surgical history that includes Knee surgery. ?Family History: ?Patient family history includes Heart attack in her maternal grandmother; Hypertension in her maternal grandmother; Lung cancer in her maternal aunt. ?Social History:  ?Patient  reports that she quit smoking about 3 years ago. Her smoking use included cigarettes. She started smoking about 11 years ago. She has a 3.50 pack-year smoking history. She has never used smokeless tobacco. She  reports current alcohol use. She reports that she does not currently use drugs after having used the following drugs: Marijuana. ? ?Review of Systems: ?Constitutional: negative for fever or malaise ?Ophthalmic: negative for photophobia, double vision or loss of vision ?Cardiovascular: negative for chest pain, dyspnea on exertion, or new LE swelling ?Respiratory: negative for SOB or persistent cough ?Gastrointestinal: negative for abdominal pain, change in bowel habits or melena ?Genitourinary: negative for dysuria or gross hematuria, no abnormal uterine bleeding or disharge ?Musculoskeletal: negative for new gait disturbance or muscular weakness ?Integumentary: negative for new or persistent rashes, no breast lumps ?Neurological: negative for TIA or stroke symptoms ?Psychiatric: negative for SI or delusions ?Allergic/Immunologic: negative for hives ? ?Patient Care Team  ?  Relationship Specialty Notifications Start End  ?Leamon Arnt, MD PCP - General Family Medicine  05/22/19   ?Alda Berthold, DO Consulting Physician Neurology  06/12/19   ?Leta Baptist, MD Consulting Physician Otolaryngology  06/24/19   ? ? ?Objective  ?Vitals: BP 136/88   Pulse 92   Temp 98.4 ?F (36.9 ?C)   Ht 5\' 2"  (1.575 m)   Wt 145 lb 3.2 oz (65.9 kg)   SpO2 100%  BMI 26.56 kg/m?  ?General:  Well developed, well nourished, no acute distress  ?Psych:  Alert and orientedx3,normal mood and affect ?HEENT:  Normocephalic, atraumatic, non-icteric sclera,  supple neck without adenopathy, mass or thyromegaly ?Cardiovascular:  Normal S1, S2, RRR without gallop, rub or murmur ?Respiratory:  Good breath sounds bilaterally, CTAB with normal respiratory effort ?Gastrointestinal: normal bowel sounds, soft, non-tender, no noted masses. No HSM ?MSK: no deformities, contusions. Joints are without erythema or swelling.  ?Skin:  Warm, no rashes or suspicious lesions noted ?Neurologic:    Mental status is normal. CN 2-11 are normal. Gross motor and sensory  exams are normal. Normal gait. No tremor ? ? ?Commons side effects, risks, benefits, and alternatives for medications and treatment plan prescribed today were discussed, and the patient expressed understanding of the

## 2021-10-19 ENCOUNTER — Encounter: Payer: Self-pay | Admitting: Family

## 2021-10-19 ENCOUNTER — Telehealth (INDEPENDENT_AMBULATORY_CARE_PROVIDER_SITE_OTHER): Payer: 59 | Admitting: Family

## 2021-10-19 ENCOUNTER — Telehealth: Payer: Self-pay | Admitting: Family Medicine

## 2021-10-19 VITALS — Ht 62.0 in | Wt 145.0 lb

## 2021-10-19 DIAGNOSIS — K529 Noninfective gastroenteritis and colitis, unspecified: Secondary | ICD-10-CM | POA: Diagnosis not present

## 2021-10-19 MED ORDER — ONDANSETRON HCL 4 MG PO TABS: 4.0000 mg | ORAL_TABLET | Freq: Three times a day (TID) | ORAL | 0 refills | Status: DC | PRN

## 2021-10-19 NOTE — Telephone Encounter (Signed)
Letter sent.

## 2021-10-19 NOTE — Telephone Encounter (Signed)
Pt is needing a letter for her work and is asking to have it sent through [employer].  ?

## 2021-10-19 NOTE — Telephone Encounter (Signed)
error 

## 2021-10-19 NOTE — Progress Notes (Signed)
? ? ?MyChart Video Visit ? ? ? ?Virtual Visit via Video Note  ? ?This visit type was conducted due to national recommendations for restrictions regarding the COVID-19 Pandemic (e.g. social distancing) in an effort to limit this patient's exposure and mitigate transmission in our community. This patient is at least at moderate risk for complications without adequate follow up. This format is felt to be most appropriate for this patient at this time. Physical exam was limited by quality of the video and audio technology used for the visit. CMA was able to get the patient set up on a video visit. ? ?Patient location: Home. Patient and provider in visit ?Provider location: Office ? ?I discussed the limitations of evaluation and management by telemedicine and the availability of in person appointments. The patient expressed understanding and agreed to proceed. ? ?Visit Date: 10/19/2021 ? ?Today's healthcare provider: Jeanie Sewer, NP  ? ? ? ?Subjective:  ? ? Patient ID: Jessica Hale, female    DOB: September 25, 1975, 46 y.o.   MRN: [id number] ? ?Chief Complaint  ?Patient presents with  ? Nausea  ?  Pt c/o nausea Ongoing since 10/17/2021,   ? Diarrhea  ?  Pt tried imodium and a lot of fluids and c/o some weakness.   ? ? ?HPI: ?Gastroenteritis:  Nausea & vomiting and diarrhea - started Imodium AD yesterday, reports 3  loose stools so far today, but yesterday had 6-8 watery stools. Believes she contracted virus from coworker, denies eating any restaurant food, or spoiled food at home. Denies fever or abdominal pain. ? ?Past Medical History:  ?Diagnosis Date  ? Anemia   ? Asthma   ? GERD (gastroesophageal reflux disease)   ? Hypertension   ? Migraine headache   ? ? ?Past Surgical History:  ?Procedure Laterality Date  ? KNEE SURGERY    ? Age 38  ? ? ?Outpatient Medications Prior to Visit  ?Medication Sig Dispense Refill  ? EPIPEN 2-PAK 0.3 MG/0.3ML SOAJ injection inject as directed WITH BEE STING  0  ? hydrochlorothiazide  (HYDRODIURIL) 25 MG tablet Take 1 tablet (25 mg total) by mouth daily. 90 tablet 0  ? metoprolol succinate (TOPROL-XL) 50 MG 24 hr tablet TAKE 1 TABLET(50 MG) BY MOUTH DAILY WITH OR IMMEDIATELY FOLLOWING A MEAL 90 tablet 1  ? ?No facility-administered medications prior to visit.  ? ? ?Allergies  ?Allergen Reactions  ? Bee Venom Anaphylaxis  ? Other Anaphylaxis  ? ?   ?Objective:  ?  ? ?Physical Exam ?Vitals and nursing note reviewed.  ?Constitutional:   ?   General: She is not in acute distress. ?   Appearance: Normal appearance.  ?HENT:  ?   Head: Normocephalic.  ?Pulmonary:  ?   Effort: No respiratory distress.  ?Musculoskeletal:  ?   Cervical back: Normal range of motion.  ?Skin: ?   General: Skin is dry.  ?   Coloration: Skin is not pale.  ?Neurological:  ?   Mental Status: She is alert and oriented to person, place, and time.  ?Psychiatric:     ?   Mood and Affect: Mood normal.  ? ?Ht 5\' 2"  (1.575 m)   Wt 145 lb (65.8 kg)   LMP 09/26/2021 (Exact Date)   BMI 26.52 kg/m?  ? ?Wt Readings from Last 3 Encounters:  ?10/19/21 145 lb (65.8 kg)  ?09/29/21 145 lb 3.2 oz (65.9 kg)  ?06/07/21 137 lb 2 oz (62.2 kg)  ? ?   ?Assessment & Plan:  ? ?  Problem List Items Addressed This Visit   ?None ?Visit Diagnoses   ? ? Gastroenteritis    -  Primary  ? started 3 days ago, pt started Imodium, OTC, yesterday, stools are starting to firm up, not watery, still having nausea, no vomiting today. Sending Zofran advised on use & SE, importance of hydration, BRAT diet, continue Imodium AD per box directions until down to 2 stools daily and just soft, not loose. ? ?Relevant Medications  ? ondansetron (ZOFRAN) 4 MG tablet  ? ?  ? ?Meds ordered this encounter  ?Medications  ? ondansetron (ZOFRAN) 4 MG tablet  ?  Sig: Take 1 tablet (4 mg total) by mouth every 8 (eight) hours as needed for nausea or vomiting.  ?  Dispense:  20 tablet  ?  Refill:  0  ?  Order Specific Question:   Supervising Provider  ?  Answer:   ANDY, CAMILLE L [[id number]]   ? ? ?I discussed the assessment and treatment plan with the patient. The patient was provided an opportunity to ask questions and all were answered. The patient agreed with the plan and demonstrated an understanding of the instructions. ?  ?The patient was advised to call back or seek an in-person evaluation if the symptoms worsen or if the condition fails to improve as anticipated. ? ?I provided 21 minutes of face-to-face time during this encounter. ? ? ?Jeanie Sewer, NP ?[place] ?[phone] (phone) ?[phone] (fax) ? ? [facility]  ?

## 2022-01-27 ENCOUNTER — Other Ambulatory Visit: Payer: Self-pay | Admitting: Family Medicine

## 2022-03-08 ENCOUNTER — Other Ambulatory Visit: Payer: Self-pay | Admitting: Family Medicine

## 2022-03-08 DIAGNOSIS — I10 Essential (primary) hypertension: Secondary | ICD-10-CM

## 2022-03-22 ENCOUNTER — Encounter: Payer: Self-pay | Admitting: Family Medicine

## 2022-03-22 ENCOUNTER — Telehealth: Payer: 59 | Admitting: Family Medicine

## 2022-03-22 ENCOUNTER — Telehealth (INDEPENDENT_AMBULATORY_CARE_PROVIDER_SITE_OTHER): Payer: 59 | Admitting: Family Medicine

## 2022-03-22 ENCOUNTER — Telehealth: Payer: Self-pay | Admitting: Family Medicine

## 2022-03-22 DIAGNOSIS — F4323 Adjustment disorder with mixed anxiety and depressed mood: Secondary | ICD-10-CM

## 2022-03-22 MED ORDER — MIRTAZAPINE 7.5 MG PO TABS: 7.5000 mg | ORAL_TABLET | Freq: Every day | ORAL | 0 refills | Status: DC

## 2022-03-22 NOTE — Patient Instructions (Signed)
Try mirtazipine for sleep.  If not help, can take 2 tablets.  If not working, let Dr. Mardelle Matte know.

## 2022-03-22 NOTE — Progress Notes (Signed)
MyChart Video Visit    Virtual Visit via Video Note   This visit type was conducted due to national recommendations for restrictions regarding the COVID-19 Pandemic (e.g. social distancing) in an effort to limit this patient's exposure and mitigate transmission in our community. This patient is at least at moderate risk for complications without adequate follow up. This format is felt to be most appropriate for this patient at this time. Physical exam was limited by quality of the video and audio technology used for the visit. CMA was able to get the patient set up on a video visit.  Patient location: Home. Patient and provider in visit Provider location: Office  I discussed the limitations of evaluation and management by telemedicine and the availability of in person appointments. The patient expressed understanding and agreed to proceed.  Visit Date: 03/22/2022  Today's healthcare provider: Angelena Sole, MD     Subjective:    Patient ID: Jessica Hale, female    DOB: [birth year], 46 y.o.   MRN: [id number]  Chief Complaint  Patient presents with   Anxiety    Brother died recently, grieving, would like medication    HPI Anxiety-brother died recently-8/24.  Buried 04-14-23. Feeling down and not sleeping.  A lot of deaths in family and now this.  going thru divorce.  Doesn't want to go back down road to depression.   No SI.  Drives truck so needs to sleep.   Not want the SE of meds nor feeling like a "zombie" Off work 8/31-today.  Wants to return tomorrow   Past Medical History:  Diagnosis Date   Anemia    Asthma    GERD (gastroesophageal reflux disease)    Hypertension    Migraine headache     Past Surgical History:  Procedure Laterality Date   KNEE SURGERY     Age 46    Outpatient Medications Prior to Visit  Medication Sig Dispense Refill   EPIPEN 2-PAK 0.3 MG/0.3ML SOAJ injection inject as directed WITH BEE STING  0   hydrochlorothiazide (HYDRODIURIL) 25 MG tablet  TAKE 1 TABLET(25 MG) BY MOUTH DAILY 90 tablet 0   metoprolol succinate (TOPROL-XL) 50 MG 24 hr tablet TAKE 1 TABLET(50 MG) BY MOUTH DAILY WITH OR IMMEDIATELY FOLLOWING A MEAL 90 tablet 1   ondansetron (ZOFRAN) 4 MG tablet Take 1 tablet (4 mg total) by mouth every 8 (eight) hours as needed for nausea or vomiting. 20 tablet 0   No facility-administered medications prior to visit.    Allergies  Allergen Reactions   Bee Venom Anaphylaxis   Other Anaphylaxis        Objective:     Physical Exam  Vitals and nursing note reviewed.  Constitutional:      General:  is not in acute distress.    Appearance: Normal appearance.  HENT:     Head: Normocephalic.  Pulmonary:     Effort: No respiratory distress.  Skin:    General: Skin is dry.     Coloration: Skin is not pale.  Neurological:     Mental Status: Pt is alert and oriented to person, place, and time.  Psychiatric:        Mood and Affect: Mood normal. tearful  There were no vitals taken for this visit.  Wt Readings from Last 3 Encounters:  10/19/21 145 lb (65.8 kg)  09/29/21 145 lb 3.2 oz (65.9 kg)  06/07/21 137 lb 2 oz (62.2 kg)  Assessment & Plan:   Problem List Items Addressed This Visit   None Visit Diagnoses     Adjustment disorder with mixed anxiety and depressed mood    -  Primary      Adjustment disorder.  Exacerbated by recent death of brother.  Not wanting daily meds.  In counseling.  H/o panic attacks.  More wanting help w/sleep.  Will trial remeron 7.5mg  at hs-can do 2 if needed.  If not working, message Dr. Mardelle Matte.  If needing long term meds(changes mind), let Dr. Mardelle Matte know.     Meds ordered this encounter  Medications   mirtazapine (REMERON) 7.5 MG tablet    Sig: Take 1 tablet (7.5 mg total) by mouth at bedtime.    Dispense:  30 tablet    Refill:  0    I discussed the assessment and treatment plan with the patient. The patient was provided an opportunity to ask questions and all were answered.  The patient agreed with the plan and demonstrated an understanding of the instructions.   The patient was advised to call back or seek an in-person evaluation if the symptoms worsen or if the condition fails to improve as anticipated.  I provided 15 minutes of face-to-face time during this encounter.   Angelena Sole, MD [facility] [phone] (phone) [phone] (fax)  [facility]

## 2022-03-22 NOTE — Telephone Encounter (Signed)
Patient Name: Jessica Hale ALL Gender: Female DOB: Sep 24, 1975 Age: 46 Y 10 M 19 D Return Phone Number: [phone] (Primary) Address: City/ State/ Zip: [place]  [address] Client [facility] Night - Human resources officer [facility] Night Provider Asencion Partridge- MD Contact Type Call Who Is Calling Patient / Member / Family / Caregiver Call Type Triage / Clinical Relationship To Patient Self Return Phone Number [phone] (Primary) Chief Complaint Anxiety and Panic Attack Reason for Call Symptomatic / Request for Health Information Initial Comment Caller states, need to be seen. Has anxiety. Needs meds. Having issues sleeping. Translation No Nurse Assessment Nurse: [place], RN, Marchelle Folks Date/Time (Eastern Time): 03/22/2022 8:22:36 AM Confirm and document reason for call. If symptomatic, describe symptoms. ---caller states her brother just passed away 03/30/2023. she is having a lot of anxiety. does not take anything right now. Does the patient have any new or worsening symptoms? ---Yes Will a triage be completed? ---Yes Related visit to physician within the last 2 weeks? ---N/A Does the PT have any chronic conditions? (i.e. diabetes, asthma, this includes High risk factors for pregnancy, etc.) ---Yes List chronic conditions. ---HTN Is the patient pregnant or possibly pregnant? (Ask all females between the ages of 29-55) ---No Is this a behavioral health or substance abuse call? ---Yes Are you having any thoughts or feelings of harming or killing yourself or someone else? ---No Are you currently experiencing any physical discomfort that you think may be related to the use of alcohol or other drugs? (use substance abuse or alcohol abuse guidelines. These include withdrawal symptoms) ---No Do you worry that you may be hearing or seeing things that others do not? ---No  Nurse Assessment Do you take medications for  your condition(s)? ---No Guidelines Guideline Title Affirmed Question Affirmed Notes Nurse Date/Time (Eastern Time) Anxiety and Panic Attack Patient sounds very upset or troubled to the triager Hum[place]et, RN, Marchelle Folks 03/22/2022 8:24:00 AM Disp. Time Lamount Cohen Time) Disposition Final User 03/22/2022 8:29:14 AM See PCP within 24 Hours Yes Hum[place]et, RN, Marchelle Folks Final Disposition 03/22/2022 8:29:14 AM See PCP within 24 Hours Yes [place], RN, Earnestine Leys Disagree/Comply Comply Caller Understands Yes PreDisposition Did not know what to do Care Advice Given Per Guideline SEE PCP WITHIN 24 HOURS: * IF OFFICE WILL BE OPEN: You need to be examined within the next 24 hours. Call your doctor (or NP/PA) when the office opens and make an appointment. CARE ADVICE given per Anxiety and Panic Attack (Adult) guideline. * You become worse CALL BACK IF: Referrals REFERRED TO PCP OFFICE

## 2022-06-01 ENCOUNTER — Encounter: Payer: Self-pay | Admitting: Family Medicine

## 2022-06-01 ENCOUNTER — Other Ambulatory Visit (HOSPITAL_COMMUNITY)
Admission: RE | Admit: 2022-06-01 | Discharge: 2022-06-01 | Disposition: A | Discharge status: Home / Self Care | Payer: 59 | Source: Ambulatory Visit | Attending: Family Medicine | Admitting: Family Medicine

## 2022-06-01 ENCOUNTER — Ambulatory Visit: Payer: 59 | Admitting: Family Medicine

## 2022-06-01 VITALS — BP 138/84 | HR 73 | Temp 98.2°F | Ht 62.0 in | Wt 156.4 lb

## 2022-06-01 DIAGNOSIS — N898 Other specified noninflammatory disorders of vagina: Secondary | ICD-10-CM | POA: Insufficient documentation

## 2022-06-01 DIAGNOSIS — Z113 Encounter for screening for infections with a predominantly sexual mode of transmission: Secondary | ICD-10-CM

## 2022-06-01 NOTE — Progress Notes (Signed)
Subjective  CC:  Chief Complaint  Patient presents with   knot in her vagina    Pt stated that for the past 2 1/4mos she had noticed that there was a knot in her vagina. No pain or nothing. Would also like a referral to OB. Also would like STD testing     HPI: Jessica Hale is a 46 y.o. female who presents to the office today to address the problems listed above in the chief complaint. Felt something on the posterior vaginal [place]: nontender, intermittent. Didn't feel today. No soreness or pain.  Denies symptomatic discharge. Regular menses. Pap up to date. New partner, would like STD screening done. Same sex. No sxs.    Assessment  1. Screen for STD (sexually transmitted disease)   2. Vaginal mass   3. Vaginal discharge      Plan  Screen for STDs:  await results Nl vaginal exam today. Reassured: ? Redundant vaginal tissue or rugae or if feeling bony pelvis. Vaginal d/c on exam: check for yeast. Treat if positive due to large amount.   Follow up: march 2024 for cpe  Visit date not found  Orders Placed This Encounter  Procedures   RPR   HIV Antibody (routine testing w rflx)   No orders of the defined types were placed in this encounter.     I reviewed the patients updated PMH, FH, and SocHx.    Patient Active Problem List   Diagnosis Date Noted   Cervical radiculopathy 04/20/2018    Priority: High   Meniere's disease (cochlear hydrops) 01/25/2013    Priority: High   Atypical migraine 05/02/2011    Priority: High   Essential hypertension 02/07/2007    Priority: High   Carpal tunnel syndrome on right 08/01/2014    Priority: Medium    GERD 10/16/2007    Priority: Medium    Mild intermittent asthma 02/07/2007    Priority: Medium    Chronic periscapular pain on right side 01/01/2018    Priority: Low   Chronic right-sided low back pain without sciatica 08/01/2014    Priority: Low   Allergic rhinitis 04/10/2012    Priority: Low   Adjustment disorder with  anxiety 07/07/2021   Current Meds  Medication Sig   EPIPEN 2-PAK 0.3 MG/0.3ML SOAJ injection inject as directed WITH BEE STING   hydrochlorothiazide (HYDRODIURIL) 25 MG tablet TAKE 1 TABLET(25 MG) BY MOUTH DAILY   metoprolol succinate (TOPROL-XL) 50 MG 24 hr tablet TAKE 1 TABLET(50 MG) BY MOUTH DAILY WITH OR IMMEDIATELY FOLLOWING A MEAL    Allergies: Patient is allergic to bee venom and other. Family History: Patient family history includes Heart attack in her maternal grandmother; Hypertension in her maternal grandmother; Lung cancer in her maternal aunt. Social History:  Patient  reports that she quit smoking about 3 years ago. Her smoking use included cigarettes. She started smoking about 11 years ago. She has a 3.50 pack-year smoking history. She has never used smokeless tobacco. She reports current alcohol use. She reports that she does not currently use drugs after having used the following drugs: Marijuana.  Review of Systems: Constitutional: Negative for fever malaise or anorexia Cardiovascular: negative for chest pain Respiratory: negative for SOB or persistent cough Gastrointestinal: negative for abdominal pain  Objective  Vitals: BP 138/84   Pulse 73   Temp 98.2 F (36.8 C)   Ht 5\' 2"  (1.575 m)   Wt 156 lb 6.4 oz (70.9 kg)   SpO2 96%  BMI 28.61 kg/m  General: no acute distress , A&Ox3 GYN: no introitus. No vesicles or lesions; normal vaginal walls/cervix with white cottage cheese like d/c. No palpable masses on bimanual exam. Nl nontender cervix.     Commons side effects, risks, benefits, and alternatives for medications and treatment plan prescribed today were discussed, and the patient expressed understanding of the given instructions. Patient is instructed to call or message via MyChart if he/she has any questions or concerns regarding our treatment plan. No barriers to understanding were identified. We discussed Red Flag symptoms and signs in detail. Patient  expressed understanding regarding what to do in case of urgent or emergency type symptoms.  Medication list was reconciled, printed and provided to the patient in AVS. Patient instructions and summary information was reviewed with the patient as documented in the AVS. This note was prepared with assistance of Dragon voice recognition software. Occasional wrong-word or sound-a-like substitutions may have occurred due to the inherent limitations of voice recognition software  This visit occurred during the SARS-CoV-2 public health emergency.  Safety protocols were in place, including screening questions prior to the visit, additional usage of staff PPE, and extensive cleaning of exam room while observing appropriate contact time as indicated for disinfecting solutions.

## 2022-06-01 NOTE — Patient Instructions (Signed)
Please return in March 2024 for your annual complete physical; please come fasting.   I will release your lab results to you on your MyChart account with further instructions. You may see the results before I do, but when I review them I will send you a message with my report or have my assistant call you if things need to be discussed. Please reply to my message with any questions. Thank you!   If you have any questions or concerns, please don't hesitate to send me a message via MyChart or call the office at [phone]. Thank you for visiting with [place] today! It's our pleasure caring for you.

## 2022-06-02 LAB — RPR: RPR Ser Ql: NONREACTIVE

## 2022-06-02 LAB — HIV ANTIBODY (ROUTINE TESTING W REFLEX): HIV 1&2 Ab, 4th Generation: NONREACTIVE

## 2022-06-03 LAB — CERVICOVAGINAL ANCILLARY ONLY
Bacterial Vaginitis (gardnerella): POSITIVE — AB
Candida Glabrata: NEGATIVE
Candida Vaginitis: NEGATIVE
Chlamydia: NEGATIVE
Comment: NEGATIVE
Comment: NEGATIVE
Comment: NEGATIVE
Comment: NEGATIVE
Comment: NEGATIVE
Comment: NORMAL
Neisseria Gonorrhea: NEGATIVE
Trichomonas: NEGATIVE

## 2022-06-03 MED ORDER — METRONIDAZOLE 500 MG PO TABS: 500.0000 mg | ORAL_TABLET | Freq: Two times a day (BID) | ORAL | 0 refills | Status: AC

## 2022-06-03 NOTE — Addendum Note (Signed)
Addended by: Asencion Partridge on: 06/03/2022 02:09 PM   Modules accepted: Orders

## 2022-06-29 ENCOUNTER — Other Ambulatory Visit: Payer: Self-pay | Admitting: Family Medicine

## 2022-06-29 DIAGNOSIS — I10 Essential (primary) hypertension: Secondary | ICD-10-CM

## 2022-09-09 ENCOUNTER — Other Ambulatory Visit: Payer: Self-pay | Admitting: Family Medicine

## 2022-10-10 ENCOUNTER — Encounter: Payer: Self-pay | Admitting: Physician Assistant

## 2022-10-10 ENCOUNTER — Ambulatory Visit: Payer: 59 | Admitting: Physician Assistant

## 2022-10-10 VITALS — BP 120/80 | HR 63 | Temp 97.3°F | Ht 62.0 in | Wt 160.0 lb

## 2022-10-10 DIAGNOSIS — K649 Unspecified hemorrhoids: Secondary | ICD-10-CM

## 2022-10-10 MED ORDER — HYDROCORTISONE ACETATE 25 MG RE SUPP: 25.0000 mg | Freq: Two times a day (BID) | RECTAL | 0 refills | Status: DC

## 2022-10-10 NOTE — Patient Instructions (Signed)
It was great to see you!  Push fluids and fiber  Follow-up if any new/worsening symptoms  Take care,  Inda Coke PA-C

## 2022-10-10 NOTE — Progress Notes (Signed)
Jessica Hale is a 47 y.o. female here for a new problem.  History of Present Illness:   Chief Complaint  Patient presents with   Hemorrhoids    Pt c/o hemorrhoids painful, but not bleeding.    HPI  Hemorrhoids: She complains of hemorrhoids with associated pain and diarrhea. Her current pain is not as severe as prior episodes.  She uses Imodium and Vaseline to help her symptoms and they resolve after a while. She has not been prescribed any medications since episodes tend to be about every 3 months.  She denies any bleeding.  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   Past Medical History:  Diagnosis Date   Anemia    Asthma    GERD (gastroesophageal reflux disease)    Hypertension    Migraine headache      Social History   Tobacco Use   Smoking status: Former    Packs/day: 0.50    Years: 7.00    Additional pack years: 0.00    Total pack years: 3.50    Types: Cigarettes    Start date: 2012    Quit date: 2020    Years since quitting: 4.2   Smokeless tobacco: Never  Vaping Use   Vaping Use: Never used  Substance Use Topics   Alcohol use: Yes    Alcohol/week: 0.0 standard drinks of alcohol    Comment: occasional   Drug use: Not Currently    Types: Marijuana    Past Surgical History:  Procedure Laterality Date   KNEE SURGERY     Age 76    Family History  Problem Relation Age of Onset   Heart attack Maternal Grandmother    Hypertension Maternal Grandmother    Lung cancer Maternal Aunt    Colon cancer Neg Hx     Allergies  Allergen Reactions   Bee Venom Anaphylaxis   Other Anaphylaxis    Current Medications:   Current Outpatient Medications:    EPIPEN 2-PAK 0.3 MG/0.3ML SOAJ injection, inject as directed WITH BEE STING, Disp: , Rfl: 0   hydrochlorothiazide (HYDRODIURIL) 25 MG tablet, TAKE 1 TABLET(25 MG) BY MOUTH DAILY, Disp: 90 tablet, Rfl: 3   hydrocortisone (ANUSOL-HC) 25 MG suppository, Place 1  suppository (25 mg total) rectally 2 (two) times daily., Disp: 12 suppository, Rfl: 0   metoprolol succinate (TOPROL-XL) 50 MG 24 hr tablet, TAKE 1 TABLET(50 MG) BY MOUTH DAILY WITH OR IMMEDIATELY FOLLOWING A MEAL, Disp: 90 tablet, Rfl: 1   Review of Systems:   Review of Systems  Gastrointestinal:  Positive for diarrhea (hemorrhoids).    Vitals:   Vitals:   10/10/22 0840  BP: 120/80  Pulse: 63  Temp: (!) 97.3 F (36.3 C)  TempSrc: Temporal  SpO2: 100%  Weight: 160 lb (72.6 kg)  Height: 5\' 2"  (1.575 m)     Body mass index is 29.26 kg/m.  Physical Exam:   Physical Exam Exam conducted with a chaperone present Junious Dresser, RMA).  Constitutional:      Appearance: Normal appearance. She is well-developed.  HENT:     Head: Normocephalic and atraumatic.  Eyes:     General: Lids are normal.     Extraocular Movements: Extraocular movements intact.     Conjunctiva/sclera: Conjunctivae normal.  Pulmonary:     Effort: Pulmonary effort is normal.  Genitourinary:    Rectum: External hemorrhoid present.  Musculoskeletal:        General: Normal  range of motion.     Cervical back: Normal range of motion and neck supple.  Skin:    General: Skin is warm and dry.  Neurological:     Mental Status: She is alert and oriented to person, place, and time.  Psychiatric:        Attention and Perception: Attention and perception normal.        Mood and Affect: Mood normal.        Behavior: Behavior normal.        Thought Content: Thought content normal.        Judgment: Judgment normal.     Assessment and Plan:   Hemorrhoids, unspecified hemorrhoid type No red flags Will trial anusol suppository Push fluids and avoid prolonged sitting Work note provided Follow-up if new/worsening sx  I,Rachel Rivera,acting as a Education administrator for [employer], PA.,have documented all relevant documentation on the behalf of Inda Coke, PA,as directed by  Inda Coke, PA while in the presence of  Inda Coke, [place].   I, Inda Coke, [place], have reviewed all documentation for this visit. The documentation on 10/10/22 for the exam, diagnosis, procedures, and orders are all accurate and complete.    Inda Coke, PA-C

## 2023-03-01 ENCOUNTER — Other Ambulatory Visit (HOSPITAL_COMMUNITY)
Admission: RE | Admit: 2023-03-01 | Discharge: 2023-03-01 | Disposition: A | Discharge status: Home / Self Care | Payer: 59 | Source: Ambulatory Visit | Attending: Family Medicine | Admitting: Family Medicine

## 2023-03-01 ENCOUNTER — Ambulatory Visit: Payer: 59 | Admitting: Family Medicine

## 2023-03-01 ENCOUNTER — Encounter: Payer: Self-pay | Admitting: Family Medicine

## 2023-03-01 VITALS — BP 164/100 | HR 67 | Temp 98.0°F | Ht 62.0 in | Wt 169.8 lb

## 2023-03-01 DIAGNOSIS — B3731 Acute candidiasis of vulva and vagina: Secondary | ICD-10-CM | POA: Diagnosis not present

## 2023-03-01 DIAGNOSIS — N898 Other specified noninflammatory disorders of vagina: Secondary | ICD-10-CM | POA: Diagnosis not present

## 2023-03-01 DIAGNOSIS — Z124 Encounter for screening for malignant neoplasm of cervix: Secondary | ICD-10-CM | POA: Insufficient documentation

## 2023-03-01 DIAGNOSIS — I10 Essential (primary) hypertension: Secondary | ICD-10-CM

## 2023-03-01 MED ORDER — FLUCONAZOLE 150 MG PO TABS: ORAL_TABLET | ORAL | 0 refills | Status: DC

## 2023-03-01 NOTE — Patient Instructions (Signed)
Please return in 3 months for your annual complete physical; please come fasting.    Please check your blood pressure at work multiple times this week and send me the numbers. We may need to adjust your blood pressure medications.   If you have any questions or concerns, please don't hesitate to send me a message via MyChart or call the office at [phone]. Thank you for visiting with [place] today! It's our pleasure caring for you.   Hypertension, Adult High blood pressure (hypertension) is when the force of blood pumping through the arteries is too strong. The arteries are the blood vessels that carry blood from the heart throughout the body. Hypertension forces the heart to work harder to pump blood and may cause arteries to become narrow or stiff. Untreated or uncontrolled hypertension can lead to a heart attack, heart failure, a stroke, kidney disease, and other problems. A blood pressure reading consists of a higher number over a lower number. Ideally, your blood pressure should be below 120/80. The first ("top") number is called the systolic pressure. It is a measure of the pressure in your arteries as your heart beats. The second ("bottom") number is called the diastolic pressure. It is a measure of the pressure in your arteries as the heart relaxes. What are the causes? The exact cause of this condition is not known. There are some conditions that result in high blood pressure. What increases the risk? Certain factors may make you more likely to develop high blood pressure. Some of these risk factors are under your control, including: Smoking. Not getting enough exercise or physical activity. Being overweight. Having too much fat, sugar, calories, or salt (sodium) in your diet. Drinking too much alcohol. Other risk factors include: Having a personal history of heart disease, diabetes, high cholesterol, or kidney disease. Stress. Having a family history of high blood pressure and high  cholesterol. Having obstructive sleep apnea. Age. The risk increases with age. What are the signs or symptoms? High blood pressure may not cause symptoms. Very high blood pressure (hypertensive crisis) may cause: Headache. Fast or irregular heartbeats (palpitations). Shortness of breath. Nosebleed. Nausea and vomiting. Vision changes. Severe chest pain, dizziness, and seizures. How is this diagnosed? This condition is diagnosed by measuring your blood pressure while you are seated, with your arm resting on a flat surface, your legs uncrossed, and your feet flat on the floor. The cuff of the blood pressure monitor will be placed directly against the skin of your upper arm at the level of your heart. Blood pressure should be measured at least twice using the same arm. Certain conditions can cause a difference in blood pressure between your right and left arms. If you have a high blood pressure reading during one visit or you have normal blood pressure with other risk factors, you may be asked to: Return on a different day to have your blood pressure checked again. Monitor your blood pressure at home for 1 week or longer. If you are diagnosed with hypertension, you may have other blood or imaging tests to help your health care provider understand your overall risk for other conditions. How is this treated? This condition is treated by making healthy lifestyle changes, such as eating healthy foods, exercising more, and reducing your alcohol intake. You may be referred for counseling on a healthy diet and physical activity. Your health care provider may prescribe medicine if lifestyle changes are not enough to get your blood pressure under control and if: Your  systolic blood pressure is above 130. Your diastolic blood pressure is above 80. Your personal target blood pressure may vary depending on your medical conditions, your age, and other factors. Follow these instructions at home: Eating and  drinking  Eat a diet that is high in fiber and potassium, and low in sodium, added sugar, and fat. An example of this eating plan is called the DASH diet. DASH stands for Dietary Approaches to Stop Hypertension. To eat this way: Eat plenty of fresh fruits and vegetables. Try to fill one half of your plate at each meal with fruits and vegetables. Eat whole grains, such as whole-wheat pasta, brown rice, or whole-grain bread. Fill about one fourth of your plate with whole grains. Eat or drink low-fat dairy products, such as skim milk or low-fat yogurt. Avoid fatty cuts of meat, processed or cured meats, and poultry with skin. Fill about one fourth of your plate with lean proteins, such as fish, chicken without skin, beans, eggs, or tofu. Avoid pre-made and processed foods. These tend to be higher in sodium, added sugar, and fat. Reduce your daily sodium intake. Many people with hypertension should eat less than 1,500 mg of sodium a day. Do not drink alcohol if: Your health care provider tells you not to drink. You are pregnant, may be pregnant, or are planning to become pregnant. If you drink alcohol: Limit how much you have to: 0-1 drink a day for women. 0-2 drinks a day for men. Know how much alcohol is in your drink. In the [place] one drink equals one 12 oz bottle of beer (355 mL), one 5 oz glass of wine (148 mL), or one 1 oz glass of hard liquor (44 mL). Lifestyle  Work with your health care provider to maintain a healthy body weight or to lose weight. Ask what an ideal weight is for you. Get at least 30 minutes of exercise that causes your heart to beat faster (aerobic exercise) most days of the week. Activities may include walking, swimming, or biking. Include exercise to strengthen your muscles (resistance exercise), such as Pilates or lifting weights, as part of your weekly exercise routine. Try to do these types of exercises for 30 minutes at least 3 days a week. Do not use any products  that contain nicotine or tobacco. These products include cigarettes, chewing tobacco, and vaping devices, such as e-cigarettes. If you need help quitting, ask your health care provider. Monitor your blood pressure at home as told by your health care provider. Keep all follow-up visits. This is important. Medicines Take over-the-counter and prescription medicines only as told by your health care provider. Follow directions carefully. Blood pressure medicines must be taken as prescribed. Do not skip doses of blood pressure medicine. Doing this puts you at risk for problems and can make the medicine less effective. Ask your health care provider about side effects or reactions to medicines that you should watch for. Contact a health care provider if you: Think you are having a reaction to a medicine you are taking. Have headaches that keep coming back (recurring). Feel dizzy. Have swelling in your ankles. Have trouble with your vision. Get help right away if you: Develop a severe headache or confusion. Have unusual weakness or numbness. Feel faint. Have severe pain in your chest or abdomen. Vomit repeatedly. Have trouble breathing. These symptoms may be an emergency. Get help right away. Call 911. Do not wait to see if the symptoms will go away. Do not drive yourself  to the hospital. Summary Hypertension is when the force of blood pumping through your arteries is too strong. If this condition is not controlled, it may put you at risk for serious complications. Your personal target blood pressure may vary depending on your medical conditions, your age, and other factors. For most people, a normal blood pressure is less than 120/80. Hypertension is treated with lifestyle changes, medicines, or a combination of both. Lifestyle changes include losing weight, eating a healthy, low-sodium diet, exercising more, and limiting alcohol. This information is not intended to replace advice given to you by  your health care provider. Make sure you discuss any questions you have with your health care provider. Document Revised: 05/11/2021 Document Reviewed: 05/11/2021 Elsevier Patient Education  2024 [employer].

## 2023-03-01 NOTE — Progress Notes (Signed)
Subjective  CC:  Chief Complaint  Patient presents with   bump in vagina    Pt stated that she noticed a knot down in her vagina for the past 2 months. She noticed it a few days before she came on her cycle and then when she actually came on it went away. There was no pain with it. Also want a referral to Obgyn`    HPI: Jessica Hale is a 47 y.o. female who presents to the office today to address the problems listed above in the chief complaint. 47 year old female presents due to feeling a bump in the vaginal [place] twice over the last several months.  She does not notice it now.  Came and went after her..  Nontender.  Regular cycles.  She does self exams every month just to be sure nothing is wrong.  She denies vaginal discharge or itching.  No pelvic pain.  No irregular bleeding.  No fevers or chills.  She would like to be examined by an OB/GYN as well.  She is due for her Pap smear. Hypertension: Blood pressure very elevated in the office today.  She feels well.  She takes metoprolol XL 50 daily and HCTZ 25 daily.  She reports she is compliant with medications.  She has not checked her blood pressure in a few months, last checked in the office in March and was well-controlled.  No recent changes in diet.  She is calm and not stressed. Assessment  1. Vaginal mass   2. Yeast vaginitis   3. Essential hypertension   4. Cervical cancer screening      Plan  Subjective vaginal mass: Today's exam does not reveal any mass, she does have a prominent pubic symphysis that she may be feeling through the vaginal [place].  Reassurance given.  This is the second time she feels that she has felt a vaginal mass.  We discussed self-examination but patient feels that this is important for her.  I recommend she can be evaluated by an OB/GYN.  She will make an appointment. Treat empirically for visible yeast infection.  Asymptomatic Pap smear sent today with high risk HPV screening.  Patient declines need for  STD screening Hypertension: Uncontrolled in the office today.  Unclear reasons.  Patient to check this week at work multiple times and send me numbers.  He had amlodipine 2.5 mg daily if remains elevated. Overdue for health maintenance  Follow up: 3 months for complete physical Visit date not found  No orders of the defined types were placed in this encounter.  Meds ordered this encounter  Medications   fluconazole (DIFLUCAN) 150 MG tablet    Sig: Take one tablet today; may repeat in 3 days if symptoms persist    Dispense:  2 tablet    Refill:  0      I reviewed the patients updated PMH, FH, and SocHx.    Patient Active Problem List   Diagnosis Date Noted   Cervical radiculopathy 04/20/2018    Priority: High   Meniere's disease (cochlear hydrops) 01/25/2013    Priority: High   Atypical migraine 05/02/2011    Priority: High   Essential hypertension 02/07/2007    Priority: High   Adjustment disorder with anxiety 07/07/2021    Priority: Medium    Carpal tunnel syndrome on right 08/01/2014    Priority: Medium    GERD 10/16/2007    Priority: Medium    Mild intermittent asthma 02/07/2007    Priority:  Medium    Chronic periscapular pain on right side 01/01/2018    Priority: Low   Chronic right-sided low back pain without sciatica 08/01/2014    Priority: Low   Allergic rhinitis 04/10/2012    Priority: Low   Current Meds  Medication Sig   EPIPEN 2-PAK 0.3 MG/0.3ML SOAJ injection inject as directed WITH BEE STING   fluconazole (DIFLUCAN) 150 MG tablet Take one tablet today; may repeat in 3 days if symptoms persist   hydrochlorothiazide (HYDRODIURIL) 25 MG tablet TAKE 1 TABLET(25 MG) BY MOUTH DAILY   metoprolol succinate (TOPROL-XL) 50 MG 24 hr tablet TAKE 1 TABLET(50 MG) BY MOUTH DAILY WITH OR IMMEDIATELY FOLLOWING A MEAL    Allergies: Patient is allergic to bee venom and other. Family History: Patient family history includes Heart attack in her maternal grandmother;  Hypertension in her maternal grandmother; Lung cancer in her maternal aunt. Social History:  Patient  reports that she quit smoking about 4 years ago. Her smoking use included cigarettes. She started smoking about 12 years ago. She has a 4 pack-year smoking history. She has never used smokeless tobacco. She reports current alcohol use. She reports that she does not currently use drugs after having used the following drugs: Marijuana.  Review of Systems: Constitutional: Negative for fever malaise or anorexia Cardiovascular: negative for chest pain Respiratory: negative for SOB or persistent cough Gastrointestinal: negative for abdominal pain  Objective  Vitals: BP (!) 164/100   Pulse 67   Temp 98 F (36.7 C)   Ht 5\' 2"  (1.575 m)   Wt 169 lb 12.8 oz (77 kg)   SpO2 98%   BMI 31.06 kg/m  General: no acute distress , A&Ox3 Pelvic Exam: Normal external genitalia, no vulvar or vaginal lesions present. White clumpy discharge present.Clear cervix w/o CMT. Bimanual exam reveals a nontender fundus w/o masses, nl size. No adnexal masses present. No inguinal adenopathy. A PAP smear was performed.    Commons side effects, risks, benefits, and alternatives for medications and treatment plan prescribed today were discussed, and the patient expressed understanding of the given instructions. Patient is instructed to call or message via MyChart if he/she has any questions or concerns regarding our treatment plan. No barriers to understanding were identified. We discussed Red Flag symptoms and signs in detail. Patient expressed understanding regarding what to do in case of urgent or emergency type symptoms.  Medication list was reconciled, printed and provided to the patient in AVS. Patient instructions and summary information was reviewed with the patient as documented in the AVS. This note was prepared with assistance of Dragon voice recognition software. Occasional wrong-word or sound-a-like substitutions  may have occurred due to the inherent limitations of voice recognition software

## 2023-03-06 LAB — CYTOLOGY - PAP
Comment: NEGATIVE
Diagnosis: UNDETERMINED — AB
High risk HPV: NEGATIVE

## 2023-03-10 ENCOUNTER — Encounter: Payer: Self-pay | Admitting: Family Medicine

## 2023-03-29 ENCOUNTER — Other Ambulatory Visit: Payer: Self-pay | Admitting: Family Medicine

## 2023-06-07 ENCOUNTER — Telehealth: Payer: Self-pay | Admitting: Family Medicine

## 2023-06-07 NOTE — Telephone Encounter (Signed)
Pt call and stated she want to transfer from [facility] Partridge to dr.Banks

## 2023-06-09 ENCOUNTER — Ambulatory Visit (HOSPITAL_COMMUNITY)
Admission: EM | Admit: 2023-06-09 | Discharge: 2023-06-09 | Disposition: A | Discharge status: Home / Self Care | Payer: 59 | Attending: Internal Medicine | Admitting: Internal Medicine

## 2023-06-09 ENCOUNTER — Encounter (HOSPITAL_COMMUNITY): Payer: Self-pay

## 2023-06-09 DIAGNOSIS — N342 Other urethritis: Secondary | ICD-10-CM | POA: Diagnosis not present

## 2023-06-09 DIAGNOSIS — R102 Pelvic and perineal pain: Secondary | ICD-10-CM | POA: Diagnosis not present

## 2023-06-09 NOTE — Discharge Instructions (Signed)
Follow-up with your primary care doctor regarding your pelvic pain as this may require lab work and imaging such as an ultrasound. Follow-up with a gynecologist regarding the lump you feel in the genital area.  Suspect this is a large amount of a Skene's gland

## 2023-06-09 NOTE — ED Provider Notes (Signed)
[facility]    CSN: [id number] Arrival date & time: 06/09/23  1455      History   Chief Complaint Chief Complaint  Patient presents with   Abdominal Pain    HPI Jessica Hale is a 47 y.o. female.    Abdominal Pain Right sided pelvic pain gradual onset several months ago initially was intermittent described as crampy like.  Pain.  Now becoming more frequent, multiple episodes throughout the day.  At worst 5 out of 10 currently 2 out of 10. Denies fever, chills, sweats, nausea, vomiting, diarrhea, constipation, bloody stool, dysuria, frequency, urgency, hematuria, vaginal discharge.  Denies change in weight, change in appetite.  Has had ovarian cysts in the past. Also complaining about a lump she feels vaginally, also present for several months initially was smaller and now larger.  Denies tenderness, drainage. Saw PCP several months ago for similar pain, had pelvic exam, and Pap smear.  Pap revealed ASC-US, provider recommended repeat testing in 1 year  Past Medical History:  Diagnosis Date   Anemia    Asthma    GERD (gastroesophageal reflux disease)    Hypertension    Migraine headache     Patient Active Problem List   Diagnosis Date Noted   Adjustment disorder with anxiety 07/07/2021   Cervical radiculopathy 04/20/2018   Chronic periscapular pain on right side 01/01/2018   Chronic right-sided low back pain without sciatica 08/01/2014   Carpal tunnel syndrome on right 08/01/2014   Meniere's disease (cochlear hydrops) 01/25/2013   Allergic rhinitis 04/10/2012   Atypical migraine 05/02/2011   GERD 10/16/2007   Essential hypertension 02/07/2007   Mild intermittent asthma 02/07/2007    Past Surgical History:  Procedure Laterality Date   KNEE SURGERY     Age 63    OB History   No obstetric history on file.      Home Medications    Prior to Admission medications   Medication Sig Start Date End Date Taking? Authorizing Provider   hydrochlorothiazide (HYDRODIURIL) 25 MG tablet TAKE 1 TABLET(25 MG) BY MOUTH DAILY 06/29/22  Yes Willow Ora, MD  metoprolol tartrate (LOPRESSOR) 25 MG tablet Take by mouth. 04/18/11  Yes [provider]  EPIPEN 2-PAK 0.3 MG/0.3ML SOAJ injection inject as directed WITH BEE STING 12/25/14   [provider]  fluconazole (DIFLUCAN) 150 MG tablet Take one tablet today; may repeat in 3 days if symptoms persist 03/01/23   Willow Ora, MD  albuterol (VENTOLIN HFA) 108 (90 Base) MCG/ACT inhaler Inhale 2 puffs into the lungs every 6 (six) hours as needed for wheezing or shortness of breath. 04/11/19 11/30/20  Helane Rima, DO    Family History Family History  Problem Relation Age of Onset   Heart attack Maternal Grandmother    Hypertension Maternal Grandmother    Lung cancer Maternal Aunt    Colon cancer Neg Hx     Social History Social History   Tobacco Use   Smoking status: Former    Current packs/day: 0.00    Average packs/day: 0.5 packs/day for 8.0 years (4.0 ttl pk-yrs)    Types: Cigarettes    Start date: 2012    Quit date: 2020    Years since quitting: 4.8   Smokeless tobacco: Never  Vaping Use   Vaping status: Never Used  Substance Use Topics   Alcohol use: Yes    Alcohol/week: 0.0 standard drinks of alcohol    Comment: occasional   Drug use: Not Currently  Types: Marijuana     Allergies   Bee venom and Other   Review of Systems Review of Systems  Gastrointestinal:  Positive for abdominal pain.     Physical Exam Triage Vital Signs ED Triage Vitals  Encounter Vitals Group     BP 06/09/23 1647 (!) 161/99     Systolic BP Percentile --      Diastolic BP Percentile --      Pulse Rate 06/09/23 1647 62     Resp 06/09/23 1647 16     Temp 06/09/23 1647 98.5 F (36.9 C)     Temp Source 06/09/23 1647 Oral     SpO2 06/09/23 1647 98 %     Weight 06/09/23 1646 160 lb (72.6 kg)     Height 06/09/23 1646 5' 2.5" (1.588 m)     Head Circumference  --      Peak Flow --      Pain Score 06/09/23 1645 5     Pain Loc --      Pain Education --      Exclude from Growth Chart --    No data found.  Updated Vital Signs BP (!) 161/99 (BP Location: Left Arm) Comment: checked twice.  Pulse 62   Temp 98.5 F (36.9 C) (Oral)   Resp 16   Ht 5' 2.5" (1.588 m)   Wt 160 lb (72.6 kg)   LMP 05/30/2023 (Exact Date)   SpO2 98%   BMI 28.80 kg/m   Visual Acuity Right Eye Distance:   Left Eye Distance:   Bilateral Distance:    Right Eye Near:   Left Eye Near:    Bilateral Near:     Physical Exam Vitals and nursing note reviewed. Exam conducted with a chaperone present.  Constitutional:      Appearance: She is not ill-appearing.  HENT:     Head: Normocephalic.  Cardiovascular:     Rate and Rhythm: Normal rate and regular rhythm.  Pulmonary:     Effort: Pulmonary effort is normal.     Breath sounds: Normal breath sounds.  Abdominal:     General: Bowel sounds are normal. There is no distension.     Palpations: Abdomen is soft.     Tenderness: There is no abdominal tenderness.  Genitourinary:    Exam position: Lithotomy position.     Pubic Area: No rash.      Labia:        Right: No rash.        Left: No rash.      Comments: Your urethral region seems mildly enlarged, patient reports this is where she feels the lump, there is no erythema, drainage or visible lesions Neurological:     Mental Status: She is alert.      UC Treatments / Results  Labs (all labs ordered are listed, but only abnormal results are displayed) Labs Reviewed - No data to display  EKG   Radiology No results found.  Procedures Procedures (including critical care time)  Medications Ordered in UC Medications - No data to display  Initial Impression / Assessment and Plan / UC Course  I have reviewed the triage vital signs and the nursing notes.  Pertinent labs & imaging results that were available during my care of the patient were reviewed by  me and considered in my medical decision making (see chart for details).     Patient with right-sided pelvic pain for several months getting worse, recommend she see her PCP for  outpatient evaluation which may involve ultrasound and some lab testing.  She is also concerned about a lump she feels in her genital region.  On exam she has mild enlargement of the periurethral region discussed with patient this could be little inflammation of the Skene's gland, there is nothing on exam to suggest infection at this time.  Recommend she follow-up with GYN   Final Clinical Impressions(s) / UC Diagnoses   Final diagnoses:  Pelvic pain  Skene gland inflammation     Discharge Instructions      Follow-up with your primary care doctor regarding your pelvic pain as this may require lab work and imaging such as an ultrasound. Follow-up with a gynecologist regarding the lump you feel in the genital area.  Suspect this is a large amount of a Skene's gland   ED Prescriptions   None    PDMP not reviewed this encounter.   Meliton Rattan, [place] 06/09/23 1721

## 2023-06-09 NOTE — ED Triage Notes (Signed)
Abdominal Pain, Feels like a knot inside onset a few months ago. Only bothered her with her periods at first. Was seen in August and was told everything is normal. Now the sensation of a mass and abdominal pain has become constant and worse.   Was felt in the lower abdominal but now firmer and in the epigastric region. No NVD or constipation. No urinary symptoms.

## 2023-06-12 NOTE — Telephone Encounter (Signed)
Ok

## 2023-06-21 ENCOUNTER — Ambulatory Visit: Payer: 59 | Admitting: Family Medicine

## 2023-08-08 ENCOUNTER — Telehealth: Payer: Self-pay

## 2023-08-08 DIAGNOSIS — I10 Essential (primary) hypertension: Secondary | ICD-10-CM

## 2023-08-08 NOTE — Telephone Encounter (Signed)
Copied from CRM [id number]. Topic: Clinical - Prescription Issue >> Aug 08, 2023 11:54 AM Truddie Crumble wrote: Reason for CRM: pt called stating her BP medication was denied by the doctor and she want to know why  Called pt and advised no Rx refill request on file or in recent history, also not showing any denials for medication. Advised pt that during Aug 2024 appt was advised to closely monitor BP and come back in 3 months to discuss as medication may need to be adjusted. Appt was cancelled. Offered appt with PCP for this week and patient states will not work with work schedule. Pt advised pharmacy is faxing over a Rx refill request for review. Pt states need hydrochlorothiazide meds, has active Rx of HTN medication but not showing active Rx for Metoprolol. Advised would be looking for fax from pharmacy.

## 2023-08-09 ENCOUNTER — Other Ambulatory Visit: Payer: Self-pay

## 2023-08-09 MED ORDER — HYDROCHLOROTHIAZIDE 25 MG PO TABS: 25.0000 mg | ORAL_TABLET | Freq: Every day | ORAL | 0 refills | Status: DC

## 2023-08-09 MED ORDER — METOPROLOL SUCCINATE ER 50 MG PO TB24: 50.0000 mg | ORAL_TABLET | Freq: Every day | ORAL | 0 refills | Status: DC

## 2023-08-09 NOTE — Addendum Note (Signed)
Addended by: Asencion Partridge on: 08/09/2023 04:06 PM   Modules accepted: Orders

## 2023-10-18 ENCOUNTER — Encounter: Payer: Self-pay | Admitting: Family Medicine

## 2023-10-18 ENCOUNTER — Ambulatory Visit: Admitting: Family Medicine

## 2023-10-18 VITALS — BP 153/98 | HR 82 | Temp 97.7°F | Ht 62.5 in | Wt 164.2 lb

## 2023-10-18 DIAGNOSIS — R87610 Atypical squamous cells of undetermined significance on cytologic smear of cervix (ASC-US): Secondary | ICD-10-CM | POA: Insufficient documentation

## 2023-10-18 DIAGNOSIS — I10 Essential (primary) hypertension: Secondary | ICD-10-CM

## 2023-10-18 DIAGNOSIS — H8109 Meniere's disease, unspecified ear: Secondary | ICD-10-CM | POA: Diagnosis not present

## 2023-10-18 DIAGNOSIS — J4520 Mild intermittent asthma, uncomplicated: Secondary | ICD-10-CM | POA: Diagnosis not present

## 2023-10-18 LAB — CBC WITH DIFFERENTIAL/PLATELET
Basophils Absolute: 0.1 10*3/uL (ref 0.0–0.1)
Basophils Relative: 1.3 % (ref 0.0–3.0)
Eosinophils Absolute: 0.3 10*3/uL (ref 0.0–0.7)
Eosinophils Relative: 6.7 % — ABNORMAL HIGH (ref 0.0–5.0)
HCT: 40.2 % (ref 36.0–46.0)
Hemoglobin: 13.4 g/dL (ref 12.0–15.0)
Lymphocytes Relative: 40.1 % (ref 12.0–46.0)
Lymphs Abs: 1.7 10*3/uL (ref 0.7–4.0)
MCHC: 33.4 g/dL (ref 30.0–36.0)
MCV: 94.2 fl (ref 78.0–100.0)
Monocytes Absolute: 0.3 10*3/uL (ref 0.1–1.0)
Monocytes Relative: 7.8 % (ref 3.0–12.0)
Neutro Abs: 1.9 10*3/uL (ref 1.4–7.7)
Neutrophils Relative %: 44.1 % (ref 43.0–77.0)
Platelets: 295 10*3/uL (ref 150.0–400.0)
RBC: 4.27 Mil/uL (ref 3.87–5.11)
RDW: 13.8 % (ref 11.5–15.5)
WBC: 4.2 10*3/uL (ref 4.0–10.5)

## 2023-10-18 LAB — COMPREHENSIVE METABOLIC PANEL WITH GFR
ALT: 15 U/L (ref 0–35)
AST: 17 U/L (ref 0–37)
Albumin: 4.5 g/dL (ref 3.5–5.2)
Alkaline Phosphatase: 35 U/L — ABNORMAL LOW (ref 39–117)
BUN: 8 mg/dL (ref 6–23)
CO2: 25 meq/L (ref 19–32)
Calcium: 9.3 mg/dL (ref 8.4–10.5)
Chloride: 105 meq/L (ref 96–112)
Creatinine, Ser: 0.88 mg/dL (ref 0.40–1.20)
GFR: 79.35 mL/min (ref >60.00)
Glucose, Bld: 81 mg/dL (ref 70–99)
Potassium: 3.6 meq/L (ref 3.5–5.1)
Sodium: 137 meq/L (ref 135–145)
Total Bilirubin: 0.4 mg/dL (ref 0.2–1.2)
Total Protein: 7.3 g/dL (ref 6.0–8.3)

## 2023-10-18 LAB — LIPID PANEL
Cholesterol: 195 mg/dL (ref 0–200)
HDL: 68.6 mg/dL (ref >39.00)
LDL Cholesterol: 112 mg/dL — ABNORMAL HIGH (ref 0–99)
NonHDL: 126.55
Total CHOL/HDL Ratio: 3
Triglycerides: 74 mg/dL (ref 0.0–149.0)
VLDL: 14.8 mg/dL (ref 0.0–40.0)

## 2023-10-18 LAB — TSH: TSH: 0.96 u[IU]/mL (ref 0.35–5.50)

## 2023-10-18 MED ORDER — METOPROLOL SUCCINATE ER 50 MG PO TB24: 50.0000 mg | ORAL_TABLET | Freq: Every day | ORAL | 3 refills | Status: DC

## 2023-10-18 MED ORDER — HYDROCHLOROTHIAZIDE 25 MG PO TABS: 25.0000 mg | ORAL_TABLET | Freq: Every day | ORAL | 3 refills | Status: DC

## 2023-10-18 NOTE — Progress Notes (Signed)
 Subjective  CC:  Chief Complaint  Patient presents with   Hypertension    HPI: Jessica Hale is a 48 y.o. female who presents to the office today to address the problems listed above in the chief complaint. Hypertension f/u: Control is  unknown . Pt reports she is doing well. taking medications as instructed, no medication side effects noted, no TIAs, no chest pain on exertion, no dyspnea on exertion, no swelling of ankles. Ran out of meds 2 weeks ago. Last cpe 2023 and last blood work was then. She denies adverse effects from his BP medications. Compliance with medication is good.  GYN notes.  Normal GYN exam.  Reviewed Pap smear ASCUS with negative HPV.  I agree, next Pap smear due 2027 according to ASCCP guidelines. No vertigo Patient with history of mild intermittent asthma.  She denies complications or recent exacerbations.  She declines Prevnar 20.  Assessment  1. Essential hypertension   2. Cochlear hydrops, unspecified laterality   3. Atypical squamous cell changes of undetermined significance (ASCUS) on cervical cytology with negative high risk human papilloma virus (HPV) test result   4. Mild intermittent asthma without complication      Plan   Hypertension f/u: BP control is poorly controlled.  In part due to noncompliance but discussed need for closer monitoring so that we can be sure that she gets to control.  Refill metoprolol and HCTZ.  Check lipids and lab work today.  Check electrolytes.  Recommend follow-up in 3 months ASCUS Pap: For repeat testing in 2027. Mild intermittent asthma: Rare albuterol use.  Declines Prevnar vaccination.  Will continue to educate  Education regarding management of these chronic disease states was given. Management strategies discussed on successive visits include dietary and exercise recommendations, goals of achieving and maintaining IBW, and lifestyle modifications aiming for adequate sleep and minimizing stressors.   Follow up: 3  months for complete physical and follow-up blood pressure  Orders Placed This Encounter  Procedures   Lipid panel   Comprehensive metabolic panel with GFR   CBC with Differential/Platelet   TSH   Meds ordered this encounter  Medications   metoprolol succinate (TOPROL-XL) 50 MG 24 hr tablet    Sig: Take 1 tablet (50 mg total) by mouth daily.    Dispense:  90 tablet    Refill:  3   hydrochlorothiazide (HYDRODIURIL) 25 MG tablet    Sig: Take 1 tablet (25 mg total) by mouth daily.    Dispense:  90 tablet    Refill:  3      BP Readings from Last 3 Encounters:  10/18/23 (!) 153/98  06/09/23 (!) 161/99  03/01/23 (!) 164/100   Wt Readings from Last 3 Encounters:  10/18/23 164 lb 3.2 oz (74.5 kg)  06/09/23 160 lb (72.6 kg)  03/01/23 169 lb 12.8 oz (77 kg)    Lab Results  Component Value Date   CHOL 196 10/14/2020   CHOL 177 05/06/2019   CHOL 185 12/29/2017   Lab Results  Component Value Date   HDL 72.20 10/14/2020   HDL 59.40 05/06/2019   HDL 53.40 12/29/2017   Lab Results  Component Value Date   LDLCALC 114 (H) 10/14/2020   LDLCALC 103 (H) 05/06/2019   LDLCALC 108 (H) 12/29/2017   Lab Results  Component Value Date   TRIG 50.0 10/14/2020   TRIG 71.0 05/06/2019   TRIG 114.0 12/29/2017   Lab Results  Component Value Date   CHOLHDL 3 10/14/2020  CHOLHDL 3 05/06/2019   CHOLHDL 3 12/29/2017   No results found for: "LDLDIRECT" Lab Results  Component Value Date   CREATININE 0.95 10/14/2020   BUN 7 10/14/2020   NA 138 10/14/2020   K 3.7 10/14/2020   CL 102 10/14/2020   CO2 27 10/14/2020    The ASCVD Risk score (Arnett DK, et al., 2019) failed to calculate for the following reasons:   Cannot find a previous HDL lab   Cannot find a previous total cholesterol lab  I reviewed the patients updated PMH, FH, and SocHx.    Patient Active Problem List   Diagnosis Date Noted   Cervical radiculopathy 04/20/2018    Priority: High   Meniere's disease (cochlear  hydrops) 01/25/2013    Priority: High   Atypical migraine 05/02/2011    Priority: High   Essential hypertension 02/07/2007    Priority: High   Adjustment disorder with anxiety 07/07/2021    Priority: Medium    Carpal tunnel syndrome on right 08/01/2014    Priority: Medium    GERD 10/16/2007    Priority: Medium    Mild intermittent asthma 02/07/2007    Priority: Medium    Chronic periscapular pain on right side 01/01/2018    Priority: Low   Chronic right-sided low back pain without sciatica 08/01/2014    Priority: Low   Allergic rhinitis 04/10/2012    Priority: Low   Atypical squamous cell changes of undetermined significance (ASCUS) on cervical cytology with negative high risk human papilloma virus (HPV) test result 10/18/2023    Allergies: Bee venom and Other  Social History: Patient  reports that she quit smoking about 5 years ago. Her smoking use included cigarettes. She started smoking about 13 years ago. She has a 4 pack-year smoking history. She has never used smokeless tobacco. She reports current alcohol use. She reports that she does not currently use drugs after having used the following drugs: Marijuana.  Current Meds  Medication Sig   EPIPEN 2-PAK 0.3 MG/0.3ML SOAJ injection inject as directed WITH BEE STING   [DISCONTINUED] fluconazole (DIFLUCAN) 150 MG tablet Take one tablet today; may repeat in 3 days if symptoms persist   [DISCONTINUED] hydrochlorothiazide (HYDRODIURIL) 25 MG tablet Take 1 tablet (25 mg total) by mouth daily.   [DISCONTINUED] metoprolol succinate (TOPROL-XL) 50 MG 24 hr tablet Take 1 tablet (50 mg total) by mouth daily.    Review of Systems: Cardiovascular: negative for chest pain, palpitations, leg swelling, orthopnea Respiratory: negative for SOB, wheezing or persistent cough Gastrointestinal: negative for abdominal pain Genitourinary: negative for dysuria or gross hematuria  Objective  Vitals: BP (!) 153/98   Pulse 82   Temp 97.7 F  (36.5 C)   Ht 5' 2.5" (1.588 m)   Wt 164 lb 3.2 oz (74.5 kg)   SpO2 100%   BMI 29.55 kg/m  General: no acute distress  Psych:  Alert and oriented, normal mood and affect HEENT:  Normocephalic, atraumatic, supple neck  Cardiovascular:  RRR without murmur. no edema Respiratory:  Good breath sounds bilaterally, CTAB with normal respiratory effort Neurologic:   Mental status is normal Commons side effects, risks, benefits, and alternatives for medications and treatment plan prescribed today were discussed, and the patient expressed understanding of the given instructions. Patient is instructed to call or message via MyChart if he/she has any questions or concerns regarding our treatment plan. No barriers to understanding were identified. We discussed Red Flag symptoms and signs in detail. Patient expressed understanding regarding what  to do in case of urgent or emergency type symptoms.  Medication list was reconciled, printed and provided to the patient in AVS. Patient instructions and summary information was reviewed with the patient as documented in the AVS. This note was prepared with assistance of Dragon voice recognition software. Occasional wrong-word or sound-a-like substitutions may have occurred due to the inherent limitation

## 2023-10-18 NOTE — Patient Instructions (Signed)
 Please return in 3 months for your annual complete physical; please come fasting.    I will release your lab results to you on your MyChart account with further instructions. You may see the results before I do, but when I review them I will send you a message with my report or have my assistant call you if things need to be discussed. Please reply to my message with any questions. Thank you!   If you have any questions or concerns, please don't hesitate to send me a message via MyChart or call the office at [phone]. Thank you for visiting with [place] today! It's our pleasure caring for you.   VISIT SUMMARY:  Today, you came in for a refill of your blood pressure medication. You mentioned that you have been out of your medication for about three days and have had occasional high blood pressure readings, which you think are due to stress. You have not been checking your blood pressure regularly and have not experienced any symptoms related to high blood pressure. We discussed the importance of regular monitoring and scheduled some follow-up actions.  YOUR PLAN:  -HYPERTENSION: Hypertension means high blood pressure, which can lead to serious health problems if not managed properly. We have refilled your blood pressure medication and ordered blood work to check your kidney function and other important parameters. Please monitor your blood pressure at home regularly and bring your readings to your next appointment in three months.  -GENERAL HEALTH MAINTENANCE: Regular health check-ups and blood work are important for monitoring your overall health. We recommend that you have an annual physical examination and complete the blood work that has been ordered.  -CERVICAL CANCER SCREENING: Cervical cancer screening helps detect any changes in the cervix that could lead to cancer. Your last Pap smear was in August, and your next screening is due in 2027. We have scheduled your next Pap smear for  2027.  INSTRUCTIONS:  Please schedule a follow-up appointment in three months for a blood pressure check. Also, complete the ordered blood work as soon as possible. Make sure to monitor your blood pressure at home regularly and bring the readings to your next appointment.

## 2023-10-21 ENCOUNTER — Encounter: Payer: Self-pay | Admitting: Family Medicine

## 2023-10-21 NOTE — Progress Notes (Signed)
 Labs reviewed.  The 10-year ASCVD risk score (Arnett DK, et al., 2019) is: 3.5%   Values used to calculate the score:     Age: 48 years     Sex: Female     Is Non-Hispanic African American: Yes     Diabetic: No     Tobacco smoker: No     Systolic Blood Pressure: 153 mmHg     Is BP treated: Yes     HDL Cholesterol: 68.6 mg/dL     Total Cholesterol: 195 mg/dL  Dear Jessica Hale, Thank you for allowing me to care for you at your recent office visit.  I wanted to let you know that I have reviewed your lab test results and am happy to report that they are all stable.  I will see you again soon for follow up.   Sincerely, Dr. Mardelle Matte

## 2023-10-22 ENCOUNTER — Encounter (HOSPITAL_COMMUNITY): Payer: Self-pay | Admitting: *Deleted

## 2023-10-22 ENCOUNTER — Emergency Department (HOSPITAL_COMMUNITY)
Admission: EM | Admit: 2023-10-22 | Discharge: 2023-10-23 | Disposition: A | Discharge status: Home / Self Care | Attending: Emergency Medicine | Admitting: Emergency Medicine

## 2023-10-22 ENCOUNTER — Other Ambulatory Visit: Payer: Self-pay

## 2023-10-22 DIAGNOSIS — R102 Pelvic and perineal pain: Secondary | ICD-10-CM | POA: Diagnosis not present

## 2023-10-22 DIAGNOSIS — Z79899 Other long term (current) drug therapy: Secondary | ICD-10-CM | POA: Diagnosis not present

## 2023-10-22 DIAGNOSIS — R109 Unspecified abdominal pain: Secondary | ICD-10-CM | POA: Diagnosis present

## 2023-10-22 DIAGNOSIS — D259 Leiomyoma of uterus, unspecified: Secondary | ICD-10-CM | POA: Diagnosis not present

## 2023-10-22 DIAGNOSIS — D219 Benign neoplasm of connective and other soft tissue, unspecified: Secondary | ICD-10-CM

## 2023-10-22 DIAGNOSIS — J45909 Unspecified asthma, uncomplicated: Secondary | ICD-10-CM | POA: Diagnosis not present

## 2023-10-22 DIAGNOSIS — I10 Essential (primary) hypertension: Secondary | ICD-10-CM | POA: Insufficient documentation

## 2023-10-22 LAB — CBC
HCT: 39.5 % (ref 36.0–46.0)
Hemoglobin: 13.4 g/dL (ref 12.0–15.0)
MCH: 30.9 pg (ref 26.0–34.0)
MCHC: 33.9 g/dL (ref 30.0–36.0)
MCV: 91.2 fL (ref 80.0–100.0)
Platelets: 291 10*3/uL (ref 150–400)
RBC: 4.33 MIL/uL (ref 3.87–5.11)
RDW: 13.2 % (ref 11.5–15.5)
WBC: 6.6 10*3/uL (ref 4.0–10.5)
nRBC: 0 % (ref 0.0–0.2)

## 2023-10-22 LAB — COMPREHENSIVE METABOLIC PANEL WITH GFR
ALT: 16 U/L (ref 0–44)
AST: 20 U/L (ref 15–41)
Albumin: 3.9 g/dL (ref 3.5–5.0)
Alkaline Phosphatase: 37 U/L — ABNORMAL LOW (ref 38–126)
Anion gap: 13 (ref 5–15)
BUN: 13 mg/dL (ref 6–20)
CO2: 20 mmol/L — ABNORMAL LOW (ref 22–32)
Calcium: 9.7 mg/dL (ref 8.9–10.3)
Chloride: 104 mmol/L (ref 98–111)
Creatinine, Ser: 0.98 mg/dL (ref 0.44–1.00)
GFR, Estimated: >60 mL/min (ref >60)
Glucose, Bld: 93 mg/dL (ref 70–99)
Potassium: 3.5 mmol/L (ref 3.5–5.1)
Sodium: 137 mmol/L (ref 135–145)
Total Bilirubin: 0.5 mg/dL (ref 0.0–1.2)
Total Protein: 7 g/dL (ref 6.5–8.1)

## 2023-10-22 LAB — URINALYSIS, ROUTINE W REFLEX MICROSCOPIC
Bacteria, UA: NONE SEEN
Bilirubin Urine: NEGATIVE
Glucose, UA: NEGATIVE mg/dL
Hgb urine dipstick: NEGATIVE
Ketones, ur: NEGATIVE mg/dL
Leukocytes,Ua: NEGATIVE
Nitrite: NEGATIVE
Protein, ur: NEGATIVE mg/dL
Specific Gravity, Urine: 1.008 (ref 1.005–1.030)
pH: 6 (ref 5.0–8.0)

## 2023-10-22 LAB — HCG, SERUM, QUALITATIVE: Preg, Serum: NEGATIVE

## 2023-10-22 LAB — LIPASE, BLOOD: Lipase: 32 U/L (ref 11–51)

## 2023-10-22 NOTE — ED Triage Notes (Signed)
 The pt is c/o abd pain since Friday  she has had this pain for several weeks  the pain has localized in her rt lower abd and she  feels a knot there  lmp 3-22

## 2023-10-23 ENCOUNTER — Emergency Department (HOSPITAL_COMMUNITY)

## 2023-10-23 MED ORDER — IOHEXOL 350 MG/ML SOLN
75.0000 mL | Freq: Once | INTRAVENOUS | Status: AC | PRN
  Administered 2023-10-23: 75 mL via INTRAVENOUS

## 2023-10-23 MED ORDER — KETOROLAC TROMETHAMINE 15 MG/ML IJ SOLN
15.0000 mg | Freq: Once | INTRAMUSCULAR | Status: AC
  Administered 2023-10-23: 15 mg via INTRAVENOUS
  Filled 2023-10-23: qty 1

## 2023-10-23 MED ORDER — MELOXICAM 15 MG PO TABS: 15.0000 mg | ORAL_TABLET | Freq: Every day | ORAL | 0 refills | Status: DC | PRN

## 2023-10-23 NOTE — ED Provider Notes (Signed)
 [place] EMERGENCY DEPARTMENT AT [facility] Provider Note   CSN: [id number] Arrival date & time: 10/22/23  2157     History  Chief Complaint  Patient presents with   Abdominal Pain    Jessica Hale is a 48 y.o. female.   Abdominal Pain   48 year old female presents emergency department with complaints of right inguinal pain.  States that she has been having pain since Friday.  Reports pain constant since onset with acute worsening over the past 24 hours or so.  States that she has had a history of similar symptoms in the past occurring 4 times.  Has been seen by primary care, OB/GYN, urgent care with reassuring officiates.  Patient states she did Motrin yesterday evening which did help with her symptoms.  Denies any fevers, chills, nausea, vomiting, urinary symptoms, vaginal symptoms, change in bowel habits.  States that the pain/mass seem to be more prominent around her menstrual cycle but she is currently not due for menstruation until the 21st of the month.  Patient does report history of ovarian cysts have not been complicated in the past.  Past medical history significant for hypertension, migraine, GERD, anemia, asthma, Mnire's disease, cervical radiculopathy  Home Medications Prior to Admission medications   Medication Sig Start Date End Date Taking? Authorizing Provider  EPIPEN 2-PAK 0.3 MG/0.3ML SOAJ injection inject as directed WITH BEE STING 12/25/14   [provider]  hydrochlorothiazide (HYDRODIURIL) 25 MG tablet Take 1 tablet (25 mg total) by mouth daily. 10/18/23   Willow Ora, MD  metoprolol succinate (TOPROL-XL) 50 MG 24 hr tablet Take 1 tablet (50 mg total) by mouth daily. 10/18/23   Willow Ora, MD  albuterol (VENTOLIN HFA) 108 (90 Base) MCG/ACT inhaler Inhale 2 puffs into the lungs every 6 (six) hours as needed for wheezing or shortness of breath. 04/11/19 11/30/20  Helane Rima, DO      Allergies    Bee venom and Other    Review of  Systems   Review of Systems  Gastrointestinal:  Positive for abdominal pain.  All other systems reviewed and are negative.   Physical Exam Updated Vital Signs BP (!) 155/101 (BP Location: Left Arm)   Pulse 63   Temp 98.9 F (37.2 C)   Resp 16   Ht 5\' 2"  (1.575 m)   Wt 74.5 kg   LMP 10/07/2023   SpO2 100%   BMI 30.04 kg/m  Physical Exam Vitals and nursing note reviewed. Exam conducted with a chaperone present.  Constitutional:      General: She is not in acute distress.    Appearance: She is well-developed.  HENT:     Head: Normocephalic and atraumatic.  Eyes:     Conjunctiva/sclera: Conjunctivae normal.  Cardiovascular:     Rate and Rhythm: Normal rate and regular rhythm.     Heart sounds: No murmur heard. Pulmonary:     Effort: Pulmonary effort is normal. No respiratory distress.     Breath sounds: Normal breath sounds.  Abdominal:     Palpations: Abdomen is soft.     Tenderness: There is no abdominal tenderness.  Genitourinary:    Comments: Patient with tenderness right lower pelvic area/medial of right inguinal region.  No obvious palpable hernia with Valsalva in area.  No overlying erythema, palpable fluctuance/induration.  No obvious lymphadenopathy present. Musculoskeletal:        General: No swelling.     Cervical back: Neck supple.  Skin:    General:  Skin is warm and dry.     Capillary Refill: Capillary refill takes less than 2 seconds.  Neurological:     Mental Status: She is alert.  Psychiatric:        Mood and Affect: Mood normal.     ED Results / Procedures / Treatments   Labs (all labs ordered are listed, but only abnormal results are displayed) Labs Reviewed  COMPREHENSIVE METABOLIC PANEL WITH GFR - Abnormal; Notable for the following components:      Result Value   CO2 20 (*)    Alkaline Phosphatase 37 (*)    All other components within normal limits  URINALYSIS, ROUTINE W REFLEX MICROSCOPIC - Abnormal; Notable for the following components:    Color, Urine STRAW (*)    All other components within normal limits  LIPASE, BLOOD  CBC  HCG, SERUM, QUALITATIVE    EKG None  Radiology No results found.  Procedures Procedures    Medications Ordered in ED Medications - No data to display  ED Course/ Medical Decision Making/ A&P                                 Medical Decision Making Amount and/or Complexity of Data Reviewed Radiology: ordered.  Risk Prescription drug management.   This patient presents to the ED for concern of right inguinal pain, this involves an extensive number of treatment options, and is a complaint that carries with it a high risk of complications and morbidity.  The differential diagnosis includes hernia, lymphadenopathy, ovarian cyst, fibroid, malignancy, abscess, other   Co morbidities that complicate the patient evaluation  See HPI   Additional history obtained:  Additional history obtained from EMR External records from outside source obtained and reviewed including hospital records   Lab Tests:  I Ordered, and personally interpreted labs.  The pertinent results include: No leukocytosis.  No evidence of anemia.  Platelets within range.  Mild decrease in bicarb of 20 but otherwise, electrolytes within normal limits.  No transaminitis.  No renal dysfunction.  UA without abnormality.  Lipase and hCG negative.   Imaging Studies ordered:  I ordered imaging studies including CT abdomen pelvis I independently visualized and interpreted imaging which showed uterine fibroids I agree with the radiologist interpretation   Cardiac Monitoring: / EKG:  The patient was maintained on a cardiac monitor.  I personally viewed and interpreted the cardiac monitored which showed an underlying rhythm of: Sinus rhythm   Consultations Obtained:  N/a   Problem List / ED Course / Critical interventions / Medication management  Pelvic pain, fibroids I ordered medication including Toradol    Reevaluation of the patient after these medicines showed that the patient improved I have reviewed the patients home medicines and have made adjustments as needed   Social Determinants of Health:  Denies tobacco, licit drug use.   Test / Admission - Considered:  Pelvic pain, fibroids Vitals signs significant for hypertension. Otherwise within normal range and stable throughout visit. Laboratory/imaging studies significant for: See above 48 year old female presents emergency department with complaints of right inguinal pain.  States that she has been having pain since Friday.  Reports pain constant since onset with acute worsening over the past 24 hours or so.  States that she has had a history of similar symptoms in the past occurring 4 times.  Has been seen by primary care, OB/GYN, urgent care with reassuring officiates.  Patient states she  did Motrin yesterday evening which did help with her symptoms.  Denies any fevers, chills, nausea, vomiting, urinary symptoms, vaginal symptoms, change in bowel habits.  States that the pain/mass seem to be more prominent around her menstrual cycle but she is currently not due for menstruation until the 21st of the month.  Patient does report history of ovarian cysts have not been complicated in the past. On exam, patient with tenderness suprapubic/pelvic region with right lateralization.  Labs unremarkable for any acute process.  CT imaging concerning for uterine fibroids which is consistent with patient's history of fluctuating pain and reported "swelling" in lower abdomen with menstrual cycle.  Would recommend continues of NSAIDs at home and follow-up with OB/GYN in the outpatient setting.  Treatment plan discussed with patient and she acknowledged understanding was agreeable to said plan.  Patient over well-appearing, afebrile in no acute distress. Worrisome signs and symptoms were discussed with the patient, and the patient acknowledged understanding to  return to the ED if noticed. Patient was stable upon discharge.          Final Clinical Impression(s) / ED Diagnoses Final diagnoses:  None    Rx / DC Orders ED Discharge Orders     None         Peter Garter, [place] 10/23/23 [id number]    Shon Baton, MD 10/23/23 [phone]

## 2023-10-23 NOTE — Discharge Instructions (Addendum)
 As discussed, you do have several fibroids within your uterus which is most likely causing the swelling sensation as well as pain related to menstrual cycle.  Continue take NSAIDs for pain.  Recommend follow-up with your OB/GYN for reassessment.  Please do not hesitate to return to the emergency department if the worrisome signs and symptoms we discussed become apparent.

## 2023-10-23 NOTE — ED Notes (Signed)
 EDP at bedside; chaperoned pelvic exam for [place], [place]

## 2023-10-27 ENCOUNTER — Other Ambulatory Visit: Payer: Self-pay | Admitting: Nurse Practitioner

## 2023-10-27 DIAGNOSIS — R590 Localized enlarged lymph nodes: Secondary | ICD-10-CM

## 2023-10-28 ENCOUNTER — Encounter: Payer: Self-pay | Admitting: Family Medicine

## 2023-10-28 DIAGNOSIS — I10 Essential (primary) hypertension: Secondary | ICD-10-CM

## 2023-10-31 ENCOUNTER — Other Ambulatory Visit: Payer: Self-pay | Admitting: Family

## 2023-10-31 MED ORDER — LOSARTAN POTASSIUM-HCTZ 50-12.5 MG PO TABS: 1.0000 | ORAL_TABLET | Freq: Every day | ORAL | 2 refills | Status: DC

## 2023-11-03 ENCOUNTER — Other Ambulatory Visit

## 2023-11-03 ENCOUNTER — Ambulatory Visit
Admission: RE | Admit: 2023-11-03 | Discharge: 2023-11-03 | Disposition: A | Discharge status: Home / Self Care | Source: Ambulatory Visit | Attending: Nurse Practitioner | Admitting: Nurse Practitioner

## 2023-11-03 DIAGNOSIS — R590 Localized enlarged lymph nodes: Secondary | ICD-10-CM

## 2023-11-08 MED ORDER — HYDROCHLOROTHIAZIDE 25 MG PO TABS: 25.0000 mg | ORAL_TABLET | Freq: Every day | ORAL | 3 refills | Status: DC

## 2023-11-14 NOTE — Telephone Encounter (Signed)
 Noted.

## 2023-11-22 ENCOUNTER — Encounter: Payer: Self-pay | Admitting: Family Medicine

## 2023-11-22 ENCOUNTER — Ambulatory Visit: Admitting: Family Medicine

## 2023-11-22 VITALS — BP 135/86 | HR 83 | Temp 98.1°F | Ht 62.0 in | Wt 157.6 lb

## 2023-11-22 DIAGNOSIS — D259 Leiomyoma of uterus, unspecified: Secondary | ICD-10-CM

## 2023-11-22 DIAGNOSIS — I10 Essential (primary) hypertension: Secondary | ICD-10-CM

## 2023-11-22 DIAGNOSIS — R102 Pelvic and perineal pain: Secondary | ICD-10-CM | POA: Diagnosis not present

## 2023-11-22 MED ORDER — TRIAMTERENE-HCTZ 37.5-25 MG PO TABS: 1.0000 | ORAL_TABLET | Freq: Every day | ORAL | 3 refills | Status: AC

## 2023-11-22 NOTE — Progress Notes (Signed)
 lll  Subjective  CC:  Chief Complaint  Patient presents with   Hypertension    HPI: Jessica Hale is a 48 y.o. female who presents to the office today to address the problems listed above in the chief complaint. Hypertension f/u:  Discussed the use of AI scribe software for clinical note transcription with the patient, who gave verbal consent to proceed.  History of Present Illness Jessica Hale "Jessica Hale" is a 48 year old female with hypertension who presents for follow-up of her blood pressure management and pelvic pain.  Her blood pressure has improved significantly,  taking metoprolol  xl 50 and hydrochlorothiazide  25 and supplement hawthorne berry x 1 week with recent readings around 133/87 mmHg. She attributes this improvement partly to a supplement she has been taking for the past two weeks and confirms adherence to her medication regimen.  She has been experiencing pelvic pain, which recently became severe enough to require medical attention. During this evaluation, she was informed about the presence of fibroids. The pain is described as severe and has been impacting her quality of life. I reviewed er notes.  Regarding her menstrual cycles, they have become lighter, lasting only two to three days. She is concerned about the possibility of menopause, given her age, but has not experienced heavy bleeding. No heavy menstrual bleeding, but she notes lighter cycles.  She felt dizzy and off balance prior to her blood pressure being adjusted.   Assessment  1. Essential hypertension   2. Uterine leiomyoma, unspecified location   3. Pelvic pain      Plan  Assessment and Plan Assessment & Plan Uterine fibroids Severe pelvic pain due to uterine fibroids. Light menstrual cycles likely due to fibroid location. - Proceed with gynecological consultation on May 19th to discuss fibroid removal options.  Hypertension Hypertension better controlled with recent reading of 133/87 mmHg. New  supplement may aid control. Plan to switch to combination medication for improved management. - Continue metoprolol  xl 50 daily - Switch hydrochlorothiazide  to triamterene-hydrochlorothiazide . 37.5/25 daily - Monitor blood pressure regularly. - Reassess in six months during physical examination.   Education regarding management of these chronic disease states was given. Management strategies discussed on successive visits include dietary and exercise recommendations, goals of achieving and maintaining IBW, and lifestyle modifications aiming for adequate sleep and minimizing stressors.  Follow up: 6 mo for cpe and f/u htn  No orders of the defined types were placed in this encounter.  Meds ordered this encounter  Medications   triamterene-hydrochlorothiazide  (MAXZIDE-25) 37.5-25 MG tablet    Sig: Take 1 tablet by mouth daily.    Dispense:  90 tablet    Refill:  3      BP Readings from Last 3 Encounters:  11/22/23 135/86  10/23/23 (!) 171/102  10/18/23 (!) 153/98   Wt Readings from Last 3 Encounters:  11/22/23 157 lb 9.6 oz (71.5 kg)  10/22/23 164 lb 3.9 oz (74.5 kg)  10/18/23 164 lb 3.2 oz (74.5 kg)    Lab Results  Component Value Date   CHOL 195 10/18/2023   CHOL 196 10/14/2020   CHOL 177 05/06/2019   Lab Results  Component Value Date   HDL 68.60 10/18/2023   HDL 72.20 10/14/2020   HDL 59.40 05/06/2019   Lab Results  Component Value Date   LDLCALC 112 (H) 10/18/2023   LDLCALC 114 (H) 10/14/2020   LDLCALC 103 (H) 05/06/2019   Lab Results  Component Value Date   TRIG 74.0 10/18/2023  TRIG 50.0 10/14/2020   TRIG 71.0 05/06/2019   Lab Results  Component Value Date   CHOLHDL 3 10/18/2023   CHOLHDL 3 10/14/2020   CHOLHDL 3 05/06/2019   No results found for: "LDLDIRECT" Lab Results  Component Value Date   CREATININE 0.98 10/22/2023   BUN 13 10/22/2023   NA 137 10/22/2023   K 3.5 10/22/2023   CL 104 10/22/2023   CO2 20 (L) 10/22/2023    The 10-year  ASCVD risk score (Arnett DK, et al., 2019) is: 1.9%   Values used to calculate the score:     Age: 75 years     Sex: Female     Is Non-Hispanic African American: Yes     Diabetic: No     Tobacco smoker: No     Systolic Blood Pressure: 135 mmHg     Is BP treated: Yes     HDL Cholesterol: 68.6 mg/dL     Total Cholesterol: 195 mg/dL  I reviewed the patients updated PMH, FH, and SocHx.    Patient Active Problem List   Diagnosis Date Noted   Cervical radiculopathy 04/20/2018    Priority: High   Meniere's disease (cochlear hydrops) 01/25/2013    Priority: High   Atypical migraine 05/02/2011    Priority: High   Essential hypertension 02/07/2007    Priority: High   Adjustment disorder with anxiety 07/07/2021    Priority: Medium    Carpal tunnel syndrome on right 08/01/2014    Priority: Medium    GERD 10/16/2007    Priority: Medium    Mild intermittent asthma 02/07/2007    Priority: Medium    Chronic periscapular pain on right side 01/01/2018    Priority: Low   Chronic right-sided low back pain without sciatica 08/01/2014    Priority: Low   Allergic rhinitis 04/10/2012    Priority: Low   Atypical squamous cell changes of undetermined significance (ASCUS) on cervical cytology with negative high risk human papilloma virus (HPV) test result 10/18/2023    Allergies: Bee venom and Other  Social History: Patient  reports that she quit smoking about 5 years ago. Her smoking use included cigarettes. She started smoking about 13 years ago. She has a 4 pack-year smoking history. She has never used smokeless tobacco. She reports current alcohol use. She reports that she does not currently use drugs after having used the following drugs: Marijuana.  Current Meds  Medication Sig   EPIPEN 2-PAK 0.3 MG/0.3ML SOAJ injection inject as directed WITH BEE STING   HAWTHORN BERRY PO Take by mouth.   meloxicam  (MOBIC ) 15 MG tablet Take 1 tablet (15 mg total) by mouth daily as needed for pain.    metoprolol  succinate (TOPROL -XL) 50 MG 24 hr tablet Take 1 tablet (50 mg total) by mouth daily.   triamterene-hydrochlorothiazide  (MAXZIDE-25) 37.5-25 MG tablet Take 1 tablet by mouth daily.   [DISCONTINUED] hydrochlorothiazide  (HYDRODIURIL ) 25 MG tablet Take 1 tablet (25 mg total) by mouth daily.    Review of Systems: Cardiovascular: negative for chest pain, palpitations, leg swelling, orthopnea Respiratory: negative for SOB, wheezing or persistent cough Gastrointestinal: negative for abdominal pain Genitourinary: negative for dysuria or gross hematuria  Objective  Vitals: BP 135/86   Pulse 83   Temp 98.1 F (36.7 C)   Ht 5\' 2"  (1.575 m)   Wt 157 lb 9.6 oz (71.5 kg)   LMP 10/07/2023   SpO2 100%   BMI 28.83 kg/m  General: no acute distress  Psych:  Alert and  oriented, normal mood and affect HEENT:  Normocephalic, atraumatic, supple neck  Cardiovascular:  RRR without murmur. no edema Neurologic:   Mental status is normal Commons side effects, risks, benefits, and alternatives for medications and treatment plan prescribed today were discussed, and the patient expressed understanding of the given instructions. Patient is instructed to call or message via MyChart if he/she has any questions or concerns regarding our treatment plan. No barriers to understanding were identified. We discussed Red Flag symptoms and signs in detail. Patient expressed understanding regarding what to do in case of urgent or emergency type symptoms.  Medication list was reconciled, printed and provided to the patient in AVS. Patient instructions and summary information was reviewed with the patient as documented in the AVS. This note was prepared with assistance of Dragon voice recognition software. Occasional wrong-word or sound-a-like substitutions may have occurred due to the inherent limitation

## 2023-11-22 NOTE — Patient Instructions (Signed)
 Please return in 6 months for your annual complete physical; please come fasting.  F/u htn  If you have any questions or concerns, please don't hesitate to send me a message via MyChart or call the office at [phone]. Thank you for visiting with us  today! It's our pleasure caring for you.    VISIT SUMMARY: During your visit, we discussed your blood pressure management and addressed your pelvic pain. Your blood pressure has improved, and we talked about your current medications and a new supplement you have been taking. We also discussed your severe pelvic pain, which is due to uterine fibroids, and your lighter menstrual cycles.  YOUR PLAN: -UTERINE FIBROIDS: Uterine fibroids are non-cancerous growths in the uterus that can cause severe pelvic pain and changes in menstrual cycles. You will have a gynecological consultation on May 19th to discuss options for removing the fibroids.  -HYPERTENSION: Hypertension, or high blood pressure, is better controlled with your recent reading of 133/87 mmHg. You will continue taking metoprolol  and switch from hydrochlorothiazide  to a combination medication called triamterene-hydrochlorothiazide . Please monitor your blood pressure regularly and we will reassess in six months during your physical examination.  INSTRUCTIONS: Please attend your gynecological consultation on May 19th to discuss fibroid removal options. Continue taking metoprolol  and switch to triamterene-hydrochlorothiazide  as discussed. Monitor your blood pressure regularly and we will reassess in six months during your physical examination.                      Contains text generated by Abridge.                                 Contains text generated by Abridge.

## 2023-12-13 ENCOUNTER — Encounter (INDEPENDENT_AMBULATORY_CARE_PROVIDER_SITE_OTHER): Admitting: Primary Care

## 2024-01-10 ENCOUNTER — Encounter: Payer: Self-pay | Admitting: Family Medicine

## 2024-01-10 ENCOUNTER — Ambulatory Visit: Admitting: Family Medicine

## 2024-01-10 VITALS — BP 124/82 | HR 87 | Temp 98.1°F | Ht 62.0 in | Wt 159.4 lb

## 2024-01-10 DIAGNOSIS — I10 Essential (primary) hypertension: Secondary | ICD-10-CM | POA: Diagnosis not present

## 2024-01-10 DIAGNOSIS — D259 Leiomyoma of uterus, unspecified: Secondary | ICD-10-CM

## 2024-01-10 DIAGNOSIS — R102 Pelvic and perineal pain: Secondary | ICD-10-CM | POA: Diagnosis not present

## 2024-01-10 MED ORDER — METOPROLOL SUCCINATE ER 100 MG PO TB24: 100.0000 mg | ORAL_TABLET | Freq: Every day | ORAL | 3 refills | Status: AC

## 2024-01-10 NOTE — Progress Notes (Signed)
 Subjective  CC:  Chief Complaint  Patient presents with   Surgery Questions    Pt is planning on having a hysterectomy and wanted to ask some questions about it     HPI: Jessica Hale is a 48 y.o. female who presents to the office today to address the problems listed above in the chief complaint. Discussed the use of AI scribe software for clinical note transcription with the patient, who gave verbal consent to proceed.  History of Present Illness Jessica Hale is a 48 year old female with pelvic pain who presents for evaluation of her symptoms and management of her gynecological care. She is accompanied by her niece, Taylin, also called [place].  She experiences intermittent pelvic pain, primarily occurring when her menstrual cycle is about to start. The pain is currently less severe than it was three months ago.  She is exploring options for her gynecological care to consolidate her medical bills through a single provider. I have reviewed gyn consults and notes and ultrasound findings.  She has a history of hypertension and is currently taking metoprolol  and a diuretic. Her blood pressure readings are generally around 135/90 mmHg or 130/80 mmHg. She is compliant with her medication regimen.   Assessment  1. Uterine leiomyoma, unspecified location   2. Pelvic pain   3. Essential hypertension      Plan  Assessment and Plan Assessment & Plan Uterine Pain Intermittent premenstrual uterine pain with suspected structural issues. Pain severity decreased. Prefers [facility] for potential hysterectomy. - Refer to [facility] for evaluation and potential hysterectomy. - Coordinate with insurance for coverage and billing.  Hypertension Blood pressure improving but not optimal at 135/90 mmHg. On metoprolol  and diuretic. Plan to increase metoprolol  for better control. - Increase metoprolol  to 100 mg. - Continue current diuretic. Maxzide 25 daily - Monitor  blood pressure regularly.    Follow up: as scheduled 05/29/2024   Orders Placed This Encounter  Procedures   Ambulatory referral to Obstetrics / Gynecology   Meds ordered this encounter  Medications   metoprolol  succinate (TOPROL -XL) 100 MG 24 hr tablet    Sig: Take 1 tablet (100 mg total) by mouth daily.    Dispense:  90 tablet    Refill:  3     I reviewed the patients updated PMH, FH, and SocHx.  Patient Active Problem List   Diagnosis Date Noted   Cervical radiculopathy 04/20/2018    Priority: High   Meniere's disease (cochlear hydrops) 01/25/2013    Priority: High   Atypical migraine 05/02/2011    Priority: High   Essential hypertension 02/07/2007    Priority: High   Adjustment disorder with anxiety 07/07/2021    Priority: Medium    Carpal tunnel syndrome on right 08/01/2014    Priority: Medium    GERD 10/16/2007    Priority: Medium    Mild intermittent asthma 02/07/2007    Priority: Medium    Chronic periscapular pain on right side 01/01/2018    Priority: Low   Chronic right-sided low back pain without sciatica 08/01/2014    Priority: Low   Allergic rhinitis 04/10/2012    Priority: Low   Atypical squamous cell changes of undetermined significance (ASCUS) on cervical cytology with negative high risk human papilloma virus (HPV) test result 10/18/2023   Current Meds  Medication Sig   EPIPEN 2-PAK 0.3 MG/0.3ML SOAJ injection inject as directed WITH BEE STING   HAWTHORN BERRY PO Take by mouth.  meloxicam  (MOBIC ) 15 MG tablet Take 1 tablet (15 mg total) by mouth daily as needed for pain.   triamterene -hydrochlorothiazide  (MAXZIDE-25) 37.5-25 MG tablet Take 1 tablet by mouth daily.   [DISCONTINUED] metoprolol  succinate (TOPROL -XL) 50 MG 24 hr tablet Take 1 tablet (50 mg total) by mouth daily.   Allergies: Patient is allergic to bee venom and other. Family History: Patient family history includes Heart attack in her maternal grandmother; Hypertension in her  maternal grandmother; Lung cancer in her maternal aunt. Social History:  Patient  reports that she quit smoking about 5 years ago. Her smoking use included cigarettes. She started smoking about 13 years ago. She has a 4 pack-year smoking history. She has never used smokeless tobacco. She reports current alcohol use. She reports that she does not currently use drugs after having used the following drugs: Marijuana.  Review of Systems: Constitutional: Negative for fever malaise or anorexia Cardiovascular: negative for chest pain Respiratory: negative for SOB or persistent cough Gastrointestinal: negative for abdominal pain  Objective  Vitals: BP 124/82   Pulse 87   Temp 98.1 F (36.7 C)   Ht 5' 2 (1.575 m)   Wt 159 lb 6.4 oz (72.3 kg)   SpO2 99%   BMI 29.15 kg/m  General: no acute distress , A&Ox3 HEENT: PEERL, conjunctiva normal, neck is supple Cardiovascular:  RRR without murmur or gallop.  Respiratory:  Good breath sounds bilaterally, CTAB with normal respiratory effort  Commons side effects, risks, benefits, and alternatives for medications and treatment plan prescribed today were discussed, and the patient expressed understanding of the given instructions. Patient is instructed to call or message via MyChart if he/she has any questions or concerns regarding our treatment plan. No barriers to understanding were identified. We discussed Red Flag symptoms and signs in detail. Patient expressed understanding regarding what to do in case of urgent or emergency type symptoms.  Medication list was reconciled, printed and provided to the patient in AVS. Patient instructions and summary information was reviewed with the patient as documented in the AVS. This note was prepared with assistance of Dragon voice recognition software. Occasional wrong-word or sound-a-like substitutions may have occurred due to the inherent limitations of voice recognition software

## 2024-01-10 NOTE — Patient Instructions (Signed)
 Please follow up as scheduled for your next visit with me: 05/29/2024   If you have any questions or concerns, please don't hesitate to send me a message via MyChart or call the office at [phone]. Thank you for visiting with us  today! It's our pleasure caring for you.    VISIT SUMMARY: You came in today to discuss your chronic pelvic pain and manage your gynecological care. You were accompanied by your niece, [place]. We also reviewed your hypertension management.  YOUR PLAN: -UTERINE PAIN: You have intermittent pelvic pain that occurs mainly before your menstrual cycle, which may be due to structural issues in your uterus. The pain has decreased in severity over the past three months. We will refer you to a [facility] to evaluate the need for a potential hysterectomy. We will also coordinate with your insurance for coverage and billing.  -HYPERTENSION: Your blood pressure is improving but still not at the optimal level. Hypertension means your blood pressure is consistently too high, which can lead to other health problems. We will increase your metoprolol  dosage to 100 mg and continue your current diuretic. Please monitor your blood pressure regularly to ensure it stays under control.  INSTRUCTIONS: Please follow up with the [facility] for your uterine pain evaluation and potential hysterectomy. Continue taking your medications as prescribed and monitor your blood pressure regularly. If you have any concerns or experience any new symptoms, please contact our office.                      Contains text generated by Abridge.                                 Contains text generated by Abridge.

## 2024-01-23 ENCOUNTER — Encounter: Payer: Self-pay | Admitting: Family Medicine

## 2024-01-23 NOTE — Telephone Encounter (Signed)
 Spoke with pt regarding referral and advised her that it was sent over to [facility] and she could see the information in her chart

## 2024-02-14 ENCOUNTER — Encounter (HOSPITAL_BASED_OUTPATIENT_CLINIC_OR_DEPARTMENT_OTHER): Payer: Self-pay

## 2024-02-14 ENCOUNTER — Encounter (HOSPITAL_BASED_OUTPATIENT_CLINIC_OR_DEPARTMENT_OTHER): Admitting: Obstetrics & Gynecology

## 2024-02-20 ENCOUNTER — Telehealth: Payer: Self-pay | Admitting: Family Medicine

## 2024-02-20 NOTE — Telephone Encounter (Unsigned)
 Copied from CRM [id number]. Topic: General - Other >> Feb 20, 2024 11:30 AM Gennette ORN wrote: Reason for CRM: Patient is calling to return [facility] call. She missed the call. Transferred call to [facility].

## 2024-02-20 NOTE — Telephone Encounter (Signed)
 Called patient and left a voicemail regarding scheduling screening mammogram.

## 2024-02-20 NOTE — Telephone Encounter (Signed)
 Patient returned my call regarding scheduling screening mammogram. Patient stated she is not interested at this time, she may schedule at her physical in November with Dr. Jodie.

## 2024-02-21 ENCOUNTER — Encounter (HOSPITAL_BASED_OUTPATIENT_CLINIC_OR_DEPARTMENT_OTHER): Payer: Self-pay | Admitting: Obstetrics & Gynecology

## 2024-02-21 ENCOUNTER — Other Ambulatory Visit (HOSPITAL_COMMUNITY)
Admission: RE | Admit: 2024-02-21 | Discharge: 2024-02-21 | Disposition: A | Discharge status: Home / Self Care | Source: Ambulatory Visit | Attending: Obstetrics & Gynecology | Admitting: Obstetrics & Gynecology

## 2024-02-21 ENCOUNTER — Ambulatory Visit (HOSPITAL_BASED_OUTPATIENT_CLINIC_OR_DEPARTMENT_OTHER): Admitting: Obstetrics & Gynecology

## 2024-02-21 VITALS — BP 145/98 | HR 68 | Ht 62.0 in | Wt 157.0 lb

## 2024-02-21 DIAGNOSIS — N898 Other specified noninflammatory disorders of vagina: Secondary | ICD-10-CM | POA: Diagnosis present

## 2024-02-21 DIAGNOSIS — R102 Pelvic and perineal pain: Secondary | ICD-10-CM | POA: Diagnosis not present

## 2024-02-21 DIAGNOSIS — N852 Hypertrophy of uterus: Secondary | ICD-10-CM

## 2024-02-21 NOTE — Progress Notes (Unsigned)
 GYNECOLOGY  VISIT  CC:   fibroids  HPI: 48 y.o. G0 Legally Separated Black or African American female here consult regarding fibroid uterus.  She has been seen in the past by Dr. Rosalva and was scheduled for surgery but had some issues with insurance.  Her primary care doctor is Dr. Jodie, who referred her.  Patient cycles are regular and flow typically last about 3 days.  It is not heavy.  Her biggest complaint is pain that occurs about a week before her cycle.  This continues into the cycle but is typically gone once her cycle ends.  She also has some bulk symptoms with pelvic pressure and bladder pressure.  I do not have any of her records but reviewed in care everywhere and could see a note from Dr. Rosalva reporting an ultrasound done October 26, 2023 showing a fibroid uterus with largest fibroid measuring 6.6 cm and the second largest measuring 3.7 cm.  I would like to get this actual report.  We discussed treatment options and she is most interested in a hysterectomy.  She does understand the pain does feel very related to her menstrual cycles but she could have endometriosis and there is no guarantee that the pain will be resolved after surgery.  With surgery, and if there is endometriosis present, there is a risk that she would need ovary removal as well.  Her overall desire is just for the pain to stop.  Last pap smear 03/01/2023 with ASCUS pap and negative HR HPV.   Past Medical History:  Diagnosis Date   Anemia    Asthma    GERD (gastroesophageal reflux disease)    Hypertension    Migraine headache     MEDS:   Current Outpatient Medications on File Prior to Visit  Medication Sig Dispense Refill   EPIPEN 2-PAK 0.3 MG/0.3ML SOAJ injection inject as directed WITH BEE STING  0   HAWTHORN BERRY PO Take by mouth.     metoprolol  succinate (TOPROL -XL) 100 MG 24 hr tablet Take 1 tablet (100 mg total) by mouth daily. 90 tablet 3   triamterene -hydrochlorothiazide  (MAXZIDE-25) 37.5-25 MG tablet  Take 1 tablet by mouth daily. 90 tablet 3   [DISCONTINUED] albuterol  (VENTOLIN  HFA) 108 (90 Base) MCG/ACT inhaler Inhale 2 puffs into the lungs every 6 (six) hours as needed for wheezing or shortness of breath. 18 g 3   No current facility-administered medications on file prior to visit.    ALLERGIES: Bee venom, Other, and Shellfish allergy  SH: Separated, former smoker  Review of Systems  Constitutional: Negative.   Genitourinary:        Pelvic pain    PHYSICAL EXAMINATION:    BP (!) 145/98   Pulse 68   Ht 5' 2 (1.575 m)   Wt 157 lb (71.2 kg)   LMP 02/09/2024 (Exact Date)   SpO2 100%   BMI 28.72 kg/m     General appearance: alert, cooperative and appears stated age Abdomen: soft, non-tender; bowel sounds normal; no masses,  no organomegaly Lymph:  no inguinal LAD noted  Pelvic: External genitalia:  no lesions              Urethra:  normal appearing urethra with no masses, tenderness or lesions              Bartholins and Skenes: normal                 Vagina: normal mucosa without prolapse or lesions, clumpy white  discharge with odor noted              Cervix: no lesions              Bimanual Exam:  Uterus:  Enlarged about 14 cm.  Does feel bulky and is difficult to lift out of the pelvis due to the posterior enlargement.  The anterior part of the uterus does feel mobile.              Adnexa: no mass, fullness, tenderness              Rectovaginal: Patient declines  Chaperone was present for exam.  Assessment/Plan: 1. Pelvic pain (Primary) - Release of record for the ultrasound from [facility] has been requested.  - Will move toward surgical scheduling  2. Enlarged uterus  3. Vaginal discharge - Cervicovaginal ancillary only( [place])

## 2024-02-22 ENCOUNTER — Ambulatory Visit (HOSPITAL_BASED_OUTPATIENT_CLINIC_OR_DEPARTMENT_OTHER): Payer: Self-pay | Admitting: Obstetrics & Gynecology

## 2024-02-22 DIAGNOSIS — B9689 Other specified bacterial agents as the cause of diseases classified elsewhere: Secondary | ICD-10-CM

## 2024-02-22 LAB — CERVICOVAGINAL ANCILLARY ONLY
Bacterial Vaginitis (gardnerella): POSITIVE — AB
Candida Glabrata: NEGATIVE
Candida Vaginitis: NEGATIVE
Comment: NEGATIVE
Comment: NEGATIVE
Comment: NEGATIVE

## 2024-02-22 MED ORDER — METRONIDAZOLE 500 MG PO TABS: 500.0000 mg | ORAL_TABLET | Freq: Two times a day (BID) | ORAL | 0 refills | Status: DC

## 2024-03-05 ENCOUNTER — Encounter (HOSPITAL_BASED_OUTPATIENT_CLINIC_OR_DEPARTMENT_OTHER): Payer: Self-pay | Admitting: Obstetrics & Gynecology

## 2024-03-20 ENCOUNTER — Encounter (HOSPITAL_BASED_OUTPATIENT_CLINIC_OR_DEPARTMENT_OTHER): Payer: Self-pay | Admitting: Obstetrics & Gynecology

## 2024-04-01 ENCOUNTER — Encounter (HOSPITAL_BASED_OUTPATIENT_CLINIC_OR_DEPARTMENT_OTHER): Payer: Self-pay | Admitting: Obstetrics & Gynecology

## 2024-04-01 ENCOUNTER — Encounter: Payer: Self-pay | Admitting: Family Medicine

## 2024-04-10 ENCOUNTER — Encounter (HOSPITAL_BASED_OUTPATIENT_CLINIC_OR_DEPARTMENT_OTHER): Payer: Self-pay

## 2024-04-16 ENCOUNTER — Other Ambulatory Visit: Payer: Self-pay | Admitting: Family Medicine

## 2024-04-16 DIAGNOSIS — I10 Essential (primary) hypertension: Secondary | ICD-10-CM

## 2024-04-18 NOTE — Telephone Encounter (Signed)
 Discontinued 09/17/15.  Requested Prescriptions  Pending Prescriptions Disp Refills   hydrochlorothiazide  (HYDRODIURIL ) 25 MG tablet [Pharmacy Med Name: hydroCHLOROthiazide  25 MG Oral Tablet] 30 tablet 0    Sig: TAKE ONE-HALF TABLET BY MOUTH ONCE DAILY     Cardiovascular: Diuretics - Thiazide Failed - 04/18/2024 11:59 AM      Failed - Last BP in normal range    BP Readings from Last 1 Encounters:  02/21/24 (!) 145/98         Failed - Valid encounter within last 6 months    Recent Outpatient Visits   None            Passed - Cr in normal range and within 180 days    Creat  Date Value Ref Range Status  09/17/2015 0.96 0.50 - 1.10 mg/dL Final   Creatinine, Ser  Date Value Ref Range Status  10/22/2023 0.98 0.44 - 1.00 mg/dL Final         Passed - K in normal range and within 180 days    Potassium  Date Value Ref Range Status  10/22/2023 3.5 3.5 - 5.1 mmol/L Final         Passed - Na in normal range and within 180 days    Sodium  Date Value Ref Range Status  10/22/2023 137 135 - 145 mmol/L Final

## 2024-05-29 ENCOUNTER — Encounter: Payer: Self-pay | Admitting: Family Medicine

## 2024-05-29 ENCOUNTER — Ambulatory Visit: Admitting: Family Medicine

## 2024-05-29 VITALS — BP 156/101 | HR 78 | Temp 99.0°F | Ht 62.0 in | Wt 155.0 lb

## 2024-05-29 DIAGNOSIS — Z0001 Encounter for general adult medical examination with abnormal findings: Secondary | ICD-10-CM | POA: Diagnosis not present

## 2024-05-29 DIAGNOSIS — R102 Pelvic and perineal pain unspecified side: Secondary | ICD-10-CM | POA: Diagnosis not present

## 2024-05-29 DIAGNOSIS — Z1231 Encounter for screening mammogram for malignant neoplasm of breast: Secondary | ICD-10-CM

## 2024-05-29 DIAGNOSIS — Z1159 Encounter for screening for other viral diseases: Secondary | ICD-10-CM | POA: Diagnosis not present

## 2024-05-29 DIAGNOSIS — I10 Essential (primary) hypertension: Secondary | ICD-10-CM | POA: Diagnosis not present

## 2024-05-29 DIAGNOSIS — D259 Leiomyoma of uterus, unspecified: Secondary | ICD-10-CM | POA: Diagnosis not present

## 2024-05-29 DIAGNOSIS — H8109 Meniere's disease, unspecified ear: Secondary | ICD-10-CM

## 2024-05-29 LAB — COMPREHENSIVE METABOLIC PANEL WITH GFR
ALT: 13 U/L (ref 0–35)
AST: 18 U/L (ref 0–37)
Albumin: 4.8 g/dL (ref 3.5–5.2)
Alkaline Phosphatase: 35 U/L — ABNORMAL LOW (ref 39–117)
BUN: 7 mg/dL (ref 6–23)
CO2: 26 meq/L (ref 19–32)
Calcium: 10.1 mg/dL (ref 8.4–10.5)
Chloride: 102 meq/L (ref 96–112)
Creatinine, Ser: 0.9 mg/dL (ref 0.40–1.20)
GFR: 76.9 mL/min (ref >60.00)
Glucose, Bld: 79 mg/dL (ref 70–99)
Potassium: 3.1 meq/L — ABNORMAL LOW (ref 3.5–5.1)
Sodium: 135 meq/L (ref 135–145)
Total Bilirubin: 0.4 mg/dL (ref 0.2–1.2)
Total Protein: 7.8 g/dL (ref 6.0–8.3)

## 2024-05-29 LAB — CBC WITH DIFFERENTIAL/PLATELET
Basophils Absolute: 0.1 K/uL (ref 0.0–0.1)
Basophils Relative: 1.2 % (ref 0.0–3.0)
Eosinophils Absolute: 0.2 K/uL (ref 0.0–0.7)
Eosinophils Relative: 3.5 % (ref 0.0–5.0)
HCT: 41.1 % (ref 36.0–46.0)
Hemoglobin: 13.8 g/dL (ref 12.0–15.0)
Lymphocytes Relative: 36.7 % (ref 12.0–46.0)
Lymphs Abs: 2.4 K/uL (ref 0.7–4.0)
MCHC: 33.6 g/dL (ref 30.0–36.0)
MCV: 92.2 fl (ref 78.0–100.0)
Monocytes Absolute: 0.4 K/uL (ref 0.1–1.0)
Monocytes Relative: 5.8 % (ref 3.0–12.0)
Neutro Abs: 3.4 K/uL (ref 1.4–7.7)
Neutrophils Relative %: 52.8 % (ref 43.0–77.0)
Platelets: 306 K/uL (ref 150.0–400.0)
RBC: 4.46 Mil/uL (ref 3.87–5.11)
RDW: 13.8 % (ref 11.5–15.5)
WBC: 6.5 K/uL (ref 4.0–10.5)

## 2024-05-29 LAB — LIPID PANEL
Cholesterol: 197 mg/dL (ref 0–200)
HDL: 65.2 mg/dL (ref >39.00)
LDL Cholesterol: 115 mg/dL — ABNORMAL HIGH (ref 0–99)
NonHDL: 132.26
Total CHOL/HDL Ratio: 3
Triglycerides: 87 mg/dL (ref 0.0–149.0)
VLDL: 17.4 mg/dL (ref 0.0–40.0)

## 2024-05-29 LAB — TSH: TSH: 1.99 u[IU]/mL (ref 0.35–5.50)

## 2024-05-29 MED ORDER — AMLODIPINE BESYLATE 5 MG PO TABS: 5.0000 mg | ORAL_TABLET | Freq: Every day | ORAL | 3 refills | Status: AC

## 2024-05-29 NOTE — Patient Instructions (Signed)
 Please return in 6 months for hypertension follow up.   I will release your lab results to you on your MyChart account with further instructions. You may see the results before I do, but when I review them I will send you a message with my report or have my assistant call you if things need to be discussed. Please reply to my message with any questions. Thank you!   Please call the office checked below to schedule your appointment for your mammogram and/or bone density screen (the checked studies were ordered): [x]   Mammogram  []   Bone Density  [x]   [facility]     [address] [place], [place]        [phone]         []   [facility]  [address] [place], [place]  [phone]   If you have any questions or concerns, please don't hesitate to send me a message via MyChart or call the office at [phone]. Thank you for visiting with us  today! It's our pleasure caring for you.   We added amlodipine  5 mg daily to be taken in addition to your metoprolol  and Maxide.  This should get your blood pressure to goal.

## 2024-05-29 NOTE — Progress Notes (Signed)
 Subjective  Chief Complaint  Patient presents with   Annual Exam    Pt here for Annual Exam and is currently niot fasting fasting. Mammo has not been scheduled yet   Hypertension    HPI: Jessica Hale is a 48 y.o. female who presents to [facility] [place] today for a Female Wellness Visit. She also has the concerns and/or needs as listed above in the chief complaint. These will be addressed in addition to the Health Maintenance Visit.   Wellness Visit: annual visit with health maintenance review and exam  HM: sees gyn. Pap current (ascus and due f/u 2027). Needs mammo; can schedule. CRC screen current due again in 2029. Healthy and doing well overall. Declines flu vaccine  Chronic disease f/u and/or acute problem visit: (deemed necessary to be done in addition to the wellness visit): Discussed the use of AI scribe software for clinical note transcription with the patient, who gave verbal consent to proceed.  History of Present Illness Jessica Hale is a 48 year old female with hypertension who presents for blood pressure management and follow-up.  Hypertension - Ongoing elevated blood pressure with home readings typically ranging from 145/89 to 145/99 mmHg - Currently taking a diuretic (maxzide) and a beta blocker (Toprol  xl 100 daily) for blood pressure control - Feeling well. Taking medications w/o adverse effects. No symptoms of CHF, angina; no palpitations, sob, cp or lower extremity edema. Compliant with meds.   Uterine fibroids - Awaiting hysterectomy.  - has persistent intermittent pain but manageable. No new sxs - Arranged for FMLA coverage in anticipation of the procedure  Menieres' is stable w/o active sxs on diuretic. No hearing loss      Assessment  1. Encounter for well adult exam with abnormal findings   2. Essential hypertension   3. Cochlear hydrops, unspecified laterality   4. Need for hepatitis C screening test   5. Screening  mammogram for breast cancer   6. Uterine leiomyoma, unspecified location   7. Pelvic pain      Plan  Female Wellness Visit: Age appropriate Health Maintenance and Prevention measures were discussed with patient. Included topics are cancer screening recommendations, ways to keep healthy (see AVS) including dietary and exercise recommendations, regular eye and dental care, use of seat belts, and avoidance of moderate alcohol use and tobacco use. Pt to schedule mammogram at breast center BMI: discussed patient's BMI and encouraged positive lifestyle modifications to help get to or maintain a target BMI. HM needs and immunizations were addressed and ordered. See below for orders. See HM and immunization section for updates.declines Routine labs and screening tests ordered including cmp, cbc and lipids where appropriate. Discussed recommendations regarding Vit D and calcium supplementation (see AVS)  Chronic disease management visit and/or acute problem visit: Assessment and Plan Assessment & Plan Adult Wellness Visit Routine adult wellness visit with no acute complaints. Blood pressure is elevated at 150/100 mmHg. Current medications include a diuretic and a beta blocker, both at maximum doses. Discussed the need for additional medication to achieve target blood pressure of 120s/70s to prevent long-term complications such as heart, brain, or kidney damage. - Added amlodipine  to current medication regimen to better control blood pressure. - Ordered blood work to monitor health status. - Scheduled follow-up in six months to monitor blood pressure and overall health.  Essential hypertension, difficult to control Hypertension is difficult to control with current regimen of diuretic and beta blocker. Blood pressure readings at  home range from 145/89 to 145/99 mmHg. Discussed the importance of achieving target blood pressure to prevent complications. Amlodipine  was previously ineffective alone but may  provide additional control when combined with current medications. - Added amlodipine  5 to current medication regimen to better control blood pressure. - check renal function and lytes and lipid screen today (nonfasting)  Pelvic pain due to uterine fibroids for hysterectomy. Ok for surgical clearance. Will get bp under better control prior.   Screening mammogram for malignant neoplasm of breast Screening mammogram is due. Discussed options for scheduling the mammogram at the breast center or OB office. Preference for breast center to ensure results are accessible through MyChart. - Ordered screening mammogram and provided phone number of the breast center for scheduling.    Follow up: 6 mo to recheck bp  Orders Placed This Encounter  Procedures   MM DIGITAL SCREENING BILATERAL   CBC with Differential/Platelet   Comprehensive metabolic panel with GFR   Lipid panel   TSH   Hepatitis C antibody   Meds ordered this encounter  Medications   amLODipine  (NORVASC ) 5 MG tablet    Sig: Take 1 tablet (5 mg total) by mouth daily.    Dispense:  90 tablet    Refill:  3      Body mass index is 28.35 kg/m. Wt Readings from Last 3 Encounters:  05/29/24 155 lb (70.3 kg)  02/21/24 157 lb (71.2 kg)  01/10/24 159 lb 6.4 oz (72.3 kg)     Patient Active Problem List   Diagnosis Date Noted   Cervical radiculopathy 04/20/2018    Priority: High    MRI C-spine 03/31/18 IMPRESSION: 1. Small C5-6 and C6-7 disc protrusions with annular fissures. No fracture or malalignment. 2. Mild canal stenosis C5-6.  No neural foraminal narrowing.      Meniere's disease (cochlear hydrops) 01/25/2013    Priority: High   Atypical migraine 05/02/2011    Priority: High   Essential hypertension 02/07/2007    Priority: High   Adjustment disorder with anxiety 07/07/2021    Priority: Medium     Separation / divorce 2021-2023; counseling and behavioral mgt.     Carpal tunnel syndrome on right 08/01/2014     Priority: Medium     B corticosteroid injection 08/17/2018    GERD 10/16/2007    Priority: Medium    Mild intermittent asthma 02/07/2007    Priority: Medium    Chronic periscapular pain on right side 01/01/2018    Priority: Low    Intermittent. Works job in holiday representative.    Chronic right-sided low back pain without sciatica 08/01/2014    Priority: Low   Allergic rhinitis 04/10/2012    Priority: Low   Atypical squamous cell changes of undetermined significance (ASCUS) on cervical cytology with negative high risk human papilloma virus (HPV) test result 10/18/2023    02/2023: repeat in 3 years: 02/2026 per gyn.    Health Maintenance  Topic Date Due   Hepatitis C Screening  Never done   Hepatitis B Vaccines 19-59 Average Risk (1 of 3 - 19+ 3-dose series) Never done   Mammogram  Never done   Pneumococcal Vaccine (2 of 2 - PCV) 10/17/2024 (Originally 04/17/2002)   Cervical Cancer Screening (Pap smear)  02/28/2026   Colonoscopy  04/12/2028   DTaP/Tdap/Td (4 - Td or Tdap) 09/30/2031   HIV Screening  Completed   HPV VACCINES  Aged Out   Meningococcal B Vaccine  Aged Out   Influenza Vaccine  Discontinued  COVID-19 Vaccine  Discontinued   Immunization History  Administered Date(s) Administered   Influenza Split 04/28/2011   Influenza Whole 08/05/2009   Influenza,inj,Quad PF,6+ Mos 04/16/2014, 08/19/2015, 06/02/2017, 04/20/2018, 05/06/2019   Influenza-Unspecified 04/20/2018, 03/01/2019   Pneumococcal Polysaccharide-23 04/17/2001   Td 03/18/1997, 08/05/2009   Tdap 09/29/2021   We updated and reviewed the patient's past history in detail and it is documented below. Allergies: Patient is allergic to bee venom, other, and shellfish allergy. Past Medical History Patient  has a past medical history of Anemia, Asthma, Fibroids, GERD (gastroesophageal reflux disease), Hypertension, and Migraine headache. Past Surgical History Patient  has a past surgical history that includes Knee  surgery. Family History: Patient family history includes Heart attack in her maternal grandmother; Hypertension in her maternal grandmother; Lung cancer in her maternal aunt. Social History:  Patient  reports that she quit smoking about 5 years ago. Her smoking use included cigarettes. She started smoking about 13 years ago. She has a 4 pack-year smoking history. She has never used smokeless tobacco. She reports current alcohol use. She reports that she does not currently use drugs after having used the following drugs: Marijuana.  Review of Systems: Constitutional: negative for fever or malaise Ophthalmic: negative for photophobia, double vision or loss of vision Cardiovascular: negative for chest pain, dyspnea on exertion, or new LE swelling Respiratory: negative for SOB or persistent cough Gastrointestinal: negative for abdominal pain, change in bowel habits or melena Genitourinary: negative for dysuria or gross hematuria, no abnormal uterine bleeding or disharge Musculoskeletal: negative for new gait disturbance or muscular weakness Integumentary: negative for new or persistent rashes, no breast lumps Neurological: negative for TIA or stroke symptoms Psychiatric: negative for SI or delusions Allergic/Immunologic: negative for hives  Patient Care Team    Relationship Specialty Notifications Start End  Jodie Lavern CROME, MD PCP - General Family Medicine  05/22/19   Tobie Tonita POUR, DO Consulting Physician Neurology  06/12/19   Karis Clunes, MD Consulting Physician Otolaryngology  06/24/19   Rosalva Sawyer, MD Consulting Physician Obstetrics and Gynecology  01/10/24     Objective  Vitals: BP (!) 156/101   Pulse 78   Temp 99 F (37.2 C)   Ht 5' 2 (1.575 m)   Wt 155 lb (70.3 kg)   SpO2 99%   BMI 28.35 kg/m  General:  Well developed, well nourished, no acute distress  Psych:  Alert and orientedx3,normal mood and affect HEENT:  Normocephalic, atraumatic, non-icteric sclera,  supple neck  without adenopathy, mass or thyromegaly Cardiovascular:  Normal S1, S2, RRR without gallop, rub or murmur Respiratory:  Good breath sounds bilaterally, CTAB with normal respiratory effort Gastrointestinal: normal bowel sounds, soft, non-tender, no noted masses. No HSM MSK: extremities without edema, joints without erythema or swelling Neurologic:    Mental status is normal.  Gross motor and sensory exams are normal.  No tremor  Commons side effects, risks, benefits, and alternatives for medications and treatment plan prescribed today were discussed, and the patient expressed understanding of the given instructions. Patient is instructed to call or message via MyChart if he/she has any questions or concerns regarding our treatment plan. No barriers to understanding were identified. We discussed Red Flag symptoms and signs in detail. Patient expressed understanding regarding what to do in case of urgent or emergency type symptoms.  Medication list was reconciled, printed and provided to the patient in AVS. Patient instructions and summary information was reviewed with the patient as documented in the AVS. This note  was prepared with assistance of Conservation officer, historic buildings. Occasional wrong-word or sound-a-like substitutions may have occurred due to the inherent limitations of voice recognition software

## 2024-05-30 LAB — HEPATITIS C ANTIBODY: Hepatitis C Ab: NONREACTIVE

## 2024-05-31 ENCOUNTER — Other Ambulatory Visit: Payer: Self-pay

## 2024-05-31 ENCOUNTER — Ambulatory Visit: Payer: Self-pay | Admitting: Family Medicine

## 2024-05-31 DIAGNOSIS — E876 Hypokalemia: Secondary | ICD-10-CM

## 2024-05-31 MED ORDER — POTASSIUM CHLORIDE CRYS ER 10 MEQ PO TBCR: 10.0000 meq | EXTENDED_RELEASE_TABLET | Freq: Every day | ORAL | 1 refills | Status: AC

## 2024-05-31 NOTE — Progress Notes (Signed)
 Please call patient:labs look good overall; just need to add potassium due to bp pill.. please order kcl 10meq po daily. thanks  The 10-year ASCVD risk score (Arnett DK, et al., 2019) is: 4.6%   Values used to calculate the score:     Age: 48 years     Clincally relevant sex: Female     Is Non-Hispanic African American: Yes     Diabetic: No     Tobacco smoker: No     Systolic Blood Pressure: 156 mmHg     Is BP treated: Yes     HDL Cholesterol: 65.2 mg/dL     Total Cholesterol: 197 mg/dL

## 2024-06-19 ENCOUNTER — Encounter (HOSPITAL_BASED_OUTPATIENT_CLINIC_OR_DEPARTMENT_OTHER): Payer: Self-pay | Admitting: Obstetrics & Gynecology

## 2024-06-24 ENCOUNTER — Ambulatory Visit

## 2024-06-24 ENCOUNTER — Telehealth (HOSPITAL_BASED_OUTPATIENT_CLINIC_OR_DEPARTMENT_OTHER): Payer: Self-pay

## 2024-06-24 NOTE — Telephone Encounter (Signed)
 Please see mychart message dated 06/19/2025.

## 2024-06-24 NOTE — Telephone Encounter (Signed)
 Left message to call Nayab Aten at [phone].

## 2024-06-28 ENCOUNTER — Ambulatory Visit: Admitting: Family Medicine

## 2024-06-28 ENCOUNTER — Encounter: Payer: Self-pay | Admitting: Family Medicine

## 2024-06-28 VITALS — BP 132/80 | Ht 62.5 in | Wt 160.0 lb

## 2024-06-28 DIAGNOSIS — E876 Hypokalemia: Secondary | ICD-10-CM | POA: Diagnosis not present

## 2024-06-28 DIAGNOSIS — I10 Essential (primary) hypertension: Secondary | ICD-10-CM

## 2024-06-29 ENCOUNTER — Encounter: Payer: Self-pay | Admitting: Family Medicine

## 2024-06-29 NOTE — Progress Notes (Signed)
 Assessment & Plan:   Assessment & Plan Essential hypertension At goal. Tolerating medications without noted side-effects. Last labs with hypokalemia but daily potassium added to regimen. Will continue medications and update GYN for potential surgery.   BP Readings from Last 3 Encounters:  06/28/24 132/80  05/29/24 (!) 156/101  02/21/24 (!) 145/98   Lab Results  Component Value Date   NA 135 05/29/2024   K 3.1 (L) 05/29/2024   CO2 26 05/29/2024   BUN 7 05/29/2024   CREATININE 0.90 05/29/2024   CALCIUM 10.1 05/29/2024   GLUCOSE 79 05/29/2024   Current Outpatient Medications (Cardiovascular):    amLODipine  (NORVASC ) 5 MG tablet, Take 1 tablet (5 mg total) by mouth daily.   metoprolol  succinate (TOPROL -XL) 100 MG 24 hr tablet, Take 1 tablet (100 mg total) by mouth daily.   triamterene -hydrochlorothiazide  (MAXZIDE-25) 37.5-25 MG tablet, Take 1 tablet by mouth daily.   potassium chloride  (KLOR-CON  M) 10 MEQ tablet, Take 1 tablet (10 mEq total) by mouth daily.  If BP rises, will increase Amlodipine . After surgery, we will focus on HTN management more aggressively.     Geni Shutter, DO, MS, FAAFP, Dipl. [facility] [address] [place], [address] Dept: [phone] Dept Fax: [phone]  Subjective:   Patient is well-known to me from previous care setting and is establishing care in this system with me as PCP. Prior records reviewed when available. Chart updated today with reconciliation of problem list, medications, allergies, and relevant history. Preventive care and chronic disease status reviewed. Portions of historical chart may remain incomplete; will update on an ongoing basis as clinically indicated.  Review of Systems: Negative, with the exception of above mentioned in HPI. Current Medications[1]   Objective:   BP 132/80 (BP Location: Right Arm, Cuff Size: Normal)   Ht 5' 2.5 (1.588 m)   Wt 160 lb (72.6 kg)    BMI 28.80 kg/m   BP Readings from Last 3 Encounters:  06/28/24 132/80  05/29/24 (!) 156/101  02/21/24 (!) 145/98   Physical Exam Constitutional:      General: She is not in acute distress.    Appearance: She is well-developed.  HENT:     Head: Normocephalic and atraumatic.  Eyes:     Conjunctiva/sclera: Conjunctivae normal.  Cardiovascular:     Rate and Rhythm: Normal rate and regular rhythm.     Heart sounds: Normal heart sounds.  Pulmonary:     Effort: Pulmonary effort is normal.     Breath sounds: Normal breath sounds.  Neurological:     General: No focal deficit present.     Mental Status: She is alert.  Psychiatric:        Behavior: Behavior normal.    Results for orders placed or performed in visit on 05/29/24  CBC with Differential/Platelet   Collection Time: 05/29/24 11:11 AM  Result Value Ref Range   WBC 6.5 4.0 - 10.5 K/uL   RBC 4.46 3.87 - 5.11 Mil/uL   Hemoglobin 13.8 12.0 - 15.0 g/dL   HCT 58.8 63.9 - 53.9 %   MCV 92.2 78.0 - 100.0 fl   MCHC 33.6 30.0 - 36.0 g/dL   RDW 86.1 88.4 - 84.4 %   Platelets 306.0 150.0 - 400.0 K/uL   Neutrophils Relative % 52.8 43.0 - 77.0 %   Lymphocytes Relative 36.7 12.0 - 46.0 %   Monocytes Relative 5.8 3.0 - 12.0 %   Eosinophils Relative 3.5 0.0 - 5.0 %  Basophils Relative 1.2 0.0 - 3.0 %   Neutro Abs 3.4 1.4 - 7.7 K/uL   Lymphs Abs 2.4 0.7 - 4.0 K/uL   Monocytes Absolute 0.4 0.1 - 1.0 K/uL   Eosinophils Absolute 0.2 0.0 - 0.7 K/uL   Basophils Absolute 0.1 0.0 - 0.1 K/uL  Comprehensive metabolic panel with GFR   Collection Time: 05/29/24 11:11 AM  Result Value Ref Range   Sodium 135 135 - 145 mEq/L   Potassium 3.1 (L) 3.5 - 5.1 mEq/L   Chloride 102 96 - 112 mEq/L   CO2 26 19 - 32 mEq/L   Glucose, Bld 79 70 - 99 mg/dL   BUN 7 6 - 23 mg/dL   Creatinine, Ser 9.09 0.40 - 1.20 mg/dL   Total Bilirubin 0.4 0.2 - 1.2 mg/dL   Alkaline Phosphatase 35 (L) 39 - 117 U/L   AST 18 0 - 37 U/L   ALT 13 0 - 35 U/L   Total  Protein 7.8 6.0 - 8.3 g/dL   Albumin 4.8 3.5 - 5.2 g/dL   GFR 23.09 >39.99 mL/min   Calcium 10.1 8.4 - 10.5 mg/dL  Lipid panel   Collection Time: 05/29/24 11:11 AM  Result Value Ref Range   Cholesterol 197 0 - 200 mg/dL   Triglycerides 12.9 0.0 - 149.0 mg/dL   HDL 34.79 >60.99 mg/dL   VLDL 82.5 0.0 - 59.9 mg/dL   LDL Cholesterol 884 (H) 0 - 99 mg/dL   Total CHOL/HDL Ratio 3    NonHDL 132.26   TSH   Collection Time: 05/29/24 11:11 AM  Result Value Ref Range   TSH 1.99 0.35 - 5.50 uIU/mL  Hepatitis C antibody   Collection Time: 05/29/24 11:11 AM  Result Value Ref Range   Hepatitis C Ab NON-REACTIVE NON-REACTIVE      [1]  Current Outpatient Medications:    amLODipine  (NORVASC ) 5 MG tablet, Take 1 tablet (5 mg total) by mouth daily., Disp: 90 tablet, Rfl: 3   EPIPEN 2-PAK 0.3 MG/0.3ML SOAJ injection, inject as directed WITH BEE STING, Disp: , Rfl: 0   HAWTHORN BERRY PO, Take by mouth., Disp: , Rfl:    metoprolol  succinate (TOPROL -XL) 100 MG 24 hr tablet, Take 1 tablet (100 mg total) by mouth daily., Disp: 90 tablet, Rfl: 3   potassium chloride  (KLOR-CON  M) 10 MEQ tablet, Take 1 tablet (10 mEq total) by mouth daily., Disp: 90 tablet, Rfl: 1   triamterene -hydrochlorothiazide  (MAXZIDE-25) 37.5-25 MG tablet, Take 1 tablet by mouth daily., Disp: 90 tablet, Rfl: 3

## 2024-06-29 NOTE — Assessment & Plan Note (Addendum)
 At goal. Tolerating medications without noted side-effects. Last labs with hypokalemia but daily potassium added to regimen. Will continue medications and update GYN for potential surgery.   BP Readings from Last 3 Encounters:  06/28/24 132/80  05/29/24 (!) 156/101  02/21/24 (!) 145/98   Lab Results  Component Value Date   NA 135 05/29/2024   K 3.1 (L) 05/29/2024   CO2 26 05/29/2024   BUN 7 05/29/2024   CREATININE 0.90 05/29/2024   CALCIUM 10.1 05/29/2024   GLUCOSE 79 05/29/2024   Current Outpatient Medications (Cardiovascular):    amLODipine  (NORVASC ) 5 MG tablet, Take 1 tablet (5 mg total) by mouth daily.   metoprolol  succinate (TOPROL -XL) 100 MG 24 hr tablet, Take 1 tablet (100 mg total) by mouth daily.   triamterene -hydrochlorothiazide  (MAXZIDE-25) 37.5-25 MG tablet, Take 1 tablet by mouth daily.   potassium chloride  (KLOR-CON  M) 10 MEQ tablet, Take 1 tablet (10 mEq total) by mouth daily.  If BP rises, will increase Amlodipine . After surgery, we will focus on HTN management more aggressively.

## 2024-07-19 ENCOUNTER — Ambulatory Visit

## 2024-07-22 ENCOUNTER — Ambulatory Visit (HOSPITAL_BASED_OUTPATIENT_CLINIC_OR_DEPARTMENT_OTHER): Admitting: Obstetrics & Gynecology

## 2024-07-22 ENCOUNTER — Encounter (HOSPITAL_BASED_OUTPATIENT_CLINIC_OR_DEPARTMENT_OTHER): Payer: Self-pay | Admitting: Obstetrics & Gynecology

## 2024-07-22 VITALS — BP 136/97 | HR 71 | Ht 62.5 in | Wt 158.8 lb

## 2024-07-22 DIAGNOSIS — R102 Pelvic and perineal pain unspecified side: Secondary | ICD-10-CM

## 2024-07-22 DIAGNOSIS — D251 Intramural leiomyoma of uterus: Secondary | ICD-10-CM | POA: Diagnosis not present

## 2024-07-22 DIAGNOSIS — R87610 Atypical squamous cells of undetermined significance on cytologic smear of cervix (ASC-US): Secondary | ICD-10-CM | POA: Diagnosis not present

## 2024-07-22 DIAGNOSIS — I10 Essential (primary) hypertension: Secondary | ICD-10-CM

## 2024-07-22 NOTE — Progress Notes (Unsigned)
" ° °  GYNECOLOGY  VISIT  CC:   No chief complaint on file.   HPI: 49 y.o. G1P0010 Legally Separated Black or African American female here for preop.  No LMP recorded.  Past Medical History:  Diagnosis Date   Anemia    Asthma    Fibroids    GERD (gastroesophageal reflux disease)    Hypertension    Migraine headache     MEDS:  Reviewed in EPIC  ALLERGIES: Bee venom, Other, and Shellfish allergy  SH:  ***  ROS  PHYSICAL EXAMINATION:    There were no vitals taken for this visit.    General appearance: alert, cooperative and appears stated age Neck: no adenopathy, supple, symmetrical, trachea midline and thyroid  {CHL AMB PHY EX THYROID  NORM [phone]::normal to inspection and palpation} CV:  {Exam; heart brief:31539} Lungs:  {pe lungs ob:314451} Breasts: {Exam; breast:13139::normal appearance, no masses or tenderness} Abdomen: soft, non-tender; bowel sounds normal; no masses,  no organomegaly Lymph:  no inguinal LAD noted  Pelvic: External genitalia:  no lesions              Urethra:  normal appearing urethra with no masses, tenderness or lesions              Bartholins and Skenes: normal                 Vagina: {exam; pelvic vaginal:30846}              Cervix: {CHL AMB PHY EX CERVIX NORM DEFAULT:[phone]::no lesions}              Bimanual Exam:  Uterus:  {CHL AMB PHY EX UTERUS NORM DEFAULT:[phone]::normal size, contour, position, consistency, mobility, non-tender}              Adnexa: {CHL AMB PHY EX ADNEXA NO MASS [phone]::no mass, fullness, tenderness}              Rectovaginal: {yes no:[id number]}.  Confirms.              Anus:  normal sphincter tone, no lesions  Chaperone was present for exam.  Assessment/Plan: There are no diagnoses linked to this encounter.   "

## 2024-07-23 ENCOUNTER — Encounter: Admitting: Family Medicine

## 2024-07-24 ENCOUNTER — Encounter (HOSPITAL_BASED_OUTPATIENT_CLINIC_OR_DEPARTMENT_OTHER): Payer: Self-pay | Admitting: Obstetrics & Gynecology

## 2024-07-25 ENCOUNTER — Other Ambulatory Visit (HOSPITAL_BASED_OUTPATIENT_CLINIC_OR_DEPARTMENT_OTHER): Payer: Self-pay

## 2024-07-25 DIAGNOSIS — Z1239 Encounter for other screening for malignant neoplasm of breast: Secondary | ICD-10-CM

## 2024-07-30 ENCOUNTER — Inpatient Hospital Stay
Admission: RE | Admit: 2024-07-30 | Discharge: 2024-07-30 | Attending: Obstetrics & Gynecology | Admitting: Obstetrics & Gynecology

## 2024-07-30 DIAGNOSIS — Z1239 Encounter for other screening for malignant neoplasm of breast: Secondary | ICD-10-CM

## 2024-08-20 ENCOUNTER — Telehealth: Payer: Self-pay

## 2024-08-20 NOTE — Telephone Encounter (Signed)
 I called patient to see if she's available for surgery w/ Dr. Cleotilde on 09/17/24 at [facility] at 0730. I left a voicemail requesting a call back to schedule.

## 2024-08-22 ENCOUNTER — Telehealth (HOSPITAL_BASED_OUTPATIENT_CLINIC_OR_DEPARTMENT_OTHER): Payer: Self-pay

## 2024-08-22 ENCOUNTER — Encounter (HOSPITAL_BASED_OUTPATIENT_CLINIC_OR_DEPARTMENT_OTHER): Payer: Self-pay

## 2024-08-22 NOTE — Telephone Encounter (Signed)
 Spoke with patient. Patient states that she will need a letter for her job stating that she is having surgery on 10/22/2024 and the time frame she will need to be out of work. Letter written stating that she will need to be out of work from 10/22/2024 to 12/03/2024. Letter sent via MyChart to patient.

## 2024-08-22 NOTE — Telephone Encounter (Signed)
 Left message to call Mercede Rollo at [phone].  Patient needs 1 and 6 week post op appointments scheduled.

## 2024-09-26 ENCOUNTER — Ambulatory Visit: Admitting: Family Medicine

## 2024-09-30 ENCOUNTER — Ambulatory Visit: Admitting: Family Medicine

## 2024-10-22 ENCOUNTER — Ambulatory Visit (HOSPITAL_COMMUNITY): Admit: 2024-10-22 | Admitting: Obstetrics & Gynecology

## 2024-10-30 ENCOUNTER — Ambulatory Visit (HOSPITAL_BASED_OUTPATIENT_CLINIC_OR_DEPARTMENT_OTHER): Payer: Self-pay | Admitting: Obstetrics & Gynecology

## 2024-11-27 ENCOUNTER — Ambulatory Visit: Admitting: Family Medicine

## 2024-12-04 ENCOUNTER — Ambulatory Visit (HOSPITAL_BASED_OUTPATIENT_CLINIC_OR_DEPARTMENT_OTHER): Payer: Self-pay | Admitting: Obstetrics & Gynecology

## 2025-06-04 ENCOUNTER — Encounter: Admitting: Family Medicine
